# Patient Record
Sex: Female | Born: 1980 | Race: White | Hispanic: No | Marital: Married | Smoking: Never smoker
Health system: Southern US, Community
[De-identification: ages and names within clinical notes are randomized; demographics above are authoritative.]

## PROBLEM LIST (undated history)

## (undated) DIAGNOSIS — E611 Iron deficiency: Secondary | ICD-10-CM

## (undated) DIAGNOSIS — E559 Vitamin D deficiency, unspecified: Secondary | ICD-10-CM

## (undated) DIAGNOSIS — F419 Anxiety disorder, unspecified: Secondary | ICD-10-CM

## (undated) DIAGNOSIS — E7800 Pure hypercholesterolemia, unspecified: Secondary | ICD-10-CM

## (undated) DIAGNOSIS — B029 Zoster without complications: Secondary | ICD-10-CM

## (undated) DIAGNOSIS — F439 Reaction to severe stress, unspecified: Secondary | ICD-10-CM

## (undated) DIAGNOSIS — F32A Depression, unspecified: Secondary | ICD-10-CM

## (undated) DIAGNOSIS — E669 Obesity, unspecified: Secondary | ICD-10-CM

## (undated) DIAGNOSIS — F329 Major depressive disorder, single episode, unspecified: Secondary | ICD-10-CM

## (undated) DIAGNOSIS — K219 Gastro-esophageal reflux disease without esophagitis: Secondary | ICD-10-CM

## (undated) HISTORY — DX: Vitamin D deficiency, unspecified: E55.9

## (undated) HISTORY — DX: Reaction to severe stress, unspecified: F43.9

## (undated) HISTORY — DX: Iron deficiency: E61.1

## (undated) HISTORY — DX: Gastro-esophageal reflux disease without esophagitis: K21.9

## (undated) HISTORY — DX: Depression, unspecified: F32.A

## (undated) HISTORY — DX: Pure hypercholesterolemia, unspecified: E78.00

## (undated) HISTORY — PX: WISDOM TOOTH EXTRACTION: SHX21

## (undated) HISTORY — DX: Obesity, unspecified: E66.9

## (undated) HISTORY — DX: Major depressive disorder, single episode, unspecified: F32.9

## (undated) HISTORY — DX: Zoster without complications: B02.9

## (undated) HISTORY — DX: Anxiety disorder, unspecified: F41.9

---

## 2005-07-11 ENCOUNTER — Ambulatory Visit: Payer: Self-pay | Admitting: Family Medicine

## 2007-01-30 DIAGNOSIS — K219 Gastro-esophageal reflux disease without esophagitis: Secondary | ICD-10-CM | POA: Insufficient documentation

## 2007-01-31 DIAGNOSIS — B360 Pityriasis versicolor: Secondary | ICD-10-CM | POA: Insufficient documentation

## 2009-12-06 ENCOUNTER — Ambulatory Visit: Payer: Self-pay | Admitting: Obstetrics & Gynecology

## 2009-12-06 ENCOUNTER — Encounter: Payer: Self-pay | Admitting: Obstetrics and Gynecology

## 2009-12-06 LAB — CONVERTED CEMR LAB
Eosinophils Absolute: 0.1 10*3/uL (ref 0.0–0.7)
HCT: 39.9 % (ref 36.0–46.0)
Hepatitis B Surface Ag: NEGATIVE
Lymphocytes Relative: 20 % (ref 12–46)
MCHC: 31.1 g/dL (ref 30.0–36.0)
MCV: 98.3 fL (ref 78.0–100.0)
Neutrophils Relative %: 72 % (ref 43–77)
Platelets: 293 10*3/uL (ref 150–400)
Rh Type: POSITIVE

## 2009-12-07 ENCOUNTER — Ambulatory Visit (HOSPITAL_COMMUNITY): Admission: RE | Admit: 2009-12-07 | Discharge: 2009-12-07 | Payer: Self-pay | Admitting: Obstetrics and Gynecology

## 2009-12-14 ENCOUNTER — Ambulatory Visit: Payer: Self-pay | Admitting: Obstetrics and Gynecology

## 2010-01-04 ENCOUNTER — Ambulatory Visit (HOSPITAL_COMMUNITY): Admission: RE | Admit: 2010-01-04 | Discharge: 2010-01-04 | Payer: Self-pay | Admitting: Obstetrics and Gynecology

## 2010-01-11 ENCOUNTER — Ambulatory Visit: Payer: Self-pay | Admitting: Obstetrics & Gynecology

## 2010-02-08 ENCOUNTER — Ambulatory Visit: Payer: Self-pay | Admitting: Obstetrics and Gynecology

## 2010-02-15 ENCOUNTER — Ambulatory Visit (HOSPITAL_COMMUNITY): Admission: RE | Admit: 2010-02-15 | Discharge: 2010-02-15 | Payer: Self-pay | Admitting: Obstetrics and Gynecology

## 2010-03-08 ENCOUNTER — Ambulatory Visit: Payer: Self-pay | Admitting: Obstetrics and Gynecology

## 2010-04-05 ENCOUNTER — Ambulatory Visit: Payer: Self-pay | Admitting: Obstetrics & Gynecology

## 2010-04-19 ENCOUNTER — Ambulatory Visit: Payer: Self-pay | Admitting: Obstetrics & Gynecology

## 2010-04-19 LAB — CONVERTED CEMR LAB
HCT: 31 % — ABNORMAL LOW (ref 36.0–46.0)
MCV: 100.6 fL — ABNORMAL HIGH (ref 78.0–100.0)
Platelets: 255 10*3/uL (ref 150–400)
RBC: 3.08 M/uL — ABNORMAL LOW (ref 3.87–5.11)
RDW: 14.5 % (ref 11.5–15.5)
WBC: 8.9 10*3/uL (ref 4.0–10.5)

## 2010-04-25 ENCOUNTER — Encounter (INDEPENDENT_AMBULATORY_CARE_PROVIDER_SITE_OTHER): Payer: Self-pay | Admitting: *Deleted

## 2010-04-25 ENCOUNTER — Ambulatory Visit: Payer: Self-pay | Admitting: Obstetrics and Gynecology

## 2010-04-25 LAB — CONVERTED CEMR LAB: Glucose, Fasting: 42 mg/dL — CL (ref 70–99)

## 2010-05-03 ENCOUNTER — Ambulatory Visit: Payer: Self-pay | Admitting: Family Medicine

## 2010-05-18 ENCOUNTER — Ambulatory Visit: Payer: Self-pay | Admitting: Family Medicine

## 2010-06-07 ENCOUNTER — Ambulatory Visit: Payer: Self-pay | Admitting: Obstetrics & Gynecology

## 2010-06-23 ENCOUNTER — Ambulatory Visit: Payer: Self-pay | Admitting: Obstetrics & Gynecology

## 2010-06-24 ENCOUNTER — Encounter: Payer: Self-pay | Admitting: Obstetrics & Gynecology

## 2010-06-24 LAB — CONVERTED CEMR LAB: GC Probe Amp, Genital: NEGATIVE

## 2010-06-28 ENCOUNTER — Ambulatory Visit: Payer: Self-pay | Admitting: Obstetrics & Gynecology

## 2010-07-07 ENCOUNTER — Ambulatory Visit: Payer: Self-pay | Admitting: Obstetrics and Gynecology

## 2010-07-13 ENCOUNTER — Ambulatory Visit: Payer: Self-pay | Admitting: Family Medicine

## 2010-07-15 ENCOUNTER — Inpatient Hospital Stay (HOSPITAL_COMMUNITY): Admission: AD | Admit: 2010-07-15 | Discharge: 2010-07-18 | Payer: Self-pay | Admitting: Family Medicine

## 2010-07-15 ENCOUNTER — Ambulatory Visit: Payer: Self-pay | Admitting: Advanced Practice Midwife

## 2010-08-30 ENCOUNTER — Ambulatory Visit: Payer: Self-pay | Admitting: Family Medicine

## 2010-12-27 ENCOUNTER — Ambulatory Visit
Admission: RE | Admit: 2010-12-27 | Discharge: 2010-12-27 | Payer: Self-pay | Source: Home / Self Care | Attending: Family Medicine | Admitting: Family Medicine

## 2011-02-24 LAB — CBC
Hemoglobin: 12.1 g/dL (ref 12.0–15.0)
MCHC: 33.7 g/dL (ref 30.0–36.0)
MCHC: 35 g/dL (ref 30.0–36.0)
MCV: 96.6 fL (ref 78.0–100.0)
RBC: 3.57 MIL/uL — ABNORMAL LOW (ref 3.87–5.11)
RDW: 15.7 % — ABNORMAL HIGH (ref 11.5–15.5)
RDW: 15.9 % — ABNORMAL HIGH (ref 11.5–15.5)
WBC: 13.6 10*3/uL — ABNORMAL HIGH (ref 4.0–10.5)

## 2011-04-25 NOTE — Assessment & Plan Note (Signed)
Stacey Santiago, Stacey Santiago                  ACCOUNT NO.:  [id number]   MEDICAL RECORD NO.:  [id number]          PATIENT TYPE:  POB   LOCATION:  [facility] at [facility]         FACILITY:  [facility]   PHYSICIAN:  Tinnie Gens, MD        DATE OF BIRTH:  June 24, 1981   DATE OF SERVICE:  12/27/2010                                  CLINIC NOTE   CHIEF COMPLAINT:  Yearly exam.   HISTORY OF PRESENT ILLNESS:  The patient is a 30 year old gravida 1,  para 1 who approximately 4 months status post delivery of a female  infant.  She continues to breast feed.  She has had one cycle since she  delivered.  She is back at work full time and doing well.  She really  has no significant complaint today.   PAST MEDICAL HISTORY:  Significant for anemia.   PAST SURGICAL HISTORY:  Negative.   MEDICATIONS:  She is on Ortho Tri-Cyclen and prenatal vitamins daily.   ALLERGIES:  None known.   FAMILY HISTORY:  Significant for diabetes and coronary artery disease  but no breast, ovarian, colon, or prostate cancer.   SOCIAL HISTORY:  She works at [employer].  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   OBSTETRICAL HISTORY:  G1, P1, one vaginal delivery at 8 pounds baby.   GYN HISTORY:  Negative.  Tolerate AC well without difficulty.   REVIEW OF SYSTEMS:  Fourteen point review of system reviewed.  She  denies headache, vision changes, nausea, vomiting, diarrhea,  constipation, shortness of breath, chest pain, fevers, chills, swelling  of feet or ankles, and breast masses.   PHYSICAL EXAMINATION:  VITAL SIGNS:  On exam today, vitals are as noted  in the chart.  GENERAL:  She is a well-developed, well-nourished female in no acute  distress.  HEENT:  Normocephalic and atraumatic.  Sclerae anicteric.  NECK:  Supple.  Normal thyroid.  LUNGS:  Clear bilaterally.  CV:  Regular rate and rhythm.  No rubs, gallops, or murmurs.  ABDOMEN:  Soft, nontender, and nondistended.  EXTREMITIES:  No cyanosis,  clubbing, or edema.  Distal pulses 2+.  SKIN:  She has tinea versicolor noted in her abdomen and chest area.  BREASTS:  Symmetric with everted nipples.  No masses.  No  supraclavicular or axillary adenopathy.  GU:  Normal external female genitalia.  BUS normal.  Vagina is pink and  rugated.  Cervix is parous without lesion.  Uterus is small, anteverted.  No adnexal mass or tenderness noted.   IMPRESSION:  1. Gynecologic exam with Pap.  2. Undesired fertility.   PLAN:  1. Oral contraceptives are given to the patient today and did not do      any blood work as she is not over 30 yet.  Discussed when to start      planning conception of her next baby.  2. She was given a prescription for Ortho Tri-Cyclen.  3. Follow up in the ER or as needed.           ______________________________  Tinnie Gens, MD     TP/MEDQ  D:  12/27/2010  T:  12/28/2010  Job:  [id number]

## 2011-04-25 NOTE — Assessment & Plan Note (Signed)
NAMEANJANNETTE, [name]                  ACCOUNT NO.:  [id number]   MEDICAL RECORD NO.:  [id number]          PATIENT TYPE:  POB   LOCATION:  [facility] at [facility]         FACILITY:  [facility]   PHYSICIAN:  Tinnie Gens, MD        DATE OF BIRTH:  [birth year]   DATE OF SERVICE:  08/30/2010                                  CLINIC NOTE   CHIEF COMPLAINT:  Postpartum check.   HISTORY OF PRESENT ILLNESS:  The patient is a 30 year old, gravida 1 now  para 1 who is status post vaginal delivery female infant who was 8  pounds even.  She was breast-feeding which is going well.  She would  like oral contraceptives and types of acne for birth control.  She is  sleeping somewhat through the night maybe 5-6 hours.  She has no signs  or symptoms of depression.   PHYSICAL EXAMINATION:  VITAL SIGNS:  As noted in the chart.  GENERAL:  She is a well-developed, well-nourished female, in no acute  distress.  ABDOMEN:  Soft, nontender, nondistended.  GU:  Normal external female genitalia.  BUS is normal.  Vagina is pink  and rugated.  Cervix is parous without lesion.  Uterus is small,  anteverted.  No adnexal masses or tenderness.   IMPRESSION:  1. Postpartum check.  No signs of depression.  2. Breast feeding.  3. Desire for contraception.   PLAN:  The patient will return in January for a Pap smear.  She will get  back on Ortho Tri-Cyclen.  She was warned about decreasing __________  and ways to increase this as well as a discussion about IUD versus  Implanon was had with the patient.  She can start her pills today.  She  can follow up as needed.           ______________________________  Tinnie Gens, MD     TP/MEDQ  D:  08/30/2010  T:  08/31/2010  Job:  [id number]

## 2013-06-06 LAB — HM PAP SMEAR: HM Pap smear: NEGATIVE

## 2013-07-08 ENCOUNTER — Ambulatory Visit (INDEPENDENT_AMBULATORY_CARE_PROVIDER_SITE_OTHER): Payer: 59 | Admitting: Obstetrics & Gynecology

## 2013-07-08 ENCOUNTER — Other Ambulatory Visit: Payer: Self-pay | Admitting: Obstetrics & Gynecology

## 2013-07-08 ENCOUNTER — Encounter: Payer: Self-pay | Admitting: Obstetrics & Gynecology

## 2013-07-08 VITALS — BP 100/72 | Ht 65.0 in | Wt 189.0 lb

## 2013-07-08 DIAGNOSIS — Z3682 Encounter for antenatal screening for nuchal translucency: Secondary | ICD-10-CM

## 2013-07-08 DIAGNOSIS — Z3490 Encounter for supervision of normal pregnancy, unspecified, unspecified trimester: Secondary | ICD-10-CM | POA: Insufficient documentation

## 2013-07-08 DIAGNOSIS — Z3491 Encounter for supervision of normal pregnancy, unspecified, first trimester: Secondary | ICD-10-CM

## 2013-07-08 DIAGNOSIS — Z3480 Encounter for supervision of other normal pregnancy, unspecified trimester: Secondary | ICD-10-CM

## 2013-07-08 LAB — OB RESULTS CONSOLE RPR: RPR: NONREACTIVE

## 2013-07-08 LAB — OB RESULTS CONSOLE RUBELLA ANTIBODY, IGM: RUBELLA: IMMUNE

## 2013-07-08 LAB — OB RESULTS CONSOLE ABO/RH: RH Type: POSITIVE

## 2013-07-08 LAB — OB RESULTS CONSOLE HEPATITIS B SURFACE ANTIGEN: HEP B S AG: NEGATIVE

## 2013-07-08 LAB — OB RESULTS CONSOLE ANTIBODY SCREEN: ANTIBODY SCREEN: NEGATIVE

## 2013-07-08 LAB — OB RESULTS CONSOLE HIV ANTIBODY (ROUTINE TESTING): HIV: NONREACTIVE

## 2013-07-08 NOTE — Patient Instructions (Signed)
Pregnancy - First Trimester  During sexual intercourse, millions of sperm go into the vagina. Only 1 sperm will penetrate and fertilize the female egg while it is in the Fallopian tube. One week later, the fertilized egg implants into the wall of the uterus. An embryo begins to develop into a baby. At 6 to 8 weeks, the eyes and face are formed and the heartbeat can be seen on ultrasound. At the end of 12 weeks (first trimester), all the baby's organs are formed. Now that you are pregnant, you will want to do everything you can to have a healthy baby. Two of the most important things are to get good prenatal care and follow your caregiver's instructions. Prenatal care is all the medical care you receive before the baby's birth. It is given to prevent, find, and treat problems during the pregnancy and childbirth.  PRENATAL EXAMS  · During prenatal visits, your weight, blood pressure, and urine are checked. This is done to make sure you are healthy and progressing normally during the pregnancy.  · A pregnant woman should gain 25 to 35 pounds during the pregnancy. However, if you are overweight or underweight, your caregiver will advise you regarding your weight.  · Your caregiver will ask and answer questions for you.  · Blood work, cervical cultures, other necessary tests, and a Pap test are done during your prenatal exams. These tests are done to check on your health and the probable health of your baby. Tests are strongly recommended and done for HIV with your permission. This is the virus that causes AIDS. These tests are done because medicines can be given to help prevent your baby from being born with this infection should you have been infected without knowing it. Blood work is also used to find out your blood type, previous infections, and follow your blood levels (hemoglobin).  · Low hemoglobin (anemia) is common during pregnancy. Iron and vitamins are given to help prevent this. Later in the pregnancy, blood  tests for diabetes will be done along with any other tests if any problems develop.  · You may need other tests to make sure you and the baby are doing well.  CHANGES DURING THE FIRST TRIMESTER   Your body goes through many changes during pregnancy. They vary from person to person. Talk to your caregiver about changes you notice and are concerned about. Changes can include:  · Your menstrual period stops.  · The egg and sperm carry the genes that determine what you look like. Genes from you and your partner are forming a baby. The female genes determine whether the baby is a boy or a girl.  · Your body increases in girth and you may feel bloated.  · Feeling sick to your stomach (nauseous) and throwing up (vomiting). If the vomiting is uncontrollable, call your caregiver.  · Your breasts will begin to enlarge and become tender.  · Your nipples may stick out more and become darker.  · The need to urinate more. Painful urination may mean you have a bladder infection.  · Tiring easily.  · Loss of appetite.  · Cravings for certain kinds of food.  · At first, you may gain or lose a couple of pounds.  · You may have changes in your emotions from day to day (excited to be pregnant or concerned something may go wrong with the pregnancy and baby).  · You may have more vivid and strange dreams.  HOME CARE INSTRUCTIONS   ·   It is very important to avoid all smoking, alcohol and non-prescribed drugs during your pregnancy. These affect the formation and growth of the baby. Avoid chemicals while pregnant to ensure the delivery of a healthy infant.  · Start your prenatal visits by the 12th week of pregnancy. They are usually scheduled monthly at first, then more often in the last 2 months before delivery. Keep your caregiver's appointments. Follow your caregiver's instructions regarding medicine use, blood and lab tests, exercise, and diet.  · During pregnancy, you are providing food for you and your baby. Eat regular, well-balanced  meals. Choose foods such as meat, fish, milk and other low fat dairy products, vegetables, fruits, and whole-grain breads and cereals. Your caregiver will tell you of the ideal weight gain.  · You can help morning sickness by keeping soda crackers at the bedside. Eat a couple before arising in the morning. You may want to use the crackers without salt on them.  · Eating 4 to 5 small meals rather than 3 large meals a day also may help the nausea and vomiting.  · Drinking liquids between meals instead of during meals also seems to help nausea and vomiting.  · A physical sexual relationship may be continued throughout pregnancy if there are no other problems. Problems may be early (premature) leaking of amniotic fluid from the membranes, vaginal bleeding, or belly (abdominal) pain.  · Exercise regularly if there are no restrictions. Check with your caregiver or physical therapist if you are unsure of the safety of some of your exercises. Greater weight gain will occur in the last 2 trimesters of pregnancy. Exercising will help:  · Control your weight.  · Keep you in shape.  · Prepare you for labor and delivery.  · Help you lose your pregnancy weight after you deliver your baby.  · Wear a good support or jogging bra for breast tenderness during pregnancy. This may help if worn during sleep too.  · Ask when prenatal classes are available. Begin classes when they are offered.  · Do not use hot tubs, steam rooms, or saunas.  · Wear your seat belt when driving. This protects you and your baby if you are in an accident.  · Avoid raw meat, uncooked cheese, cat litter boxes, and soil used by cats throughout the pregnancy. These carry germs that can cause birth defects in the baby.  · The first trimester is a good time to visit your dentist for your dental health. Getting your teeth cleaned is okay. Use a softer toothbrush and brush gently during pregnancy.  · Ask for help if you have financial, counseling, or nutritional needs  during pregnancy. Your caregiver will be able to offer counseling for these needs as well as refer you for other special needs.  · Do not take any medicines or herbs unless told by your caregiver.  · Inform your caregiver if there is any mental or physical domestic violence.  · Make a list of emergency phone numbers of family, friends, hospital, and police and fire departments.  · Write down your questions. Take them to your prenatal visit.  · Do not douche.  · Do not cross your legs.  · If you have to stand for long periods of time, rotate you feet or take small steps in a circle.  · You may have more vaginal secretions that may require a sanitary pad. Do not use tampons or scented sanitary pads.  MEDICINES AND DRUG USE IN PREGNANCY  ·   Take prenatal vitamins as directed. The vitamin should contain 1 milligram of folic acid. Keep all vitamins out of reach of children. Only a couple vitamins or tablets containing iron may be fatal to a baby or young child when ingested.  · Avoid use of all medicines, including herbs, over-the-counter medicines, not prescribed or suggested by your caregiver. Only take over-the-counter or prescription medicines for pain, discomfort, or fever as directed by your caregiver. Do not use aspirin, ibuprofen, or naproxen unless directed by your caregiver.  · Let your caregiver also know about herbs you may be using.  · Alcohol is related to a number of birth defects. This includes fetal alcohol syndrome. All alcohol, in any form, should be avoided completely. Smoking will cause low birth rate and premature babies.  · Street or illegal drugs are very harmful to the baby. They are absolutely forbidden. A baby born to an addicted mother will be addicted at birth. The baby will go through the same withdrawal an adult does.  · Let your caregiver know about any medicines that you have to take and for what reason you take them.  SEEK MEDICAL CARE IF:   You have any concerns or worries during your  pregnancy. It is better to call with your questions if you feel they cannot wait, rather than worry about them.  SEEK IMMEDIATE MEDICAL CARE IF:   · An unexplained oral temperature above 102° F (38.9° C) develops, or as your caregiver suggests.  · You have leaking of fluid from the vagina (birth canal). If leaking membranes are suspected, take your temperature and inform your caregiver of this when you call.  · There is vaginal spotting or bleeding. Notify your caregiver of the amount and how many pads are used.  · You develop a bad smelling vaginal discharge with a change in the color.  · You continue to feel sick to your stomach (nauseated) and have no relief from remedies suggested. You vomit blood or coffee ground-like materials.  · You lose more than 2 pounds of weight in 1 week.  · You gain more than 2 pounds of weight in 1 week and you notice swelling of your face, hands, feet, or legs.  · You gain 5 pounds or more in 1 week (even if you do not have swelling of your hands, face, legs, or feet).  · You get exposed to German measles and have never had them.  · You are exposed to fifth disease or chickenpox.  · You develop belly (abdominal) pain. Round ligament discomfort is a common non-cancerous (benign) cause of abdominal pain in pregnancy. Your caregiver still must evaluate this.  · You develop headache, fever, diarrhea, pain with urination, or shortness of breath.  · You fall or are in a car accident or have any kind of trauma.  · There is mental or physical violence in your home.  Document Released: 11/21/2001 Document Revised: 08/21/2012 Document Reviewed: 05/25/2009  [employer]® Patient Information ©2014 [employer].

## 2013-07-08 NOTE — Progress Notes (Signed)
P - [phone] 

## 2013-07-08 NOTE — Progress Notes (Signed)
   Subjective:    Stacey Santiago is a G2P1001 [redacted]w[redacted]d being seen today for her first obstetrical visit.  Her obstetrical history is significant for one term SVD in 2011. Patient does intend to breast feed. Pregnancy history fully reviewed.  Patient reports no complaints.  Filed Vitals:   07/08/13 1045 07/08/13 1046  BP: 100/72   Height:  5\' 5"  (1.651 m)  Weight: 189 lb (85.73 kg)     HISTORY: OB History   Grav Para Term Preterm Abortions TAB SAB Ect Mult Living   2 1 1       1      # Outc Date GA Lbr Len/2nd Wgt Sex Del Anes PTL Lv   1 TRM 8/11   8lb(3.629kg) F SVD EPI No Yes   2 CUR              History reviewed. No pertinent past medical history. History reviewed. No pertinent past surgical history. Family History  Problem Relation Age of Onset  . Diabetes Mother   . Hypertension Mother   . Hyperlipidemia Mother   . Hypertension Father   . Hyperlipidemia Father   . Diabetes Maternal Grandfather      Exam    Uterus:     Pelvic Exam:    Perineum: No Hemorrhoids, Normal Perineum   Vulva: normal   Vagina:  normal mucosa, normal discharge   Cervix: multiparous appearance   Adnexa: normal adnexa and no mass, fullness, tenderness   Bony Pelvis: gynecoid and proven to 8 lbs  System: Breast:  normal appearance, no masses or tenderness   Skin: normal coloration and turgor, no rashes   Neurologic: oriented, normal   Extremities: normal strength, tone, and muscle mass   HEENT PERRLA   Mouth/Teeth mucous membranes moist, pharynx normal without lesions and dental hygiene good   Neck supple and no masses   Cardiovascular: regular rate and rhythm   Respiratory:  appears well, vitals normal, no respiratory distress, acyanotic, normal RR, chest clear, no wheezing, crepitations, rhonchi, normal symmetric air entry   Abdomen: soft, non-tender; bowel sounds normal; no masses,  no organomegaly   Urinary: urethral meatus normal   Bedside Ultrasound shows +cardiac activity    Assessment:    Pregnancy: G2P1001 Patient Active Problem List   Diagnosis Date Noted  . Supervision of normal pregnancy 07/08/2013     Plan:   Initial labs drawn. Continue prenatal vitamins. Problem list reviewed and updated. Genetic Screening discussed First Screen: ordered. Ultrasound discussed; fetal survey: will be ordered later. Follow up in 4 weeks   Little Bashore A, MD 07/08/2013 10:55 AM

## 2013-07-09 LAB — HIV ANTIBODY (ROUTINE TESTING W REFLEX): HIV 1/O/2 Abs-Index Value: <1 (ref <1.00)

## 2013-07-10 LAB — GC/CHLAMYDIA PROBE AMP
Chlamydia trachomatis, NAA: NEGATIVE
Neisseria gonorrhoeae by PCR: NEGATIVE

## 2013-07-14 LAB — PRENATAL PROFILE I(LABCORP)
Antibody Screen: NEGATIVE
Basophils Absolute: 0.1 10*3/uL (ref 0.0–0.2)
HCT: 37.1 % (ref 34.0–46.6)
Hemoglobin: 12.4 g/dL (ref 11.1–15.9)
Immature Granulocytes: 0 % (ref 0–2)
Lymphocytes Absolute: 1.7 10*3/uL (ref 0.7–3.1)
MCH: 30.6 pg (ref 26.6–33.0)
MCHC: 33.4 g/dL (ref 31.5–35.7)
MCV: 92 fL (ref 79–97)
Monocytes Absolute: 0.5 10*3/uL (ref 0.1–0.9)
Neutrophils Absolute: 4.6 10*3/uL (ref 1.4–7.0)
RBC: 4.05 x10E6/uL (ref 3.77–5.28)
RDW: 14.2 % (ref 12.3–15.4)
RPR: NONREACTIVE
WBC: 6.9 10*3/uL (ref 3.4–10.8)

## 2013-07-31 ENCOUNTER — Encounter (HOSPITAL_COMMUNITY): Payer: Self-pay | Admitting: Obstetrics & Gynecology

## 2013-08-05 ENCOUNTER — Ambulatory Visit (INDEPENDENT_AMBULATORY_CARE_PROVIDER_SITE_OTHER): Payer: 59 | Admitting: Family Medicine

## 2013-08-05 VITALS — BP 125/82 | Wt 191.0 lb

## 2013-08-05 DIAGNOSIS — Z3480 Encounter for supervision of other normal pregnancy, unspecified trimester: Secondary | ICD-10-CM

## 2013-08-05 DIAGNOSIS — Z3491 Encounter for supervision of normal pregnancy, unspecified, first trimester: Secondary | ICD-10-CM

## 2013-08-05 NOTE — Progress Notes (Signed)
Some nausea--not a lot of emesis. For first trimester screen on Friday.

## 2013-08-05 NOTE — Patient Instructions (Signed)
Pregnancy - Second Trimester The second trimester of pregnancy (3 to 6 months) is a period of rapid growth for you and your baby. At the end of the sixth month, your baby is about 9 inches long and weighs 1 1/2 pounds. You will begin to feel the baby move between 18 and 20 weeks of the pregnancy. This is called quickening. Weight gain is faster. A clear fluid (colostrum) may leak out of your breasts. You may feel small contractions of the womb (uterus). This is known as false labor or Braxton-Hicks contractions. This is like a practice for labor when the baby is ready to be born. Usually, the problems with morning sickness have usually passed by the end of your first trimester. Some women develop small dark blotches (called cholasma, mask of pregnancy) on their face that usually goes away after the baby is born. Exposure to the sun makes the blotches worse. Acne may also develop in some pregnant women and pregnant women who have acne, may find that it goes away. PRENATAL EXAMS  Blood work may continue to be done during prenatal exams. These tests are done to check on your health and the probable health of your baby. Blood work is used to follow your blood levels (hemoglobin). Anemia (low hemoglobin) is common during pregnancy. Iron and vitamins are given to help prevent this. You will also be checked for diabetes between 24 and 28 weeks of the pregnancy. Some of the previous blood tests may be repeated.  The size of the uterus is measured during each visit. This is to make sure that the baby is continuing to grow properly according to the dates of the pregnancy.  Your blood pressure is checked every prenatal visit. This is to make sure you are not getting toxemia.  Your urine is checked to make sure you do not have an infection, diabetes or protein in the urine.  Your weight is checked often to make sure gains are happening at the suggested rate. This is to ensure that both you and your baby are  growing normally.  Sometimes, an ultrasound is performed to confirm the proper growth and development of the baby. This is a test which bounces harmless sound waves off the baby so your caregiver can more accurately determine due dates. Sometimes, a test is done on the amniotic fluid surrounding the baby. This test is called an amniocentesis. The amniotic fluid is obtained by sticking a needle into the belly (abdomen). This is done to check the chromosomes in instances where there is a concern about possible genetic problems with the baby. It is also sometimes done near the end of pregnancy if an early delivery is required. In this case, it is done to help make sure the baby's lungs are mature enough for the baby to live outside of the womb. CHANGES OCCURING IN THE SECOND TRIMESTER OF PREGNANCY Your body goes through many changes during pregnancy. They vary from person to person. Talk to your caregiver about changes you notice that you are concerned about.  During the second trimester, you will likely have an increase in your appetite. It is normal to have cravings for certain foods. This varies from person to person and pregnancy to pregnancy.  Your lower abdomen will begin to bulge.  You may have to urinate more often because the uterus and baby are pressing on your bladder. It is also common to get more bladder infections during pregnancy. You can help this by drinking lots of fluids   and emptying your bladder before and after intercourse.  You may begin to get stretch marks on your hips, abdomen, and breasts. These are normal changes in the body during pregnancy. There are no exercises or medicines to take that prevent this change.  You may begin to develop swollen and bulging veins (varicose veins) in your legs. Wearing support hose, elevating your feet for 15 minutes, 3 to 4 times a day and limiting salt in your diet helps lessen the problem.  Heartburn may develop as the uterus grows and  pushes up against the stomach. Antacids recommended by your caregiver helps with this problem. Also, eating smaller meals 4 to 5 times a day helps.  Constipation can be treated with a stool softener or adding bulk to your diet. Drinking lots of fluids, and eating vegetables, fruits, and whole grains are helpful.  Exercising is also helpful. If you have been very active up until your pregnancy, most of these activities can be continued during your pregnancy. If you have been less active, it is helpful to start an exercise program such as walking.  Hemorrhoids may develop at the end of the second trimester. Warm sitz baths and hemorrhoid cream recommended by your caregiver helps hemorrhoid problems.  Backaches may develop during this time of your pregnancy. Avoid heavy lifting, wear low heal shoes, and practice good posture to help with backache problems.  Some pregnant women develop tingling and numbness of their hand and fingers because of swelling and tightening of ligaments in the wrist (carpel tunnel syndrome). This goes away after the baby is born.  As your breasts enlarge, you may have to get a bigger bra. Get a comfortable, cotton, support bra. Do not get a nursing bra until the last month of the pregnancy if you will be nursing the baby.  You may get a dark line from your belly button to the pubic area called the linea nigra.  You may develop rosy cheeks because of increase blood flow to the face.  You may develop spider looking lines of the face, neck, arms, and chest. These go away after the baby is born. HOME CARE INSTRUCTIONS   It is extremely important to avoid all smoking, herbs, alcohol, and unprescribed drugs during your pregnancy. These chemicals affect the formation and growth of the baby. Avoid these chemicals throughout the pregnancy to ensure the delivery of a healthy infant.  Most of your home care instructions are the same as suggested for the first trimester of your  pregnancy. Keep your caregiver's appointments. Follow your caregiver's instructions regarding medicine use, exercise, and diet.  During pregnancy, you are providing food for you and your baby. Continue to eat regular, well-balanced meals. Choose foods such as meat, fish, milk and other low fat dairy products, vegetables, fruits, and whole-grain breads and cereals. Your caregiver will tell you of the ideal weight gain.  A physical sexual relationship may be continued up until near the end of pregnancy if there are no other problems. Problems could include early (premature) leaking of amniotic fluid from the membranes, vaginal bleeding, abdominal pain, or other medical or pregnancy problems.  Exercise regularly if there are no restrictions. Check with your caregiver if you are unsure of the safety of some of your exercises. The greatest weight gain will occur in the last 2 trimesters of pregnancy. Exercise will help you:  Control your weight.  Get you in shape for labor and delivery.  Lose weight after you have the baby.  Wear   a good support or jogging bra for breast tenderness during pregnancy. This may help if worn during sleep. Pads or tissues may be used in the bra if you are leaking colostrum.  Do not use hot tubs, steam rooms or saunas throughout the pregnancy.  Wear your seat belt at all times when driving. This protects you and your baby if you are in an accident.  Avoid raw meat, uncooked cheese, cat litter boxes, and soil used by cats. These carry germs that can cause birth defects in the baby.  The second trimester is also a good time to visit your dentist for your dental health if this has not been done yet. Getting your teeth cleaned is okay. Use a soft toothbrush. Brush gently during pregnancy.  It is easier to leak urine during pregnancy. Tightening up and strengthening the pelvic muscles will help with this problem. Practice stopping your urination while you are going to the  bathroom. These are the same muscles you need to strengthen. It is also the muscles you would use as if you were trying to stop from passing gas. You can practice tightening these muscles up 10 times a set and repeating this about 3 times per day. Once you know what muscles to tighten up, do not perform these exercises during urination. It is more likely to contribute to an infection by backing up the urine.  Ask for help if you have financial, counseling, or nutritional needs during pregnancy. Your caregiver will be able to offer counseling for these needs as well as refer you for other special needs.  Your skin may become oily. If so, wash your face with mild soap, use non-greasy moisturizer and oil or cream based makeup. MEDICINES AND DRUG USE IN PREGNANCY  Take prenatal vitamins as directed. The vitamin should contain 1 milligram of folic acid. Keep all vitamins out of reach of children. Only a couple vitamins or tablets containing iron may be fatal to a baby or young child when ingested.  Avoid use of all medicines, including herbs, over-the-counter medicines, not prescribed or suggested by your caregiver. Only take over-the-counter or prescription medicines for pain, discomfort, or fever as directed by your caregiver. Do not use aspirin.  Let your caregiver also know about herbs you may be using.  Alcohol is related to a number of birth defects. This includes fetal alcohol syndrome. All alcohol, in any form, should be avoided completely. Smoking will cause low birth rate and premature babies.  Street or illegal drugs are very harmful to the baby. They are absolutely forbidden. A baby born to an addicted mother will be addicted at birth. The baby will go through the same withdrawal an adult does. SEEK MEDICAL CARE IF:  You have any concerns or worries during your pregnancy. It is better to call with your questions if you feel they cannot wait, rather than worry about them. SEEK IMMEDIATE  MEDICAL CARE IF:   An unexplained oral temperature above 102 F (38.9 C) develops, or as your caregiver suggests.  You have leaking of fluid from the vagina (birth canal). If leaking membranes are suspected, take your temperature and tell your caregiver of this when you call.  There is vaginal spotting, bleeding, or passing clots. Tell your caregiver of the amount and how many pads are used. Light spotting in pregnancy is common, especially following intercourse.  You develop a bad smelling vaginal discharge with a change in the color from clear to white.  You continue to feel   sick to your stomach (nauseated) and have no relief from remedies suggested. You vomit blood or coffee ground-like materials.  You lose more than 2 pounds of weight or gain more than 2 pounds of weight over 1 week, or as suggested by your caregiver.  You notice swelling of your face, hands, feet, or legs.  You get exposed to German measles and have never had them.  You are exposed to fifth disease or chickenpox.  You develop belly (abdominal) pain. Round ligament discomfort is a common non-cancerous (benign) cause of abdominal pain in pregnancy. Your caregiver still must evaluate you.  You develop a bad headache that does not go away.  You develop fever, diarrhea, pain with urination, or shortness of breath.  You develop visual problems, blurry, or double vision.  You fall or are in a car accident or any kind of trauma.  There is mental or physical violence at home. Document Released: 11/21/2001 Document Revised: 08/21/2012 Document Reviewed: 05/26/2009 [employer] Patient Information 2014 [employer].  Breastfeeding A change in hormones during your pregnancy causes growth of your breast tissue and an increase in number and size of milk ducts. The hormone prolactin allows proteins, sugars, and fats from your blood supply to make breast milk in your milk-producing glands. The hormone progesterone prevents  breast milk from being released before the birth of your baby. After the birth of your baby, your progesterone level decreases allowing breast milk to be released. Thoughts of your baby, as well as his or her sucking or crying, can stimulate the release of milk from the milk-producing glands. Deciding to breastfeed (nurse) is one of the best choices you can make for you and your baby. The information that follows gives a brief review of the benefits, as well as other important skills to know about breastfeeding. BENEFITS OF BREASTFEEDING For your baby  The first milk (colostrum) helps your baby's digestive system function better.   There are antibodies in your milk that help your baby fight off infections.   Your baby has a lower incidence of asthma, allergies, and sudden infant death syndrome (SIDS).   The nutrients in breast milk are better for your baby than infant formulas.  Breast milk improves your baby's brain development.   Your baby will have less gas, colic, and constipation.  Your baby is less likely to develop other conditions, such as childhood obesity, asthma, or diabetes mellitus. For you  Breastfeeding helps develop a very special bond between you and your baby.   Breastfeeding is convenient, always available at the correct temperature, and costs nothing.   Breastfeeding helps to burn calories and helps you lose the weight gained during pregnancy.   Breastfeeding makes your uterus contract back down to normal size faster and slows bleeding following delivery.   Breastfeeding mothers have a lower risk of developing osteoporosis or breast or ovarian cancer later in life.  BREASTFEEDING FREQUENCY  A healthy, full-term baby may breastfeed as often as every hour or space his or her feedings to every 3 hours. Breastfeeding frequency will vary from baby to baby.   Newborns should be fed no less than every 2 3 hours during the day and every 4 5 hours during the  night. You should breastfeed a minimum of 8 feedings in a 24 hour period.  Awaken your baby to breastfeed if it has been 3 4 hours since the last feeding.  Breastfeed when you feel the need to reduce the fullness of your breasts or when   your newborn shows signs of hunger. Signs that your baby may be hungry include:  Increased alertness or activity.  Stretching.  Movement of the head from side to side.  Movement of the head and opening of the mouth when the corner of the mouth or cheek is stroked (rooting).  Increased sucking sounds, smacking lips, cooing, sighing, or squeaking.  Hand-to-mouth movements.  Increased sucking of fingers or hands.  Fussing.  Intermittent crying.  Signs of extreme hunger will require calming and consoling before you try to feed your baby. Signs of extreme hunger may include:  Restlessness.  A loud, strong cry.  Screaming.  Frequent feeding will help you make more milk and will help prevent problems, such as sore nipples and engorgement of the breasts.  BREASTFEEDING   Whether lying down or sitting, be sure that the baby's abdomen is facing your abdomen.   Support your breast with 4 fingers under your breast and your thumb above your nipple. Make sure your fingers are well away from your nipple and your baby's mouth.   Stroke your baby's lips gently with your finger or nipple.   When your baby's mouth is open wide enough, place all of your nipple and as much of the colored area around your nipple (areola) as possible into your baby's mouth.  More areola should be visible above his or her upper lip than below his or her lower lip.  Your baby's tongue should be between his or her lower gum and your breast.  Ensure that your baby's mouth is correctly positioned around the nipple (latched). Your baby's lips should create a seal on your breast.  Signs that your baby has effectively latched onto your nipple include:  Tugging or sucking  without pain.  Swallowing heard between sucks.  Absent click or smacking sound.  Muscle movement above and in front of his or her ears with sucking.  Your baby must suck about 2 3 minutes in order to get your milk. Allow your baby to feed on each breast as long as he or she wants. Nurse your baby until he or she unlatches or falls asleep at the first breast, then offer the second breast.  Signs that your baby is full and satisfied include:  A gradual decrease in the number of sucks or complete cessation of sucking.  Falling asleep.  Extension or relaxation of his or her body.  Retention of a small amount of milk in his or her mouth.  Letting go of your breast by himself or herself.  Signs of effective breastfeeding in you include:  Breasts that have increased firmness, weight, and size prior to feeding.  Breasts that are softer after nursing.  Increased milk volume, as well as a change in milk consistency and color by the 5th day of breastfeeding.  Breast fullness relieved by breastfeeding.  Nipples are not sore, cracked, or bleeding.  If needed, break the suction by putting your finger into the corner of your baby's mouth and sliding your finger between his or her gums. Then, remove your breast from his or her mouth.  It is common for babies to spit up a small amount after a feeding.  Babies often swallow air during feeding. This can make babies fussy. Burping your baby between breasts can help with this.  Vitamin D supplements are recommended for babies who get only breast milk.  Avoid using a pacifier during your baby's first 4 6 weeks.  Avoid supplemental feedings of water, formula, or   juice in place of breastfeeding. Breast milk is all the food your baby needs. It is not necessary for your baby to have water or formula. Your breasts will make more milk if supplemental feedings are avoided during the early weeks. HOW TO TELL WHETHER YOUR BABY IS GETTING ENOUGH BREAST  MILK Wondering whether or not your baby is getting enough milk is a common concern among mothers. You can be assured that your baby is getting enough milk if:   Your baby is actively sucking and you hear swallowing.   Your baby seems relaxed and satisfied after a feeding.   Your baby nurses at least 8 12 times in a 24 hour time period.  During the first 3 5 days of age:  Your baby is wetting at least 3 5 diapers in a 24 hour period. The urine should be clear and pale yellow.  Your baby is having at least 3 4 stools in a 24 hour period. The stool should be soft and yellow.  At 5 7 days of age, your baby is having at least 3 6 stools in a 24 hour period. The stool should be seedy and yellow by 5 days of age.  Your baby has a weight loss less than 7 10% during the first 3 days of age.  Your baby does not lose weight after 3 7 days of age.  Your baby gains 4 7 ounces each week after he or she is 4 days of age.  Your baby gains weight by 5 days of age and is back to birth weight within 2 weeks. ENGORGEMENT In the first week after your baby is born, you may experience extremely full breasts (engorgement). When engorged, your breasts may feel heavy, warm, or tender to the touch. Engorgement peaks within 24 48 hours after delivery of your baby.  Engorgement may be reduced by:  Continuing to breastfeed.  Increasing the frequency of breastfeeding.  Taking warm showers or applying warm, moist heat to your breasts just before each feeding. This increases circulation and helps the milk flow.   Gently massaging your breast before and during the feedings. With your fingertips, massage from your chest wall towards your nipple in a circular motion.   Ensuring that your baby empties at least one breast at every feeding. It also helps to start the next feeding on the opposite breast.   Expressing breast milk by hand or by using a breast pump to empty the breasts if your baby is sleepy, or  not nursing well. You may also want to express milk if you are returning to work oryou feel you are getting engorged.  Ensuring your baby is latched on and positioned properly while breastfeeding. If you follow these suggestions, your engorgement should improve in 24 48 hours. If you are still experiencing difficulty, call your lactation consultant or caregiver.  CARING FOR YOURSELF Take care of your breasts.  Bathe or shower daily.   Avoid using soap on your nipples.   Wear a supportive bra. Avoid wearing underwire style bras.  Air dry your nipples for a 3 4minutes after each feeding.   Use only cotton bra pads to absorb breast milk leakage. Leaking of breast milk between feedings is normal.   Use only pure lanolin on your nipples after nursing. You do not need to wash it off before feeding your baby again. Another option is to express a few drops of breast milk and gently massage that milk into your nipples.  Continue   breast self-awareness checks. Take care of yourself.  Eat healthy foods. Alternate 3 meals with 3 snacks.  Avoid foods that you notice affect your baby in a bad way.  Drink milk, fruit juice, and water to satisfy your thirst (about 8 glasses a day).   Rest often, relax, and take your prenatal vitamins to prevent fatigue, stress, and anemia.  Avoid chewing and smoking tobacco.  Avoid alcohol and drug use.  Take over-the-counter and prescribed medicine only as directed by your caregiver or pharmacist. You should always check with your caregiver or pharmacist before taking any new medicine, vitamin, or herbal supplement.  Know that pregnancy is possible while breastfeeding. If desired, talk to your caregiver about family planning and safe birth control methods that may be used while breastfeeding. SEEK MEDICAL CARE IF:   You feel like you want to stop breastfeeding or have become frustrated with breastfeeding.  You have painful breasts or nipples.  Your  nipples are cracked or bleeding.  Your breasts are red, tender, or warm.  You have a swollen area on either breast.  You have a fever or chills.  You have nausea or vomiting.  You have drainage from your nipples.  Your breasts do not become full before feedings by the 5th day after delivery.  You feel sad and depressed.  Your baby is too sleepy to eat well.  Your baby is having trouble sleeping.   Your baby is wetting less than 3 diapers in a 24 hour period.  Your baby has less than 3 stools in a 24 hour period.  Your baby's skin or the white part of his or her eyes becomes more yellow.   Your baby is not gaining weight by 5 days of age. MAKE SURE YOU:   Understand these instructions.  Will watch your condition.  Will get help right away if you are not doing well or get worse. Document Released: 11/27/2005 Document Revised: 08/21/2012 Document Reviewed: 07/03/2012 [employer] Patient Information 2014 [employer].  

## 2013-08-05 NOTE — Progress Notes (Signed)
P-[id number] 

## 2013-08-08 ENCOUNTER — Other Ambulatory Visit: Payer: Self-pay

## 2013-08-08 ENCOUNTER — Ambulatory Visit (HOSPITAL_COMMUNITY)
Admission: RE | Admit: 2013-08-08 | Discharge: 2013-08-08 | Disposition: A | Discharge status: Home / Self Care | Payer: 59 | Source: Ambulatory Visit | Attending: Family Medicine | Admitting: Family Medicine

## 2013-08-08 DIAGNOSIS — O3510X0 Maternal care for (suspected) chromosomal abnormality in fetus, unspecified, not applicable or unspecified: Secondary | ICD-10-CM | POA: Insufficient documentation

## 2013-08-08 DIAGNOSIS — Z3682 Encounter for antenatal screening for nuchal translucency: Secondary | ICD-10-CM

## 2013-08-08 DIAGNOSIS — O351XX0 Maternal care for (suspected) chromosomal abnormality in fetus, not applicable or unspecified: Secondary | ICD-10-CM | POA: Insufficient documentation

## 2013-08-08 DIAGNOSIS — Z3689 Encounter for other specified antenatal screening: Secondary | ICD-10-CM | POA: Insufficient documentation

## 2013-08-08 NOTE — Progress Notes (Signed)
Toniya Spindel  was seen today for an ultrasound appointment.  See full report in [facility].  Impression: Single IUP at 12 3/7 weeks Normal NT (1.3 mm) First trimester aneuploidy screen performed as noted above.    Recommendations: Please do not draw triple/quad screen, though patient should be offered [facility]SAFP for neural tube defect screening.  Recommend ultrasound for anatomy at approximately [redacted] weeks gestation.  Alpha Gula, MD

## 2013-08-12 ENCOUNTER — Encounter: Payer: Self-pay | Admitting: Obstetrics & Gynecology

## 2013-09-02 ENCOUNTER — Encounter: Payer: 59 | Admitting: Family Medicine

## 2013-09-03 ENCOUNTER — Encounter: Payer: Self-pay | Admitting: Obstetrics and Gynecology

## 2013-09-03 ENCOUNTER — Ambulatory Visit (INDEPENDENT_AMBULATORY_CARE_PROVIDER_SITE_OTHER): Payer: 59 | Admitting: Obstetrics and Gynecology

## 2013-09-03 VITALS — BP 92/64 | Wt 190.0 lb

## 2013-09-03 DIAGNOSIS — Z3480 Encounter for supervision of other normal pregnancy, unspecified trimester: Secondary | ICD-10-CM

## 2013-09-03 DIAGNOSIS — Z3492 Encounter for supervision of normal pregnancy, unspecified, second trimester: Secondary | ICD-10-CM

## 2013-09-03 NOTE — Progress Notes (Signed)
Patient doing well without complaints. Anatomy ultrasound ordered for week of 10/13. AFP next visit

## 2013-09-03 NOTE — Progress Notes (Signed)
P-[id number] 

## 2013-09-26 ENCOUNTER — Other Ambulatory Visit: Payer: Self-pay | Admitting: Obstetrics and Gynecology

## 2013-09-26 ENCOUNTER — Ambulatory Visit (HOSPITAL_COMMUNITY): Admission: RE | Admit: 2013-09-26 | Payer: 59 | Source: Ambulatory Visit

## 2013-09-26 ENCOUNTER — Ambulatory Visit (HOSPITAL_COMMUNITY)
Admission: RE | Admit: 2013-09-26 | Discharge: 2013-09-26 | Disposition: A | Discharge status: Home / Self Care | Payer: 59 | Source: Ambulatory Visit | Attending: Obstetrics and Gynecology | Admitting: Obstetrics and Gynecology

## 2013-09-26 DIAGNOSIS — Z3689 Encounter for other specified antenatal screening: Secondary | ICD-10-CM | POA: Insufficient documentation

## 2013-09-26 DIAGNOSIS — Z3492 Encounter for supervision of normal pregnancy, unspecified, second trimester: Secondary | ICD-10-CM

## 2013-09-29 ENCOUNTER — Encounter: Payer: Self-pay | Admitting: Obstetrics and Gynecology

## 2013-10-01 ENCOUNTER — Ambulatory Visit (INDEPENDENT_AMBULATORY_CARE_PROVIDER_SITE_OTHER): Payer: 59 | Admitting: Obstetrics & Gynecology

## 2013-10-01 VITALS — BP 100/69 | Wt 192.0 lb

## 2013-10-01 DIAGNOSIS — Z3492 Encounter for supervision of normal pregnancy, unspecified, second trimester: Secondary | ICD-10-CM

## 2013-10-01 DIAGNOSIS — Z23 Encounter for immunization: Secondary | ICD-10-CM

## 2013-10-01 DIAGNOSIS — Z3480 Encounter for supervision of other normal pregnancy, unspecified trimester: Secondary | ICD-10-CM

## 2013-10-01 NOTE — Progress Notes (Signed)
P - [phone] 

## 2013-10-01 NOTE — Patient Instructions (Addendum)
Return to clinic for any obstetric concerns or go to [facility] for evaluation  Thank you for enrolling in MyChart. Please follow the instructions below to securely access your online medical record. MyChart allows you to send messages to your doctor, view your test results, manage appointments, and more.   How Do I Sign Up? 1. In your Internet browser, go to [employer] and enter [email or web address]. 2. Click on the Sign Up Now link in the Sign In box. You will see the New Member Sign Up page. 3. Enter your MyChart Access Code exactly as it appears below. You will not need to use this code after you've completed the sign-up process. If you do not sign up before the expiration date, you must request a new code.  MyChart Access Code: [email or web address] Expires: 11/30/2013  9:19 AM  4. Enter your Social Security Number ([id number]) and Date of Birth (mm/dd/yyyy) as indicated and click Submit. You will be taken to the next sign-up page. 5. Create a MyChart ID. This will be your MyChart login ID and cannot be changed, so think of one that is secure and easy to remember. 6. Create a MyChart password. You can change your password at any time. 7. Enter your Password Reset Question and Answer. This can be used at a later time if you forget your password.  8. Enter your e-mail address. You will receive e-mail notification when new information is available in MyChart. 9. Click Sign Up. You can now view your medical record.   Additional Information Remember, MyChart is NOT to be used for urgent needs. For medical emergencies, dial 911.

## 2013-10-01 NOTE — Progress Notes (Signed)
Anatomy scan normal. Flu vaccine today. No other complaints or concerns.  Routine obstetric precautions reviewed.

## 2013-10-29 ENCOUNTER — Encounter: Payer: Self-pay | Admitting: Obstetrics & Gynecology

## 2013-10-29 ENCOUNTER — Encounter: Payer: 59 | Admitting: Obstetrics & Gynecology

## 2013-10-29 ENCOUNTER — Ambulatory Visit (INDEPENDENT_AMBULATORY_CARE_PROVIDER_SITE_OTHER): Payer: 59 | Admitting: Obstetrics & Gynecology

## 2013-10-29 VITALS — BP 99/71 | Wt 196.2 lb

## 2013-10-29 DIAGNOSIS — Z3492 Encounter for supervision of normal pregnancy, unspecified, second trimester: Secondary | ICD-10-CM

## 2013-10-29 DIAGNOSIS — Z3480 Encounter for supervision of other normal pregnancy, unspecified trimester: Secondary | ICD-10-CM

## 2013-10-29 NOTE — Patient Instructions (Addendum)
Return to clinic for any obstetric concerns or go to [facility] for evaluation Tetanus, Diphtheria, Pertussis (Tdap) Vaccine What You Need to Know WHY GET VACCINATED? Tetanus, diphtheria and pertussis can be very serious diseases, even for adolescents and adults. Tdap vaccine can protect us from these diseases. TETANUS (Lockjaw) causes painful muscle tightening and stiffness, usually all over the body.  It can lead to tightening of muscles in the head and neck so you can't open your mouth, swallow, or sometimes even breathe. Tetanus kills about 1 out of 5 people who are infected. DIPHTHERIA can cause a thick coating to form in the back of the throat.  It can lead to breathing problems, paralysis, heart failure, and death. PERTUSSIS (Whooping Cough) causes severe coughing spells, which can cause difficulty breathing, vomiting and disturbed sleep.  It can also lead to weight loss, incontinence, and rib fractures. Up to 2 in 100 adolescents and 5 in 100 adults with pertussis are hospitalized or have complications, which could include pneumonia and death. These diseases are caused by bacteria. Diphtheria and pertussis are spread from person to person through coughing or sneezing. Tetanus enters the body through cuts, scratches, or wounds. Before vaccines, the [place] saw as many as 200,000 cases a year of diphtheria and pertussis, and hundreds of cases of tetanus. Since vaccination began, tetanus and diphtheria have dropped by about 99% and pertussis by about 80%. TDAP VACCINE Tdap vaccine can protect adolescents and adults from tetanus, diphtheria, and pertussis. One dose of Tdap is routinely given at age 11 or 12. People who did not get Tdap at that age should get it as soon as possible. Tdap is especially important for health care professionals and anyone having close contact with a baby younger than 12 months. Pregnant women should get a dose of Tdap during every pregnancy, to protect the newborn  from pertussis. Infants are most at risk for severe, life-threatening complications from pertussis. A similar vaccine, called Td, protects from tetanus and diphtheria, but not pertussis. A Td booster should be given every 10 years. Tdap may be given as one of these boosters if you have not already gotten a dose. Tdap may also be given after a severe cut or burn to prevent tetanus infection. Your doctor can give you more information. Tdap may safely be given at the same time as other vaccines. SOME PEOPLE SHOULD NOT GET THIS VACCINE  If you ever had a life-threatening allergic reaction after a dose of any tetanus, diphtheria, or pertussis containing vaccine, OR if you have a severe allergy to any part of this vaccine, you should not get Tdap. Tell your doctor if you have any severe allergies.  If you had a coma, or long or multiple seizures within 7 days after a childhood dose of DTP or DTaP, you should not get Tdap, unless a cause other than the vaccine was found. You can still get Td.  Talk to your doctor if you:  have epilepsy or another nervous system problem,  had severe pain or swelling after any vaccine containing diphtheria, tetanus or pertussis,  ever had Guillain-Barr Syndrome (GBS),  aren't feeling well on the day the shot is scheduled. RISKS OF A VACCINE REACTION With any medicine, including vaccines, there is a chance of side effects. These are usually mild and go away on their own, but serious reactions are also possible. Brief fainting spells can follow a vaccination, leading to injuries from falling. Sitting or lying down for about 15 minutes can   help prevent these. Tell your doctor if you feel dizzy or light-headed, or have vision changes or ringing in the ears. Mild problems following Tdap (Did not interfere with activities)  Pain where the shot was given (about 3 in 4 adolescents or 2 in 3 adults)  Redness or swelling where the shot was given (about 1 person in 5)  Mild  fever of at least 100.4F (up to about 1 in 25 adolescents or 1 in 100 adults)  Headache (about 3 or 4 people in 10)  Tiredness (about 1 person in 3 or 4)  Nausea, vomiting, diarrhea, stomach ache (up to 1 in 4 adolescents or 1 in 10 adults)  Chills, body aches, sore joints, rash, swollen glands (uncommon) Moderate problems following Tdap (Interfered with activities, but did not require medical attention)  Pain where the shot was given (about 1 in 5 adolescents or 1 in 100 adults)  Redness or swelling where the shot was given (up to about 1 in 16 adolescents or 1 in 25 adults)  Fever over 102F (about 1 in 100 adolescents or 1 in 250 adults)  Headache (about 3 in 20 adolescents or 1 in 10 adults)  Nausea, vomiting, diarrhea, stomach ache (up to 1 or 3 people in 100)  Swelling of the entire arm where the shot was given (up to about 3 in 100). Severe problems following Tdap (Unable to perform usual activities, required medical attention)  Swelling, severe pain, bleeding and redness in the arm where the shot was given (rare). A severe allergic reaction could occur after any vaccine (estimated less than 1 in a million doses). WHAT IF THERE IS A SERIOUS REACTION? What should I look for?  Look for anything that concerns you, such as signs of a severe allergic reaction, very high fever, or behavior changes. Signs of a severe allergic reaction can include hives, swelling of the face and throat, difficulty breathing, a fast heartbeat, dizziness, and weakness. These would start a few minutes to a few hours after the vaccination. What should I do?  If you think it is a severe allergic reaction or other emergency that can't wait, call 9-1-1 or get the person to the nearest hospital. Otherwise, call your doctor.  Afterward, the reaction should be reported to the "Vaccine Adverse Event Reporting System" (VAERS). Your doctor might file this report, or you can do it yourself through the VAERS web  site at www.vaers.hhs.gov, or by calling 1-800-822-7967. VAERS is only for reporting reactions. They do not give medical advice.  THE NATIONAL VACCINE INJURY COMPENSATION PROGRAM The National Vaccine Injury Compensation Program (VICP) is a federal program that was created to compensate people who may have been injured by certain vaccines. Persons who believe they may have been injured by a vaccine can learn about the program and about filing a claim by calling 1-800-338-2382 or visiting the VICP website at www.hrsa.gov/vaccinecompensation. HOW CAN I LEARN MORE?  Ask your doctor.  Call your local or state health department.  Contact the Centers for Disease Control and Prevention (CDC):  Call 1-800-232-4636 or visit CDC's website at www.cdc.gov/vaccines. CDC Tdap Vaccine VIS (04/18/12) Document Released: 05/28/2012 Document Revised: 03/24/2013 Document Reviewed: 03/19/2013 [employer] Patient Information 2014 [employer].  

## 2013-10-29 NOTE — Progress Notes (Signed)
Counseled about Tdap, will get at next visit along with third trimester labs.  No other complaints or concerns.  Routine obstetric precautions reviewed.

## 2013-10-29 NOTE — Progress Notes (Signed)
P-75 

## 2013-11-26 ENCOUNTER — Ambulatory Visit (INDEPENDENT_AMBULATORY_CARE_PROVIDER_SITE_OTHER): Payer: 59 | Admitting: Obstetrics and Gynecology

## 2013-11-26 ENCOUNTER — Encounter: Payer: Self-pay | Admitting: Obstetrics and Gynecology

## 2013-11-26 VITALS — BP 100/73 | Wt 198.0 lb

## 2013-11-26 DIAGNOSIS — Z3493 Encounter for supervision of normal pregnancy, unspecified, third trimester: Secondary | ICD-10-CM

## 2013-11-26 DIAGNOSIS — Z3480 Encounter for supervision of other normal pregnancy, unspecified trimester: Secondary | ICD-10-CM

## 2013-11-26 MED ORDER — TETANUS-DIPHTH-ACELL PERTUSSIS 5-2.5-18.5 LF-MCG/0.5 IM SUSP
0.5000 mL | Freq: Once | INTRAMUSCULAR | Status: AC
  Administered 2013-12-25: 0.5 mL via INTRAMUSCULAR

## 2013-11-26 NOTE — Progress Notes (Signed)
Patient doing well without complaints. FM/PTL precautions reviewed. 1hr GCT, labs and Tdap today

## 2013-11-26 NOTE — Progress Notes (Signed)
P= 80 

## 2013-11-27 ENCOUNTER — Encounter: Payer: Self-pay | Admitting: Obstetrics and Gynecology

## 2013-11-27 LAB — CBC
HCT: 31.5 % — ABNORMAL LOW (ref 34.0–46.6)
MCH: 30.8 pg (ref 26.6–33.0)
MCHC: 33.7 g/dL (ref 31.5–35.7)
MCV: 92 fL (ref 79–97)
RDW: 14.1 % (ref 12.3–15.4)

## 2013-11-27 LAB — RPR: RPR: NONREACTIVE

## 2013-12-11 NOTE — L&D Delivery Note (Signed)
Delivery Note At 4:39 AM a viable female was delivered via Vaginal, Spontaneous Delivery (Presentation: ; Occiput Anterior).  APGAR: 8, 9; weight TBD.   Placenta status: Intact, Spontaneous.  Cord: 3 vessels with the following complications: None.    Anesthesia: Epidural  Episiotomy: None Lacerations: 2nd degree Suture Repair: 3.0 vicryl rapide Est. Blood Loss (mL): 400cc  Mom to postpartum.  Baby to Couplet care / Skin to Skin.   Pt pushed with good maternal effort to deliver a liveborn female via NSVD.Marland Kitchen. Cry with stimulation. Cord clamped and cut. Placenta delivered intact with 3V cord with traction and pit. 2nd degree tear repaired in usual fashion. EBL 400.  No complications. Baby to skin to skin and mom to postpartum.   Lucifer Soja L 02/21/2014, 5:11 AM

## 2013-12-25 ENCOUNTER — Encounter: Payer: Self-pay | Admitting: Obstetrics & Gynecology

## 2013-12-25 ENCOUNTER — Ambulatory Visit (INDEPENDENT_AMBULATORY_CARE_PROVIDER_SITE_OTHER): Payer: 59 | Admitting: Obstetrics & Gynecology

## 2013-12-25 VITALS — BP 116/68 | Wt 201.0 lb

## 2013-12-25 DIAGNOSIS — Z3490 Encounter for supervision of normal pregnancy, unspecified, unspecified trimester: Secondary | ICD-10-CM

## 2013-12-25 DIAGNOSIS — Z3480 Encounter for supervision of other normal pregnancy, unspecified trimester: Secondary | ICD-10-CM

## 2013-12-25 DIAGNOSIS — Z23 Encounter for immunization: Secondary | ICD-10-CM

## 2013-12-25 NOTE — Progress Notes (Signed)
Routine visit. Good FM. No problems. TDAP today. Plans to use POP until husband gets vasectomy. "Stacey Santiago"

## 2013-12-25 NOTE — Progress Notes (Signed)
P-71.  Tdap today.

## 2014-01-05 ENCOUNTER — Encounter: Payer: Self-pay | Admitting: Obstetrics & Gynecology

## 2014-01-05 ENCOUNTER — Ambulatory Visit (INDEPENDENT_AMBULATORY_CARE_PROVIDER_SITE_OTHER): Payer: 59 | Admitting: Obstetrics & Gynecology

## 2014-01-05 VITALS — BP 106/68 | Wt 204.0 lb

## 2014-01-05 DIAGNOSIS — Z3480 Encounter for supervision of other normal pregnancy, unspecified trimester: Secondary | ICD-10-CM

## 2014-01-05 DIAGNOSIS — Z3490 Encounter for supervision of normal pregnancy, unspecified, unspecified trimester: Secondary | ICD-10-CM

## 2014-01-05 NOTE — Patient Instructions (Signed)
Return to clinic for any obstetric concerns or go to [facility] for evaluation  

## 2014-01-05 NOTE — Progress Notes (Signed)
P - 81 

## 2014-01-05 NOTE — Progress Notes (Signed)
Pelvic cultures next visit. No other complaints or concerns. Will watch fundal height. Fetal movement and labor precautions reviewed.

## 2014-01-21 ENCOUNTER — Other Ambulatory Visit: Payer: Self-pay | Admitting: Obstetrics and Gynecology

## 2014-01-21 ENCOUNTER — Ambulatory Visit (INDEPENDENT_AMBULATORY_CARE_PROVIDER_SITE_OTHER): Payer: 59 | Admitting: Obstetrics and Gynecology

## 2014-01-21 ENCOUNTER — Encounter: Payer: Self-pay | Admitting: Obstetrics and Gynecology

## 2014-01-21 VITALS — BP 111/78 | Wt 207.0 lb

## 2014-01-21 DIAGNOSIS — Z3480 Encounter for supervision of other normal pregnancy, unspecified trimester: Secondary | ICD-10-CM

## 2014-01-21 DIAGNOSIS — Z3490 Encounter for supervision of normal pregnancy, unspecified, unspecified trimester: Secondary | ICD-10-CM

## 2014-01-21 DIAGNOSIS — Z3493 Encounter for supervision of normal pregnancy, unspecified, third trimester: Secondary | ICD-10-CM

## 2014-01-21 LAB — OB RESULTS CONSOLE GC/CHLAMYDIA
Chlamydia: NEGATIVE
Gonorrhea: NEGATIVE

## 2014-01-21 NOTE — Progress Notes (Signed)
Patient is doing well without complaints. FM/PTL precautions reviewed. Cultures collected today

## 2014-01-21 NOTE — Progress Notes (Signed)
P = 87 

## 2014-01-26 ENCOUNTER — Ambulatory Visit (INDEPENDENT_AMBULATORY_CARE_PROVIDER_SITE_OTHER): Payer: 59 | Admitting: Obstetrics & Gynecology

## 2014-01-26 ENCOUNTER — Encounter: Payer: Self-pay | Admitting: Obstetrics & Gynecology

## 2014-01-26 VITALS — BP 117/84 | Wt 211.0 lb

## 2014-01-26 DIAGNOSIS — Z3480 Encounter for supervision of other normal pregnancy, unspecified trimester: Secondary | ICD-10-CM

## 2014-01-26 DIAGNOSIS — Z3490 Encounter for supervision of normal pregnancy, unspecified, unspecified trimester: Secondary | ICD-10-CM

## 2014-01-26 LAB — OB RESULTS CONSOLE GBS: GBS: POSITIVE

## 2014-01-26 NOTE — Progress Notes (Signed)
Routine visit. Good FM. Labor precautions review. No problems. GC/CT negative but GBS not actually run. I re obtained the GBS culture today.

## 2014-01-26 NOTE — Addendum Note (Signed)
Addended by: Tandy GawHINTON, Zaineb Nowaczyk C on: 01/26/2014 02:51 PM   Modules accepted: Orders

## 2014-01-26 NOTE — Progress Notes (Signed)
P-72

## 2014-01-27 ENCOUNTER — Encounter: Payer: Self-pay | Admitting: Obstetrics & Gynecology

## 2014-01-28 ENCOUNTER — Encounter: Payer: Self-pay | Admitting: Obstetrics & Gynecology

## 2014-01-31 LAB — CULTURE, BETA STREP (GROUP B ONLY)

## 2014-02-04 ENCOUNTER — Encounter: Payer: Self-pay | Admitting: Obstetrics & Gynecology

## 2014-02-04 ENCOUNTER — Encounter: Payer: Self-pay | Admitting: Obstetrics and Gynecology

## 2014-02-04 ENCOUNTER — Ambulatory Visit (INDEPENDENT_AMBULATORY_CARE_PROVIDER_SITE_OTHER): Payer: 59 | Admitting: Obstetrics and Gynecology

## 2014-02-04 VITALS — BP 99/71 | Wt 208.4 lb

## 2014-02-04 DIAGNOSIS — Z3480 Encounter for supervision of other normal pregnancy, unspecified trimester: Secondary | ICD-10-CM

## 2014-02-04 DIAGNOSIS — Z3490 Encounter for supervision of normal pregnancy, unspecified, unspecified trimester: Secondary | ICD-10-CM

## 2014-02-04 NOTE — Progress Notes (Signed)
Patient is doing well without complaints. Cervical exam unchanged. FM/labor precautions reviewed

## 2014-02-04 NOTE — Progress Notes (Signed)
p-85 

## 2014-02-05 LAB — GC/CHLAMYDIA PROBE AMP
CHLAMYDIA, DNA PROBE: NEGATIVE
NEISSERIA GONORRHOEAE BY PCR: NEGATIVE

## 2014-02-05 LAB — SPECIMEN STATUS REPORT

## 2014-02-05 LAB — CULTURE, BETA STREP (GROUP B ONLY)

## 2014-02-11 ENCOUNTER — Ambulatory Visit (INDEPENDENT_AMBULATORY_CARE_PROVIDER_SITE_OTHER): Payer: 59 | Admitting: Obstetrics & Gynecology

## 2014-02-11 VITALS — BP 110/80 | Wt 209.0 lb

## 2014-02-11 DIAGNOSIS — Z3490 Encounter for supervision of normal pregnancy, unspecified, unspecified trimester: Secondary | ICD-10-CM

## 2014-02-11 DIAGNOSIS — Z3480 Encounter for supervision of other normal pregnancy, unspecified trimester: Secondary | ICD-10-CM

## 2014-02-11 NOTE — Patient Instructions (Signed)
Return to clinic for any obstetric concerns or go to [facility] for evaluation  

## 2014-02-11 NOTE — Progress Notes (Signed)
P-78 

## 2014-02-11 NOTE — Progress Notes (Signed)
No complaints or concerns.  Fetal movement and labor precautions reviewed. Start postdates testing next week if still pregnant.

## 2014-02-16 ENCOUNTER — Ambulatory Visit (INDEPENDENT_AMBULATORY_CARE_PROVIDER_SITE_OTHER): Payer: 59 | Admitting: Family Medicine

## 2014-02-16 ENCOUNTER — Encounter: Payer: Self-pay | Admitting: Family Medicine

## 2014-02-16 VITALS — BP 108/79 | Wt 209.0 lb

## 2014-02-16 DIAGNOSIS — O480 Post-term pregnancy: Secondary | ICD-10-CM

## 2014-02-16 DIAGNOSIS — Z3480 Encounter for supervision of other normal pregnancy, unspecified trimester: Secondary | ICD-10-CM

## 2014-02-16 DIAGNOSIS — Z3490 Encounter for supervision of normal pregnancy, unspecified, unspecified trimester: Secondary | ICD-10-CM

## 2014-02-16 NOTE — Progress Notes (Signed)
NST reviewed and reactive. Continue post-dates testing. Membranes stripped Schedule IOL at 41 wks.

## 2014-02-16 NOTE — Progress Notes (Signed)
P-84  

## 2014-02-16 NOTE — Patient Instructions (Signed)
Third Trimester of Pregnancy The third trimester is from week 29 through week 42, months 7 through 9. The third trimester is a time when the fetus is growing rapidly. At the end of the ninth month, the fetus is about 20 inches in length and weighs 6 10 pounds.  BODY CHANGES Your body goes through many changes during pregnancy. The changes vary from woman to woman.   Your weight will continue to increase. You can expect to gain 25 35 pounds (11 16 kg) by the end of the pregnancy.  You may begin to get stretch marks on your hips, abdomen, and breasts.  You may urinate more often because the fetus is moving lower into your pelvis and pressing on your bladder.  You may develop or continue to have heartburn as a result of your pregnancy.  You may develop constipation because certain hormones are causing the muscles that push waste through your intestines to slow down.  You may develop hemorrhoids or swollen, bulging veins (varicose veins).  You may have pelvic pain because of the weight gain and pregnancy hormones relaxing your joints between the bones in your pelvis. Back aches may result from over exertion of the muscles supporting your posture.  Your breasts will continue to grow and be tender. A yellow discharge may leak from your breasts called colostrum.  Your belly button may stick out.  You may feel short of breath because of your expanding uterus.  You may notice the fetus "dropping," or moving lower in your abdomen.  You may have a bloody mucus discharge. This usually occurs a few days to a week before labor begins.  Your cervix becomes thin and soft (effaced) near your due date. WHAT TO EXPECT AT YOUR PRENATAL EXAMS  You will have prenatal exams every 2 weeks until week 36. Then, you will have weekly prenatal exams. During a routine prenatal visit:  You will be weighed to make sure you and the fetus are growing normally.  Your blood pressure is taken.  Your abdomen will  be measured to track your baby's growth.  The fetal heartbeat will be listened to.  Any test results from the previous visit will be discussed.  You may have a cervical check near your due date to see if you have effaced. At around 36 weeks, your caregiver will check your cervix. At the same time, your caregiver will also perform a test on the secretions of the vaginal tissue. This test is to determine if a type of bacteria, Group B streptococcus, is present. Your caregiver will explain this further. Your caregiver may ask you:  What your birth plan is.  How you are feeling.  If you are feeling the baby move.  If you have had any abnormal symptoms, such as leaking fluid, bleeding, severe headaches, or abdominal cramping.  If you have any questions. Other tests or screenings that may be performed during your third trimester include:  Blood tests that check for low iron levels (anemia).  Fetal testing to check the health, activity level, and growth of the fetus. Testing is done if you have certain medical conditions or if there are problems during the pregnancy. FALSE LABOR You may feel small, irregular contractions that eventually go away. These are called Braxton Hicks contractions, or false labor. Contractions may last for hours, days, or even weeks before true labor sets in. If contractions come at regular intervals, intensify, or become painful, it is best to be seen by your caregiver.    SIGNS OF LABOR   Menstrual-like cramps.  Contractions that are 5 minutes apart or less.  Contractions that start on the top of the uterus and spread down to the lower abdomen and back.  A sense of increased pelvic pressure or back pain.  A watery or bloody mucus discharge that comes from the vagina. If you have any of these signs before the 37th week of pregnancy, call your caregiver right away. You need to go to the hospital to get checked immediately. HOME CARE INSTRUCTIONS   Avoid all  smoking, herbs, alcohol, and unprescribed drugs. These chemicals affect the formation and growth of the baby.  Follow your caregiver's instructions regarding medicine use. There are medicines that are either safe or unsafe to take during pregnancy.  Exercise only as directed by your caregiver. Experiencing uterine cramps is a good sign to stop exercising.  Continue to eat regular, healthy meals.  Wear a good support bra for breast tenderness.  Do not use hot tubs, steam rooms, or saunas.  Wear your seat belt at all times when driving.  Avoid raw meat, uncooked cheese, cat litter boxes, and soil used by cats. These carry germs that can cause birth defects in the baby.  Take your prenatal vitamins.  Try taking a stool softener (if your caregiver approves) if you develop constipation. Eat more high-fiber foods, such as fresh vegetables or fruit and whole grains. Drink plenty of fluids to keep your urine clear or pale yellow.  Take warm sitz baths to soothe any pain or discomfort caused by hemorrhoids. Use hemorrhoid cream if your caregiver approves.  If you develop varicose veins, wear support hose. Elevate your feet for 15 minutes, 3 4 times a day. Limit salt in your diet.  Avoid heavy lifting, wear low heal shoes, and practice good posture.  Rest a lot with your legs elevated if you have leg cramps or low back pain.  Visit your dentist if you have not gone during your pregnancy. Use a soft toothbrush to brush your teeth and be gentle when you floss.  A sexual relationship may be continued unless your caregiver directs you otherwise.  Do not travel far distances unless it is absolutely necessary and only with the approval of your caregiver.  Take prenatal classes to understand, practice, and ask questions about the labor and delivery.  Make a trial run to the hospital.  Pack your hospital bag.  Prepare the baby's nursery.  Continue to go to all your prenatal visits as directed  by your caregiver. SEEK MEDICAL CARE IF:  You are unsure if you are in labor or if your water has broken.  You have dizziness.  You have mild pelvic cramps, pelvic pressure, or nagging pain in your abdominal area.  You have persistent nausea, vomiting, or diarrhea.  You have a bad smelling vaginal discharge.  You have pain with urination. SEEK IMMEDIATE MEDICAL CARE IF:   You have a fever.  You are leaking fluid from your vagina.  You have spotting or bleeding from your vagina.  You have severe abdominal cramping or pain.  You have rapid weight loss or gain.  You have shortness of breath with chest pain.  You notice sudden or extreme swelling of your face, hands, ankles, feet, or legs.  You have not felt your baby move in over an hour.  You have severe headaches that do not go away with medicine.  You have vision changes. Document Released: 11/21/2001 Document Revised: 07/30/2013 Document Reviewed:   01/28/2013 [employer] Patient Information 2014 [employer].  Breastfeeding Deciding to breastfeed is one of the best choices you can make for you and your baby. A change in hormones during pregnancy causes your breast tissue to grow and increases the number and size of your milk ducts. These hormones also allow proteins, sugars, and fats from your blood supply to make breast milk in your milk-producing glands. Hormones prevent breast milk from being released before your baby is born as well as prompt milk flow after birth. Once breastfeeding has begun, thoughts of your baby, as well as his or her sucking or crying, can stimulate the release of milk from your milk-producing glands.  BENEFITS OF BREASTFEEDING For Your Baby  Your first milk (colostrum) helps your baby's digestive system function better.   There are antibodies in your milk that help your baby fight off infections.   Your baby has a lower incidence of asthma, allergies, and sudden infant death syndrome.    The nutrients in breast milk are better for your baby than infant formulas and are designed uniquely for your baby's needs.   Breast milk improves your baby's brain development.   Your baby is less likely to develop other conditions, such as childhood obesity, asthma, or type 2 diabetes mellitus.  For You   Breastfeeding helps to create a very special bond between you and your baby.   Breastfeeding is convenient. Breast milk is always available at the correct temperature and costs nothing.   Breastfeeding helps to burn calories and helps you lose the weight gained during pregnancy.   Breastfeeding makes your uterus contract to its prepregnancy size faster and slows bleeding (lochia) after you give birth.   Breastfeeding helps to lower your risk of developing type 2 diabetes mellitus, osteoporosis, and breast or ovarian cancer later in life. SIGNS THAT YOUR BABY IS HUNGRY Early Signs of Hunger  Increased alertness or activity.  Stretching.  Movement of the head from side to side.  Movement of the head and opening of the mouth when the corner of the mouth or cheek is stroked (rooting).  Increased sucking sounds, smacking lips, cooing, sighing, or squeaking.  Hand-to-mouth movements.  Increased sucking of fingers or hands. Late Signs of Hunger  Fussing.  Intermittent crying. Extreme Signs of Hunger Signs of extreme hunger will require calming and consoling before your baby will be able to breastfeed successfully. Do not wait for the following signs of extreme hunger to occur before you initiate breastfeeding:   Restlessness.  A loud, strong cry.   Screaming. BREASTFEEDING BASICS Breastfeeding Initiation  Find a comfortable place to sit or lie down, with your neck and back well supported.  Place a pillow or rolled up blanket under your baby to bring him or her to the level of your breast (if you are seated). Nursing pillows are specially designed to help  support your arms and your baby while you breastfeed.  Make sure that your baby's abdomen is facing your abdomen.   Gently massage your breast. With your fingertips, massage from your chest wall toward your nipple in a circular motion. This encourages milk flow. You may need to continue this action during the feeding if your milk flows slowly.  Support your breast with 4 fingers underneath and your thumb above your nipple. Make sure your fingers are well away from your nipple and your baby's mouth.   Stroke your baby's lips gently with your finger or nipple.   When your baby's mouth is   open wide enough, quickly bring your baby to your breast, placing your entire nipple and as much of the colored area around your nipple (areola) as possible into your baby's mouth.   More areola should be visible above your baby's upper lip than below the lower lip.   Your baby's tongue should be between his or her lower gum and your breast.   Ensure that your baby's mouth is correctly positioned around your nipple (latched). Your baby's lips should create a seal on your breast and be turned out (everted).  It is common for your baby to suck about 2 3 minutes in order to start the flow of breast milk. Latching Teaching your baby how to latch on to your breast properly is very important. An improper latch can cause nipple pain and decreased milk supply for you and poor weight gain in your baby. Also, if your baby is not latched onto your nipple properly, he or she may swallow some air during feeding. This can make your baby fussy. Burping your baby when you switch breasts during the feeding can help to get rid of the air. However, teaching your baby to latch on properly is still the best way to prevent fussiness from swallowing air while breastfeeding. Signs that your baby has successfully latched on to your nipple:    Silent tugging or silent sucking, without causing you pain.   Swallowing heard  between every 3 4 sucks.    Muscle movement above and in front of his or her ears while sucking.  Signs that your baby has not successfully latched on to nipple:   Sucking sounds or smacking sounds from your baby while breastfeeding.  Nipple pain. If you think your baby has not latched on correctly, slip your finger into the corner of your baby's mouth to break the suction and place it between your baby's gums. Attempt breastfeeding initiation again. Signs of Successful Breastfeeding Signs from your baby:   A gradual decrease in the number of sucks or complete cessation of sucking.   Falling asleep.   Relaxation of his or her body.   Retention of a small amount of milk in his or her mouth.   Letting go of your breast by himself or herself. Signs from you:  Breasts that have increased in firmness, weight, and size 1 3 hours after feeding.   Breasts that are softer immediately after breastfeeding.  Increased milk volume, as well as a change in milk consistency and color by the 5th day of breastfeeding.   Nipples that are not sore, cracked, or bleeding. Signs That Your Baby is Getting Enough Milk  Wetting at least 3 diapers in a 24-hour period. The urine should be clear and pale yellow by age 5 days.  At least 3 stools in a 24-hour period by age 5 days. The stool should be soft and yellow.  At least 3 stools in a 24-hour period by age 7 days. The stool should be seedy and yellow.  No loss of weight greater than 10% of birth weight during the first 3 days of age.  Average weight gain of 4 7 ounces (120 210 mL) per week after age 4 days.  Consistent daily weight gain by age 5 days, without weight loss after the age of 2 weeks. After a feeding, your baby may spit up a small amount. This is common. BREASTFEEDING FREQUENCY AND DURATION Frequent feeding will help you make more milk and can prevent sore nipples and breast engorgement.   Breastfeed when you feel the need to  reduce the fullness of your breasts or when your baby shows signs of hunger. This is called "breastfeeding on demand." Avoid introducing a pacifier to your baby while you are working to establish breastfeeding (the first 4 6 weeks after your baby is born). After this time you may choose to use a pacifier. Research has shown that pacifier use during the first year of a baby's life decreases the risk of sudden infant death syndrome (SIDS). Allow your baby to feed on each breast as long as he or she wants. Breastfeed until your baby is finished feeding. When your baby unlatches or falls asleep while feeding from the first breast, offer the second breast. Because newborns are often sleepy in the first few weeks of life, you may need to awaken your baby to get him or her to feed. Breastfeeding times will vary from baby to baby. However, the following rules can serve as a guide to help you ensure that your baby is properly fed:  Newborns (babies 4 weeks of age or younger) may breastfeed every 1 3 hours.  Newborns should not go longer than 3 hours during the day or 5 hours during the night without breastfeeding.  You should breastfeed your baby a minimum of 8 times in a 24-hour period until you begin to introduce solid foods to your baby at around 6 months of age. BREAST MILK PUMPING Pumping and storing breast milk allows you to ensure that your baby is exclusively fed your breast milk, even at times when you are unable to breastfeed. This is especially important if you are going back to work while you are still breastfeeding or when you are not able to be present during feedings. Your lactation consultant can give you guidelines on how long it is safe to store breast milk.  A breast pump is a machine that allows you to pump milk from your breast into a sterile bottle. The pumped breast milk can then be stored in a refrigerator or freezer. Some breast pumps are operated by hand, while others use electricity. Ask  your lactation consultant which type will work best for you. Breast pumps can be purchased, but some hospitals and breastfeeding support groups lease breast pumps on a monthly basis. A lactation consultant can teach you how to hand express breast milk, if you prefer not to use a pump.  CARING FOR YOUR BREASTS WHILE YOU BREASTFEED Nipples can become dry, cracked, and sore while breastfeeding. The following recommendations can help keep your breasts moisturized and healthy:  Avoid using soap on your nipples.   Wear a supportive bra. Although not required, special nursing bras and tank tops are designed to allow access to your breasts for breastfeeding without taking off your entire bra or top. Avoid wearing underwire style bras or extremely tight bras.  Air dry your nipples for 3 4minutes after each feeding.   Use only cotton bra pads to absorb leaked breast milk. Leaking of breast milk between feedings is normal.   Use lanolin on your nipples after breastfeeding. Lanolin helps to maintain your skin's normal moisture barrier. If you use pure lanolin you do not need to wash it off before feeding your baby again. Pure lanolin is not toxic to your baby. You may also hand express a few drops of breast milk and gently massage that milk into your nipples and allow the milk to air dry. In the first few weeks after giving birth, some women   experience extremely full breasts (engorgement). Engorgement can make your breasts feel heavy, warm, and tender to the touch. Engorgement peaks within 3 5 days after you give birth. The following recommendations can help ease engorgement:  Completely empty your breasts while breastfeeding or pumping. You may want to start by applying warm, moist heat (in the shower or with warm water-soaked hand towels) just before feeding or pumping. This increases circulation and helps the milk flow. If your baby does not completely empty your breasts while breastfeeding, pump any extra  milk after he or she is finished.  Wear a snug bra (nursing or regular) or tank top for 1 2 days to signal your body to slightly decrease milk production.  Apply ice packs to your breasts, unless this is too uncomfortable for you.  Make sure that your baby is latched on and positioned properly while breastfeeding. If engorgement persists after 48 hours of following these recommendations, contact your health care provider or a lactation consultant. OVERALL HEALTH CARE RECOMMENDATIONS WHILE BREASTFEEDING  Eat healthy foods. Alternate between meals and snacks, eating 3 of each per day. Because what you eat affects your breast milk, some of the foods may make your baby more irritable than usual. Avoid eating these foods if you are sure that they are negatively affecting your baby.  Drink milk, fruit juice, and water to satisfy your thirst (about 10 glasses a day).   Rest often, relax, and continue to take your prenatal vitamins to prevent fatigue, stress, and anemia.  Continue breast self-awareness checks.  Avoid chewing and smoking tobacco.  Avoid alcohol and drug use. Some medicines that may be harmful to your baby can pass through breast milk. It is important to ask your health care provider before taking any medicine, including all over-the-counter and prescription medicine as well as vitamin and herbal supplements. It is possible to become pregnant while breastfeeding. If birth control is desired, ask your health care provider about options that will be safe for your baby. SEEK MEDICAL CARE IF:   You feel like you want to stop breastfeeding or have become frustrated with breastfeeding.  You have painful breasts or nipples.  Your nipples are cracked or bleeding.  Your breasts are red, tender, or warm.  You have a swollen area on either breast.  You have a fever or chills.  You have nausea or vomiting.  You have drainage other than breast milk from your nipples.  Your breasts  do not become full before feedings by the 5th day after you give birth.  You feel sad and depressed.  Your baby is too sleepy to eat well.  Your baby is having trouble sleeping.   Your baby is wetting less than 3 diapers in a 24-hour period.  Your baby has less than 3 stools in a 24-hour period.  Your baby's skin or the white part of his or her eyes becomes yellow.   Your baby is not gaining weight by 5 days of age. SEEK IMMEDIATE MEDICAL CARE IF:   Your baby is overly tired (lethargic) and does not want to wake up and feed.  Your baby develops an unexplained fever. Document Released: 11/27/2005 Document Revised: 07/30/2013 Document Reviewed: 05/21/2013 [employer] Patient Information 2014 [employer].  

## 2014-02-19 ENCOUNTER — Ambulatory Visit: Payer: 59 | Admitting: Obstetrics and Gynecology

## 2014-02-19 ENCOUNTER — Ambulatory Visit (INDEPENDENT_AMBULATORY_CARE_PROVIDER_SITE_OTHER): Payer: 59 | Admitting: Obstetrics and Gynecology

## 2014-02-19 ENCOUNTER — Encounter: Payer: Self-pay | Admitting: Obstetrics and Gynecology

## 2014-02-19 ENCOUNTER — Other Ambulatory Visit: Payer: 59 | Admitting: Obstetrics and Gynecology

## 2014-02-19 VITALS — BP 105/82 | Wt 209.0 lb

## 2014-02-19 DIAGNOSIS — O480 Post-term pregnancy: Secondary | ICD-10-CM

## 2014-02-19 DIAGNOSIS — Z3490 Encounter for supervision of normal pregnancy, unspecified, unspecified trimester: Secondary | ICD-10-CM

## 2014-02-19 DIAGNOSIS — Z3480 Encounter for supervision of other normal pregnancy, unspecified trimester: Secondary | ICD-10-CM

## 2014-02-19 NOTE — Progress Notes (Signed)
Patient is doing well without complaints. FM/labor precautions reviewed. Membranes stripped. Scheduled for IOL on 3/17. NST reviewed and reactive. AFI WNL

## 2014-02-20 ENCOUNTER — Inpatient Hospital Stay (HOSPITAL_COMMUNITY)
Admission: AD | Admit: 2014-02-20 | Discharge: 2014-02-22 | DRG: 775 | Disposition: A | Discharge status: Home / Self Care | Payer: 59 | Source: Ambulatory Visit | Attending: Family Medicine | Admitting: Family Medicine

## 2014-02-20 ENCOUNTER — Encounter (HOSPITAL_COMMUNITY): Payer: Self-pay | Admitting: *Deleted

## 2014-02-20 ENCOUNTER — Telehealth (HOSPITAL_COMMUNITY): Payer: Self-pay | Admitting: *Deleted

## 2014-02-20 DIAGNOSIS — O411090 Infection of amniotic sac and membranes, unspecified, unspecified trimester, not applicable or unspecified: Secondary | ICD-10-CM | POA: Diagnosis present

## 2014-02-20 DIAGNOSIS — Z22330 Carrier of Group B streptococcus: Secondary | ICD-10-CM

## 2014-02-20 DIAGNOSIS — O9989 Other specified diseases and conditions complicating pregnancy, childbirth and the puerperium: Secondary | ICD-10-CM

## 2014-02-20 DIAGNOSIS — Z3490 Encounter for supervision of normal pregnancy, unspecified, unspecified trimester: Secondary | ICD-10-CM

## 2014-02-20 DIAGNOSIS — O99892 Other specified diseases and conditions complicating childbirth: Secondary | ICD-10-CM | POA: Diagnosis present

## 2014-02-20 LAB — CBC
HEMATOCRIT: 35.2 % — AB (ref 36.0–46.0)
Hemoglobin: 12 g/dL (ref 12.0–15.0)
MCH: 30.9 pg (ref 26.0–34.0)
MCHC: 34.1 g/dL (ref 30.0–36.0)
MCV: 90.7 fL (ref 78.0–100.0)
Platelets: 238 10*3/uL (ref 150–400)
RBC: 3.88 MIL/uL (ref 3.87–5.11)
RDW: 15.1 % (ref 11.5–15.5)
WBC: 15.1 10*3/uL — ABNORMAL HIGH (ref 4.0–10.5)

## 2014-02-20 MED ORDER — ONDANSETRON HCL 4 MG/2ML IJ SOLN: 4.0000 mg | Freq: Four times a day (QID) | INTRAMUSCULAR | Status: DC | PRN

## 2014-02-20 MED ORDER — FENTANYL 2.5 MCG/ML BUPIVACAINE 1/10 % EPIDURAL INFUSION (WH - ANES)
14.0000 mL/h | INTRAMUSCULAR | Status: DC | PRN
  Administered 2014-02-21: 14 mL/h via EPIDURAL
  Filled 2014-02-20: qty 125

## 2014-02-20 MED ORDER — PHENYLEPHRINE 40 MCG/ML (10ML) SYRINGE FOR IV PUSH (FOR BLOOD PRESSURE SUPPORT)
80.0000 ug | PREFILLED_SYRINGE | INTRAVENOUS | Status: DC | PRN
  Filled 2014-02-20: qty 2

## 2014-02-20 MED ORDER — DIPHENHYDRAMINE HCL 50 MG/ML IJ SOLN: 12.5000 mg | INTRAMUSCULAR | Status: DC | PRN

## 2014-02-20 MED ORDER — CITRIC ACID-SODIUM CITRATE 334-500 MG/5ML PO SOLN: 30.0000 mL | ORAL | Status: DC | PRN

## 2014-02-20 MED ORDER — IBUPROFEN 600 MG PO TABS
600.0000 mg | ORAL_TABLET | Freq: Four times a day (QID) | ORAL | Status: DC | PRN
  Administered 2014-02-21 (×2): 600 mg via ORAL
  Filled 2014-02-20: qty 1

## 2014-02-20 MED ORDER — EPHEDRINE 5 MG/ML INJ
10.0000 mg | INTRAVENOUS | Status: DC | PRN
  Filled 2014-02-20: qty 4
  Filled 2014-02-20: qty 2

## 2014-02-20 MED ORDER — FLEET ENEMA 7-19 GM/118ML RE ENEM: 1.0000 | ENEMA | RECTAL | Status: DC | PRN

## 2014-02-20 MED ORDER — PENICILLIN G POTASSIUM 5000000 UNITS IJ SOLR
2.5000 10*6.[IU] | INTRAVENOUS | Status: DC
  Filled 2014-02-20 (×4): qty 2.5

## 2014-02-20 MED ORDER — LIDOCAINE HCL (PF) 1 % IJ SOLN
30.0000 mL | INTRAMUSCULAR | Status: DC | PRN
  Filled 2014-02-20: qty 30

## 2014-02-20 MED ORDER — ACETAMINOPHEN 325 MG PO TABS: 650.0000 mg | ORAL_TABLET | ORAL | Status: DC | PRN

## 2014-02-20 MED ORDER — OXYCODONE-ACETAMINOPHEN 5-325 MG PO TABS: 1.0000 | ORAL_TABLET | ORAL | Status: DC | PRN

## 2014-02-20 MED ORDER — EPHEDRINE 5 MG/ML INJ
10.0000 mg | INTRAVENOUS | Status: DC | PRN
  Filled 2014-02-20: qty 2

## 2014-02-20 MED ORDER — OXYTOCIN BOLUS FROM INFUSION
500.0000 mL | INTRAVENOUS | Status: DC
  Administered 2014-02-21: 500 mL via INTRAVENOUS

## 2014-02-20 MED ORDER — DEXTROSE 5 % IV SOLN
5.0000 10*6.[IU] | Freq: Once | INTRAVENOUS | Status: AC
  Administered 2014-02-20: 5 10*6.[IU] via INTRAVENOUS
  Filled 2014-02-20: qty 5

## 2014-02-20 MED ORDER — LACTATED RINGERS IV SOLN
500.0000 mL | Freq: Once | INTRAVENOUS | Status: AC
  Administered 2014-02-20: 500 mL via INTRAVENOUS

## 2014-02-20 MED ORDER — PHENYLEPHRINE 40 MCG/ML (10ML) SYRINGE FOR IV PUSH (FOR BLOOD PRESSURE SUPPORT)
80.0000 ug | PREFILLED_SYRINGE | INTRAVENOUS | Status: DC | PRN
  Filled 2014-02-20: qty 2
  Filled 2014-02-20: qty 10

## 2014-02-20 MED ORDER — LACTATED RINGERS IV SOLN: 500.0000 mL | INTRAVENOUS | Status: DC | PRN

## 2014-02-20 MED ORDER — LACTATED RINGERS IV SOLN
INTRAVENOUS | Status: DC
  Administered 2014-02-20 – 2014-02-21 (×2): via INTRAVENOUS

## 2014-02-20 MED ORDER — OXYTOCIN 40 UNITS IN LACTATED RINGERS INFUSION - SIMPLE MED
62.5000 mL/h | INTRAVENOUS | Status: DC
  Administered 2014-02-21: 62.5 mL/h via INTRAVENOUS
  Filled 2014-02-20: qty 1000

## 2014-02-20 NOTE — MAU Note (Signed)
Contractions since 1500 that are regular. Was 4cm last check yesterday. Think my water broke at 2015. Leaking a lot of clear fld.

## 2014-02-20 NOTE — Progress Notes (Signed)
[facility] via wc

## 2014-02-20 NOTE — H&P (Signed)
LABOR ADMISSION HISTORY AND PHYSICAL   Stacey Santiago is a 33 y.o. female G2P1001 with IUP at 4777w3d by LMP presenting for ROM and contractions. Pt states that  Her water broke around 8pm tonight. She ahd been contracting regularly since 1500 but they have just recently gotten much more uncomfortable.  +FM. No bleeding.   [facility] Uncomplicated. GBS positive.    Prenatal History/Complications:  Past Medical History: History reviewed. No pertinent past medical history.  Past Surgical History: Past Surgical History  Procedure Laterality Date  . Wisdom tooth extraction  19 yrs. of age    Obstetrical History: OB History   Grav Para Term Preterm Abortions TAB SAB Ect Mult Living   2 1 1       1     G1- NSVD, 8lb  Social History: History   Social History  . Marital Status: Married    Spouse Name: N/A    Number of Children: N/A  . Years of Education: N/A   Social History Main Topics  . Smoking status: Never Smoker   . Smokeless tobacco: Never Used  . Alcohol Use: Yes     Comment: social  . Drug Use: No  . Sexual Activity: Yes    Partners: Male   Other Topics Concern  . None   Social History Narrative  . None    Family History: Family History  Problem Relation Age of Onset  . Diabetes Mother   . Hypertension Mother   . Hyperlipidemia Mother   . Hypertension Father   . Hyperlipidemia Father   . Diabetes Maternal Grandfather     Allergies: No Known Allergies  Prescriptions prior to admission  Medication Sig Dispense Refill  . calcium carbonate (TUMS - DOSED IN MG ELEMENTAL CALCIUM) 500 MG chewable tablet Chew 2 tablets by mouth daily as needed for indigestion or heartburn.      . Prenatal Vit-Fe Fumarate-FA (MULTIVITAMIN-PRENATAL) 27-0.8 MG TABS Take 1 tablet by mouth daily at 12 noon.         Review of Systems  All systems reviewed and negative except as stated in HPI    Blood pressure 130/79, pulse 78, temperature 98.5 F (36.9  C), temperature source Oral, resp. rate 18, height 5\' 5"  (1.651 m), weight 96.525 kg (212 lb 12.8 oz), last menstrual period 05/13/2013. General appearance: alert, cooperative and no distress Lungs: clear to auscultation bilaterally Heart: regular rate and rhythm Abdomen: soft, non-tender; bowel sounds normal Extremities: Homans sign is negative, no sign of DVT  Presentation: cephalic Fetal monitoringBaseline: 145 bpm, Variability: Good {> 6 bpm), Accelerations: Reactive and no decels Uterine activity every 2-4 min      Prenatal labs: ABO, Rh: A, A/Positive/-- (07/29 0000) Antibody: Negative, Negative (07/29 0000) Rubella:   RPR: Nonreactive (07/29 0000)  HBsAg: Negative, Negative (07/29 0000)  HIV: Non-reactive (07/29 0000)  GBS: Positive (02/16 0000)  1 hr Glucola 133 Genetic screening  Normal NT but AFP not done Anatomy US  normal    Assessment: Stacey MiyamotoMegan Parzych is a 33 y.o. G2P1001 with an IUP at 1577w3d presenting for SROM and contractions.   Plan: 1) admit to L&D - routine orders - epidural prn  2) FWB - cat I tracing - GBS pos start PCN - EFW 8.5-9lbs  3) anticipate SVD   Aria Pickrell L, MD 02/20/2014, 10:58 PM

## 2014-02-20 NOTE — Telephone Encounter (Signed)
Preadmission screen  

## 2014-02-21 ENCOUNTER — Encounter (HOSPITAL_COMMUNITY): Payer: Self-pay | Admitting: *Deleted

## 2014-02-21 ENCOUNTER — Encounter (HOSPITAL_COMMUNITY): Payer: 59 | Admitting: Anesthesiology

## 2014-02-21 ENCOUNTER — Inpatient Hospital Stay (HOSPITAL_COMMUNITY): Payer: 59 | Admitting: Anesthesiology

## 2014-02-21 DIAGNOSIS — O99892 Other specified diseases and conditions complicating childbirth: Secondary | ICD-10-CM

## 2014-02-21 DIAGNOSIS — O9989 Other specified diseases and conditions complicating pregnancy, childbirth and the puerperium: Secondary | ICD-10-CM

## 2014-02-21 DIAGNOSIS — O411090 Infection of amniotic sac and membranes, unspecified, unspecified trimester, not applicable or unspecified: Secondary | ICD-10-CM

## 2014-02-21 LAB — TYPE AND SCREEN
ABO/RH(D): A POS
Antibody Screen: NEGATIVE

## 2014-02-21 LAB — RPR: RPR: NONREACTIVE

## 2014-02-21 LAB — ABO/RH: ABO/RH(D): A POS

## 2014-02-21 MED ORDER — OXYCODONE-ACETAMINOPHEN 5-325 MG PO TABS: 1.0000 | ORAL_TABLET | ORAL | Status: DC | PRN

## 2014-02-21 MED ORDER — ONDANSETRON HCL 4 MG/2ML IJ SOLN: 4.0000 mg | INTRAMUSCULAR | Status: DC | PRN

## 2014-02-21 MED ORDER — GENTAMICIN SULFATE 40 MG/ML IJ SOLN
180.0000 mg | Freq: Three times a day (TID) | INTRAVENOUS | Status: DC
  Administered 2014-02-21: 180 mg via INTRAVENOUS
  Filled 2014-02-21 (×2): qty 4.5

## 2014-02-21 MED ORDER — SENNOSIDES-DOCUSATE SODIUM 8.6-50 MG PO TABS
2.0000 | ORAL_TABLET | ORAL | Status: DC
  Administered 2014-02-21: 2 via ORAL
  Filled 2014-02-21: qty 2

## 2014-02-21 MED ORDER — TETANUS-DIPHTH-ACELL PERTUSSIS 5-2.5-18.5 LF-MCG/0.5 IM SUSP: 0.5000 mL | Freq: Once | INTRAMUSCULAR | Status: DC

## 2014-02-21 MED ORDER — DIPHENHYDRAMINE HCL 25 MG PO CAPS: 25.0000 mg | ORAL_CAPSULE | Freq: Four times a day (QID) | ORAL | Status: DC | PRN

## 2014-02-21 MED ORDER — DIBUCAINE 1 % RE OINT
1.0000 "application " | TOPICAL_OINTMENT | RECTAL | Status: DC | PRN
  Administered 2014-02-22: 1 via RECTAL
  Filled 2014-02-21: qty 28

## 2014-02-21 MED ORDER — WITCH HAZEL-GLYCERIN EX PADS
1.0000 "application " | MEDICATED_PAD | CUTANEOUS | Status: DC | PRN
  Administered 2014-02-22: 1 via TOPICAL

## 2014-02-21 MED ORDER — ZOLPIDEM TARTRATE 5 MG PO TABS: 5.0000 mg | ORAL_TABLET | Freq: Every evening | ORAL | Status: DC | PRN

## 2014-02-21 MED ORDER — MEASLES, MUMPS & RUBELLA VAC ~~LOC~~ INJ: 0.5000 mL | INJECTION | Freq: Once | SUBCUTANEOUS | Status: DC

## 2014-02-21 MED ORDER — SIMETHICONE 80 MG PO CHEW: 80.0000 mg | CHEWABLE_TABLET | ORAL | Status: DC | PRN

## 2014-02-21 MED ORDER — ONDANSETRON HCL 4 MG PO TABS: 4.0000 mg | ORAL_TABLET | ORAL | Status: DC | PRN

## 2014-02-21 MED ORDER — BENZOCAINE-MENTHOL 20-0.5 % EX AERO: 1.0000 "application " | INHALATION_SPRAY | CUTANEOUS | Status: DC | PRN

## 2014-02-21 MED ORDER — SODIUM CHLORIDE 0.9 % IV SOLN
2.0000 g | Freq: Four times a day (QID) | INTRAVENOUS | Status: DC
  Administered 2014-02-21: 2 g via INTRAVENOUS
  Filled 2014-02-21 (×2): qty 2000

## 2014-02-21 MED ORDER — ACETAMINOPHEN 500 MG PO TABS
1000.0000 mg | ORAL_TABLET | Freq: Once | ORAL | Status: AC
  Administered 2014-02-21: 1000 mg via ORAL
  Filled 2014-02-21: qty 2

## 2014-02-21 MED ORDER — PRENATAL MULTIVITAMIN CH
1.0000 | ORAL_TABLET | Freq: Every day | ORAL | Status: DC
  Administered 2014-02-21 – 2014-02-22 (×2): 1 via ORAL
  Filled 2014-02-21 (×2): qty 1

## 2014-02-21 MED ORDER — SODIUM BICARBONATE 8.4 % IV SOLN
INTRAVENOUS | Status: DC | PRN
  Administered 2014-02-21: 5 mL via EPIDURAL

## 2014-02-21 MED ORDER — LANOLIN HYDROUS EX OINT: TOPICAL_OINTMENT | CUTANEOUS | Status: DC | PRN

## 2014-02-21 MED ORDER — IBUPROFEN 600 MG PO TABS
600.0000 mg | ORAL_TABLET | Freq: Four times a day (QID) | ORAL | Status: DC
  Administered 2014-02-21 – 2014-02-22 (×4): 600 mg via ORAL
  Filled 2014-02-21 (×5): qty 1

## 2014-02-21 NOTE — H&P (Signed)
Attestation of Attending Supervision of Advanced Practitioner (PA/CNM/NP): Evaluation and management procedures were performed by the Advanced Practitioner under my supervision and collaboration.  I have reviewed the Advanced Practitioner's note and chart, and I agree with the management and plan.  Reva BoresPRATT,Herberth Deharo S, MD [facility] Attending 02/21/2014 2:17 AM

## 2014-02-21 NOTE — Progress Notes (Signed)
Stacey Santiago is a 33 y.o. G2P1001 at 7750w4d admitted for active labor, rupture of membranes  Subjective:  Pt comfortable now s/p epidural. +FM.   Objective: BP 103/65  Pulse 79  Temp(Src) 99.5 F (37.5 C) (Oral)  Resp 18  Ht 5\' 5"  (1.651 m)  Wt 96.525 kg (212 lb 12.8 oz)  BMI 35.41 kg/m2  LMP 05/13/2013      FHT:  FHR: 165 bpm, variability: moderate,  accelerations:  Present,  decelerations:  Absent UC:   Every 2 min SVE:   Dilation: 9 Effacement (%): 90 Station: -1 Exam by:: Dr. Reola CalkinsBeck  Labs: Lab Results  Component Value Date   WBC 15.1* 02/20/2014   HGB 12.0 02/20/2014   HCT 35.2* 02/20/2014   MCV 90.7 02/20/2014   PLT 238 02/20/2014    Assessment / Plan: Spontaneous labor, progressing normally now febrile.   Labor: Progressing normally Fetal Wellbeing:  Category II Pain Control:  Epidural I/D:  GBS + and now with fetal tachycardia and mom warm to touch. will go ahead and start amp/gent for chorio Anticipated MOD:  NSVD  Eisen Robenson L 02/21/2014, 2:42 AM

## 2014-02-21 NOTE — Anesthesia Preprocedure Evaluation (Signed)
Anesthesia Evaluation Anesthesia Physical Anesthesia Plan  ASA: II  Anesthesia Plan: Epidural   Post-op Pain Management:    Induction:   Airway Management Planned:   Additional Equipment:   Intra-op Plan:   Post-operative Plan:   Informed Consent: I have reviewed the patients History and Physical, chart, labs and discussed the procedure including the risks, benefits and alternatives for the proposed anesthesia with the patient or authorized representative who has indicated his/her understanding and acceptance.   Dental Advisory Given  Plan Discussed with:   Anesthesia Plan Comments: (Labs checked- platelets confirmed with RN in room. Fetal heart tracing, per RN, reported to be stable enough for sitting procedure. Discussed epidural, and patient consents to the procedure:  included risk of possible headache,backache, failed block, allergic reaction, and nerve injury. This patient was asked if she had any questions or concerns before the procedure started.)        Anesthesia Quick Evaluation  

## 2014-02-21 NOTE — Anesthesia Postprocedure Evaluation (Signed)
Anesthesia Post Note  Patient: Stacey Santiago  Procedure(s) Performed: * No procedures listed *  Anesthesia type: Epidural  Patient location: Mother/Baby  Post pain: Pain level controlled  Post assessment: Post-op Vital signs reviewed  Last Vitals:  Filed Vitals:   02/21/14 0914  BP: 113/72  Pulse: 83  Temp: 37.1 C  Resp: 20    Post vital signs: Reviewed  Level of consciousness:alert  Complications: No apparent anesthesia complications

## 2014-02-21 NOTE — Anesthesia Procedure Notes (Signed)

## 2014-02-22 MED ORDER — IBUPROFEN 600 MG PO TABS: 600.0000 mg | ORAL_TABLET | Freq: Four times a day (QID) | ORAL | Status: DC

## 2014-02-22 NOTE — Discharge Instructions (Signed)

## 2014-02-22 NOTE — Discharge Summary (Signed)
Obstetric Discharge Summary Reason for Admission: onset of labor Prenatal Procedures: none Intrapartum Procedures: spontaneous vaginal delivery, GBS prophylaxis and Amp/Gent for chorio Postpartum Procedures: none Complications-Operative and Postpartum: 2nd degree perineal laceration Hemoglobin  Date Value Ref Range Status  02/20/2014 12.0  12.0 - 15.0 g/dL Final     HCT  Date Value Ref Range Status  02/20/2014 35.2* 36.0 - 46.0 % Final    Discharge Diagnoses: Term Pregnancy-delivered and Chorioamnionitis   Hospital Course:  Stacey Santiago is a 33 y.o. [id number] who presented with active labor. She developed chorioamnionitis and was treated with amp/gent during delivery.  She had a uncomplicated SVD with a second degree perineal tear that was repaired. She was able to ambulate, tolerate PO and void normally. She was discharged home with instructions for postpartum care.    Delivery Note  At 4:39 AM a viable female was delivered via Vaginal, Spontaneous Delivery (Presentation: ; Occiput Anterior). APGAR: 8, 9; weight TBD.  Placenta status: Intact, Spontaneous. Cord: 3 vessels with the following complications: None.   Anesthesia: Epidural  Episiotomy: None  Lacerations: 2nd degree  Suture Repair: 3.0 vicryl rapide  Est. Blood Loss (mL): 400cc  Mom to postpartum. Baby to Couplet care / Skin to Skin.  Pt pushed with good maternal effort to deliver a liveborn female via NSVD.Stacey Santiago. Cry with stimulation. Cord clamped and cut. Placenta delivered intact with 3V cord with traction and pit. 2nd degree tear repaired in usual fashion. EBL 400. No complications. Baby to skin to skin and mom to postpartum.    Physical Exam:  General: alert, cooperative and no distress Lochia: appropriate Uterine Fundus: firm DVT Evaluation: No evidence of DVT seen on physical exam. Negative Homan's sign. No cords or calf tenderness. No significant calf/ankle edema.  Discharge Information: Date: 02/22/2014 Activity: pelvic  rest Diet: routine Medications: Ibuprofen Baby feeding: plans to breastfeed Contraception: OCPs Condition: stable Instructions: refer to practice specific booklet Discharge to: home   Newborn Data: Live born female  Birth Weight: 9 lb 14.9 oz (4505 g) APGAR: 8, 9  Home with mother.  Stacey LowJames Joyner, MD [facility] PGY-1 02/22/2014, 7:08 AM  I was present for the exam and agree with above. Circ before D/C.   [place] Stacey Santiago, CNM 02/22/2014 9:53 AM

## 2014-02-22 NOTE — Discharge Summary (Signed)
Attestation of Attending Supervision of Advanced Practitioner (CNM/NP): Evaluation and management procedures were performed by the Advanced Practitioner under my supervision and collaboration.  I have reviewed the Advanced Practitioner's note and chart, and I agree with the management and plan.  HARRAWAY-SMITH, Reveca Desmarais 10:57 AM

## 2014-02-24 ENCOUNTER — Inpatient Hospital Stay (HOSPITAL_COMMUNITY): Admission: RE | Admit: 2014-02-24 | Payer: 59 | Source: Ambulatory Visit

## 2014-04-06 ENCOUNTER — Ambulatory Visit (INDEPENDENT_AMBULATORY_CARE_PROVIDER_SITE_OTHER): Payer: 59 | Admitting: Obstetrics & Gynecology

## 2014-04-06 ENCOUNTER — Encounter: Payer: Self-pay | Admitting: Obstetrics & Gynecology

## 2014-04-06 MED ORDER — NORETHINDRONE 0.35 MG PO TABS: 1.0000 | ORAL_TABLET | Freq: Every day | ORAL | Status: DC

## 2014-04-06 NOTE — Addendum Note (Signed)
Addended by: Allie BossierVE, Keyshaun Exley C on: 04/06/2014 03:25 PM   Modules accepted: Orders

## 2014-04-06 NOTE — Progress Notes (Signed)
   Subjective:    Patient ID: Stacey MiyamotoMegan Santiago, female    DOB: 1981/06/08, 33 y.o.   MRN: [id number]  HPI 33 yo MW P2 (33 yo daughter and nb son). Baby growing well with breastfeeding. She wants OCPs. No sex yet. Denies depression symptoms. Some constipation, using stool softeners, some hemorrhoids. I recommend Miralax. Normal bladder function.   Review of Systems All normal paps. [facility] and normal last year.    Objective:   Physical Exam  Healed perineum Hemorrhoids noted      Assessment & Plan:  Preventative care- RTC for annual 6 months Contraception- Mircronor. She will change to combination OCPs when she quits breastfeeding.

## 2014-05-15 ENCOUNTER — Encounter: Payer: Self-pay | Admitting: *Deleted

## 2014-06-17 ENCOUNTER — Telehealth: Payer: Self-pay | Admitting: *Deleted

## 2014-06-17 DIAGNOSIS — N61 Mastitis without abscess: Secondary | ICD-10-CM

## 2014-06-17 MED ORDER — DICLOXACILLIN SODIUM 250 MG PO CAPS
250.0000 mg | ORAL_CAPSULE | Freq: Four times a day (QID) | ORAL | Status: DC
Start: 2014-06-17 — End: 2015-06-29

## 2014-06-17 NOTE — Telephone Encounter (Signed)
Patient is having increased right breast pain, chills, and body aches.  We do not have a physician available this afternoon so I will call in medication for patient and she will follow up with us if her symptoms are not alleviated within a couple of days to be seen.

## 2014-10-12 ENCOUNTER — Encounter: Payer: Self-pay | Admitting: Obstetrics & Gynecology

## 2015-04-26 DIAGNOSIS — Z3009 Encounter for other general counseling and advice on contraception: Secondary | ICD-10-CM | POA: Insufficient documentation

## 2015-04-26 DIAGNOSIS — F4320 Adjustment disorder, unspecified: Secondary | ICD-10-CM | POA: Insufficient documentation

## 2015-05-24 ENCOUNTER — Other Ambulatory Visit: Payer: Self-pay | Admitting: Family Medicine

## 2015-05-24 DIAGNOSIS — F4322 Adjustment disorder with anxiety: Secondary | ICD-10-CM

## 2015-05-24 DIAGNOSIS — F4320 Adjustment disorder, unspecified: Secondary | ICD-10-CM | POA: Insufficient documentation

## 2015-06-22 ENCOUNTER — Ambulatory Visit: Payer: Self-pay | Admitting: Family Medicine

## 2015-06-25 ENCOUNTER — Other Ambulatory Visit: Payer: Self-pay | Admitting: Family Medicine

## 2015-06-25 DIAGNOSIS — F4322 Adjustment disorder with anxiety: Secondary | ICD-10-CM

## 2015-06-25 MED ORDER — ESCITALOPRAM OXALATE 10 MG PO TABS: 10.0000 mg | ORAL_TABLET | Freq: Every day | ORAL | Status: DC

## 2015-06-25 NOTE — Telephone Encounter (Signed)
escitalopram (LEXAPRO) 10 MG tablet Pt called needing refill.  She says she has to get it from [facility] now instead of the local pharmacy.  Please send RX to [facility].  Thanks, TP

## 2015-06-29 ENCOUNTER — Ambulatory Visit (INDEPENDENT_AMBULATORY_CARE_PROVIDER_SITE_OTHER): Payer: 59 | Admitting: Family Medicine

## 2015-06-29 ENCOUNTER — Encounter: Payer: Self-pay | Admitting: Family Medicine

## 2015-06-29 VITALS — BP 102/62 | HR 76 | Temp 98.1°F | Resp 16 | Ht 65.0 in | Wt 179.0 lb

## 2015-06-29 DIAGNOSIS — F4322 Adjustment disorder with anxiety: Secondary | ICD-10-CM

## 2015-06-29 NOTE — Progress Notes (Signed)
Patient ID: Stacey MiyamotoMegan Santiago, female   DOB: Jun 20, 1981, 34 y.o.   MRN: [id number]       Patient: Stacey MiyamotoMegan Santiago Female    DOB: Jun 20, 1981   34 y.o.   MRN: [id number] Visit Date: 06/29/2015  Today's Provider: Lorie PhenixNancy Menna Abeln, MD   Chief Complaint  Patient presents with  . Depression   Subjective:    HPI    Depression, Follow-up  She  was last seen for this 3 months ago. Changes made at last visit include started Escitalopram Oxalate 10mg .   She reports excellent compliance with treatment. She is not having side effects currently. Is taking it in the am. Less short fused.   She reports excellent tolerance of treatment. Current symptoms include: none She feels she is Improved since last visit.    Patient Active Problem List   Diagnosis Date Noted  . Adjustment reaction 05/24/2015  . Adaptation reaction 04/26/2015  . Family planning 04/26/2015  . Pregnancy 02/20/2014  . Supervision of normal pregnancy 07/08/2013  . Chromophytosis 01/31/2007  . Acid reflux 01/30/2007        No Known Allergies Previous Medications   DESOGESTREL-ETHINYL ESTRADIOL (APRI,EMOQUETTE,SOLIA) 0.15-30 MG-MCG TABLET    Take 1 tablet by mouth daily.    ESCITALOPRAM (LEXAPRO) 10 MG TABLET    Take 1 tablet (10 mg total) by mouth daily.    Review of Systems  History  Substance Use Topics  . Smoking status: Never Smoker   . Smokeless tobacco: Never Used  . Alcohol Use: Yes     Comment: social   Objective:   BP 102/62 mmHg  Pulse 76  Temp(Src) 98.1 F (36.7 C) (Oral)  Resp 16  Ht 5\' 5"  (1.651 m)  Wt 179 lb (81.194 kg)  BMI 29.79 kg/m2  LMP 06/23/2015 (Exact Date)  Physical Exam  Constitutional: She is oriented to person, place, and time. She appears well-developed and well-nourished.  Neurological: She is alert and oriented to person, place, and time.  Psychiatric: She has a normal mood and affect. Her behavior is normal. Judgment and thought content normal.        Assessment & Plan:       1. Adjustment disorder with anxious mood  Improved. Continue current medication. Recheck at CPE. Please call back if condition worsens or does not continue to improve.         Lorie PhenixNancy Rishawn Walck, MD  [facility]

## 2015-06-29 NOTE — Progress Notes (Deleted)
Patient ID: Stacey MiyamotoMegan Torry, female   DOB: 1981/11/10, 34 y.o.   MRN: [id number]  Review of Systems

## 2015-07-02 ENCOUNTER — Encounter: Payer: Self-pay | Admitting: Family Medicine

## 2016-03-20 ENCOUNTER — Encounter: Payer: 59 | Admitting: Family Medicine

## 2016-04-23 ENCOUNTER — Other Ambulatory Visit: Payer: Self-pay | Admitting: Family Medicine

## 2016-04-27 ENCOUNTER — Encounter: Payer: Self-pay | Admitting: Family Medicine

## 2016-04-27 ENCOUNTER — Ambulatory Visit (INDEPENDENT_AMBULATORY_CARE_PROVIDER_SITE_OTHER): Payer: 59 | Admitting: Family Medicine

## 2016-04-27 VITALS — BP 96/60 | HR 76 | Temp 98.3°F | Resp 16 | Ht 65.25 in | Wt 193.0 lb

## 2016-04-27 DIAGNOSIS — Z3009 Encounter for other general counseling and advice on contraception: Secondary | ICD-10-CM

## 2016-04-27 DIAGNOSIS — F4322 Adjustment disorder with anxiety: Secondary | ICD-10-CM | POA: Diagnosis not present

## 2016-04-27 DIAGNOSIS — Z0000 Encounter for general adult medical examination without abnormal findings: Secondary | ICD-10-CM | POA: Diagnosis not present

## 2016-04-27 MED ORDER — ESCITALOPRAM OXALATE 10 MG PO TABS: 10.0000 mg | ORAL_TABLET | Freq: Every day | ORAL | Status: DC

## 2016-04-27 MED ORDER — DESOGESTREL-ETHINYL ESTRADIOL 0.15-30 MG-MCG PO TABS: 1.0000 | ORAL_TABLET | Freq: Every day | ORAL | Status: DC

## 2016-04-27 NOTE — Progress Notes (Signed)
Patient ID: Stacey Santiago, female   DOB: [birth year], 35 y.o.   MRN: [id number]       Patient: Stacey Santiago, Female    DOB: 1981-01-14, 35 y.o.   MRN: [id number] Visit Date: 04/27/2016  Today's Provider: Lorie Phenix, MD   Chief Complaint  Patient presents with  . Annual Exam   Subjective:    Annual physical exam [name] is a 35 y.o. female who presents today for health maintenance and complete physical. She feels well. She reports exercising 3 days a week. She reports she is sleeping well.  03/17/15 CPE 06/06/13 Pap-neg; per pt had pap at GYN in 04/2014 03/27/12 HPV-neg  -----------------------------------------------------------------   Review of Systems  Constitutional: Negative.   HENT: Negative.   Eyes: Negative.   Respiratory: Negative.   Cardiovascular: Negative.   Gastrointestinal: Negative.   Endocrine: Negative.   Genitourinary: Negative.   Musculoskeletal: Negative.   Skin: Negative.   Allergic/Immunologic: Negative.   Neurological: Negative.   Hematological: Negative.   Psychiatric/Behavioral: Negative.     Social History      She  reports that she has never smoked. She has never used smokeless tobacco. She reports that she drinks alcohol. She reports that she does not use illicit drugs.       Social History   Social History  . Marital Status: Married    Spouse Name: Ramon Dredge  . Number of Children: 2  . Years of Education: N/A   Social History Main Topics  . Smoking status: Never Smoker   . Smokeless tobacco: Never Used  . Alcohol Use: Yes     Comment: social  . Drug Use: No  . Sexual Activity:    Partners: Male   Other Topics Concern  . None   Social History Narrative    Past Medical History  Diagnosis Date  . Anxiety   . GERD (gastroesophageal reflux disease)   . Depression      Patient Active Problem List   Diagnosis Date Noted  . Adjustment reaction 05/24/2015  . Adaptation reaction 04/26/2015  . Family planning 04/26/2015  .  Pregnancy 02/20/2014  . Supervision of normal pregnancy 07/08/2013  . Chromophytosis 01/31/2007  . Acid reflux 01/30/2007    Past Surgical History  Procedure Laterality Date  . Wisdom tooth extraction  19 yrs. of age    Family History        Family Status  Relation Status Death Age  . Mother Alive   . Father Alive   . Brother Alive         Her family history includes Diabetes in her maternal grandfather and mother; Hyperlipidemia in her father and mother; Hypertension in her father and mother.    No Known Allergies  Previous Medications   APRI 0.15-30 MG-MCG TABLET    TAKE 1 TABLET BY MOUTH DAILY   ESCITALOPRAM (LEXAPRO) 10 MG TABLET    Take 1 tablet (10 mg total) by mouth daily.    Patient Care Team: Lorie Phenix, MD as PCP - General (Family Medicine)     Objective:   Vitals: BP 96/60 mmHg  Pulse 76  Temp(Src) 98.3 F (36.8 C) (Oral)  Resp 16  Ht 5' 5.25" (1.657 m)  Wt 193 lb (87.544 kg)  BMI 31.88 kg/m2  SpO2 100%  LMP 04/27/2016  Breastfeeding? No   Physical Exam  Constitutional: She is oriented to person, place, and time. She appears well-developed and well-nourished.  HENT:  Head: Normocephalic and atraumatic.  Right Ear:  Tympanic membrane, external ear and ear canal normal.  Left Ear: Tympanic membrane, external ear and ear canal normal.  Nose: Nose normal.  Mouth/Throat: Uvula is midline, oropharynx is clear and moist and mucous membranes are normal.  Eyes: Conjunctivae, EOM and lids are normal. Pupils are equal, round, and reactive to light.  Neck: Trachea normal and normal range of motion. Neck supple. Carotid bruit is not present. No thyroid mass and no thyromegaly present.  Cardiovascular: Normal rate, regular rhythm and normal heart sounds.   Pulmonary/Chest: Effort normal and breath sounds normal.  Abdominal: Normal appearance. There is no hepatosplenomegaly. There is no tenderness.  Genitourinary: No breast swelling, tenderness or discharge.    Musculoskeletal: Normal range of motion.  Lymphadenopathy:    She has no cervical adenopathy.    She has no axillary adenopathy.  Neurological: She is alert and oriented to person, place, and time. She has normal strength. No cranial nerve deficit.  Skin: Skin is warm, dry and intact.  Psychiatric: She has a normal mood and affect. Her speech is normal and behavior is normal. Judgment and thought content normal. Cognition and memory are normal.     Depression Screen PHQ 2/9 Scores 04/27/2016  PHQ - 2 Score 0      Assessment & Plan:     Routine Health Maintenance and Physical Exam  Exercise Activities and Dietary recommendations Goals    None      Immunization History  Administered Date(s) Administered  . DTaP 12/11/1998  . Influenza Whole 10/11/2012  . Influenza,inj,Quad PF,36+ Mos 10/01/2013  . Td 03/27/2012  . Tdap 12/25/2013    1. Annual physical exam Eat healthy and exercise.    2. Family planning Continue medication.    - desogestrel-ethinyl estradiol (APRI) 0.15-30 MG-MCG tablet; Take 1 tablet by mouth daily.  Dispense: 28 tablet; Refill: 12  3. Adjustment disorder with anxious mood Condition is stable. Please continue current medication and  plan of care as noted.   - escitalopram (LEXAPRO) 10 MG tablet; Take 1 tablet (10 mg total) by mouth daily.  Dispense: 90 tablet; Refill: 3   Patient was seen and examined by Leo GrosserNancy J. Tarris Delbene, MD, and note scribed by Rondel BatonSulibeya Dimas, CMA.     I have reviewed the document for accuracy and completeness and I agree with above. Leo Grosser- Cortney Beissel J. Aerielle Stoklosa, MD   Lorie PhenixNancy Mariadelaluz [place], MD   --------------------------------------------------------------------

## 2016-06-09 ENCOUNTER — Other Ambulatory Visit: Payer: Self-pay | Admitting: Unknown Physician Specialty

## 2016-06-09 ENCOUNTER — Encounter
Admission: EM | Disposition: A | Discharge status: Home / Self Care | Payer: Self-pay | Source: Home / Self Care | Attending: Emergency Medicine

## 2016-06-09 ENCOUNTER — Observation Stay
Admission: EM | Admit: 2016-06-09 | Discharge: 2016-06-10 | Disposition: A | Discharge status: Home / Self Care | Payer: 59 | Attending: General Surgery | Admitting: General Surgery

## 2016-06-09 ENCOUNTER — Encounter: Payer: Self-pay | Admitting: Emergency Medicine

## 2016-06-09 ENCOUNTER — Emergency Department: Payer: 59 | Admitting: Anesthesiology

## 2016-06-09 ENCOUNTER — Ambulatory Visit
Admission: RE | Admit: 2016-06-09 | Discharge: 2016-06-09 | Disposition: A | Discharge status: Home / Self Care | Payer: 59 | Source: Ambulatory Visit | Attending: Unknown Physician Specialty | Admitting: Unknown Physician Specialty

## 2016-06-09 ENCOUNTER — Ambulatory Visit: Admit: 2016-06-09 | Payer: Self-pay | Admitting: General Surgery

## 2016-06-09 DIAGNOSIS — R109 Unspecified abdominal pain: Secondary | ICD-10-CM

## 2016-06-09 DIAGNOSIS — F4320 Adjustment disorder, unspecified: Secondary | ICD-10-CM | POA: Diagnosis not present

## 2016-06-09 DIAGNOSIS — K3580 Unspecified acute appendicitis: Principal | ICD-10-CM

## 2016-06-09 DIAGNOSIS — Z79899 Other long term (current) drug therapy: Secondary | ICD-10-CM | POA: Insufficient documentation

## 2016-06-09 DIAGNOSIS — K37 Unspecified appendicitis: Secondary | ICD-10-CM | POA: Diagnosis present

## 2016-06-09 DIAGNOSIS — F329 Major depressive disorder, single episode, unspecified: Secondary | ICD-10-CM | POA: Insufficient documentation

## 2016-06-09 DIAGNOSIS — Z833 Family history of diabetes mellitus: Secondary | ICD-10-CM | POA: Insufficient documentation

## 2016-06-09 DIAGNOSIS — Z8249 Family history of ischemic heart disease and other diseases of the circulatory system: Secondary | ICD-10-CM | POA: Diagnosis not present

## 2016-06-09 DIAGNOSIS — K219 Gastro-esophageal reflux disease without esophagitis: Secondary | ICD-10-CM | POA: Insufficient documentation

## 2016-06-09 DIAGNOSIS — K381 Appendicular concretions: Secondary | ICD-10-CM | POA: Insufficient documentation

## 2016-06-09 DIAGNOSIS — F419 Anxiety disorder, unspecified: Secondary | ICD-10-CM | POA: Insufficient documentation

## 2016-06-09 HISTORY — PX: LAPAROSCOPIC APPENDECTOMY: SHX408

## 2016-06-09 LAB — CBC WITH DIFFERENTIAL/PLATELET
BASOS ABS: 0 10*3/uL (ref 0–0.1)
BASOS PCT: 0 %
Eosinophils Absolute: 0.1 10*3/uL (ref 0–0.7)
Eosinophils Relative: 0 %
HCT: 38.2 % (ref 35.0–47.0)
HEMOGLOBIN: 12.7 g/dL (ref 12.0–16.0)
Lymphocytes Relative: 11 %
Lymphs Abs: 1.7 10*3/uL (ref 1.0–3.6)
MCH: 30.4 pg (ref 26.0–34.0)
MCHC: 33.2 g/dL (ref 32.0–36.0)
MCV: 91.5 fL (ref 80.0–100.0)
Monocytes Absolute: 0.8 10*3/uL (ref 0.2–0.9)
Monocytes Relative: 5 %
NEUTROS PCT: 84 %
Neutro Abs: 13.1 10*3/uL — ABNORMAL HIGH (ref 1.4–6.5)
Platelets: 268 10*3/uL (ref 150–440)
RBC: 4.17 MIL/uL (ref 3.80–5.20)
RDW: 13.6 % (ref 11.5–14.5)
WBC: 15.6 10*3/uL — ABNORMAL HIGH (ref 3.6–11.0)

## 2016-06-09 LAB — COMPREHENSIVE METABOLIC PANEL
ALK PHOS: 52 U/L (ref 38–126)
ALT: 18 U/L (ref 14–54)
ANION GAP: 9 (ref 5–15)
AST: 19 U/L (ref 15–41)
Albumin: 4 g/dL (ref 3.5–5.0)
BILIRUBIN TOTAL: 0.5 mg/dL (ref 0.3–1.2)
BUN: 8 mg/dL (ref 6–20)
CALCIUM: 9.3 mg/dL (ref 8.9–10.3)
CO2: 22 mmol/L (ref 22–32)
Chloride: 105 mmol/L (ref 101–111)
Creatinine, Ser: 0.84 mg/dL (ref 0.44–1.00)
Glucose, Bld: 87 mg/dL (ref 65–99)
Potassium: 3.6 mmol/L (ref 3.5–5.1)
Sodium: 136 mmol/L (ref 135–145)
TOTAL PROTEIN: 7.5 g/dL (ref 6.5–8.1)

## 2016-06-09 LAB — POCT PREGNANCY, URINE: Preg Test, Ur: NEGATIVE

## 2016-06-09 SURGERY — APPENDECTOMY, LAPAROSCOPIC
Anesthesia: General | Site: Abdomen | Wound class: Clean Contaminated

## 2016-06-09 MED ORDER — PIPERACILLIN-TAZOBACTAM 3.375 G IVPB 30 MIN
3.3750 g | Freq: Once | INTRAVENOUS | Status: AC
  Administered 2016-06-09: 3.375 g via INTRAVENOUS
  Filled 2016-06-09: qty 50

## 2016-06-09 MED ORDER — KETOROLAC TROMETHAMINE 30 MG/ML IJ SOLN
INTRAMUSCULAR | Status: DC | PRN
  Administered 2016-06-09: 30 mg via INTRAVENOUS

## 2016-06-09 MED ORDER — FENTANYL CITRATE (PF) 100 MCG/2ML IJ SOLN
INTRAMUSCULAR | Status: DC | PRN
  Administered 2016-06-09: 100 ug via INTRAVENOUS

## 2016-06-09 MED ORDER — ONDANSETRON HCL 4 MG/2ML IJ SOLN
INTRAMUSCULAR | Status: DC | PRN
  Administered 2016-06-09: 4 mg via INTRAVENOUS

## 2016-06-09 MED ORDER — MIDAZOLAM HCL 2 MG/2ML IJ SOLN
INTRAMUSCULAR | Status: DC | PRN
  Administered 2016-06-09: 2 mg via INTRAVENOUS

## 2016-06-09 MED ORDER — LACTATED RINGERS IV SOLN
INTRAVENOUS | Status: DC | PRN
  Administered 2016-06-09: 18:00:00 via INTRAVENOUS

## 2016-06-09 MED ORDER — LACTATED RINGERS IV SOLN
INTRAVENOUS | Status: DC
  Administered 2016-06-10: 04:00:00 via INTRAVENOUS

## 2016-06-09 MED ORDER — BUPIVACAINE-EPINEPHRINE (PF) 0.25% -1:200000 IJ SOLN
INTRAMUSCULAR | Status: DC | PRN
  Administered 2016-06-09: 10 mL via PERINEURAL

## 2016-06-09 MED ORDER — ROCURONIUM BROMIDE 100 MG/10ML IV SOLN
INTRAVENOUS | Status: DC | PRN
  Administered 2016-06-09: 40 mg via INTRAVENOUS

## 2016-06-09 MED ORDER — PIPERACILLIN-TAZOBACTAM 3.375 G IVPB
3.3750 g | Freq: Three times a day (TID) | INTRAVENOUS | Status: AC
  Administered 2016-06-09 – 2016-06-10 (×2): 3.375 g via INTRAVENOUS
  Filled 2016-06-09 (×2): qty 50

## 2016-06-09 MED ORDER — ONDANSETRON 8 MG PO TBDP: 4.0000 mg | ORAL_TABLET | Freq: Four times a day (QID) | ORAL | Status: DC | PRN

## 2016-06-09 MED ORDER — IOPAMIDOL (ISOVUE-300) INJECTION 61%
100.0000 mL | Freq: Once | INTRAVENOUS | Status: AC | PRN
  Administered 2016-06-09: 100 mL via INTRAVENOUS

## 2016-06-09 MED ORDER — PROPOFOL 10 MG/ML IV BOLUS
INTRAVENOUS | Status: DC | PRN
  Administered 2016-06-09: 150 mg via INTRAVENOUS

## 2016-06-09 MED ORDER — HYDRALAZINE HCL 20 MG/ML IJ SOLN: 10.0000 mg | INTRAMUSCULAR | Status: DC | PRN

## 2016-06-09 MED ORDER — DEXAMETHASONE SODIUM PHOSPHATE 10 MG/ML IJ SOLN
INTRAMUSCULAR | Status: DC | PRN
  Administered 2016-06-09: 10 mg via INTRAVENOUS

## 2016-06-09 MED ORDER — ESCITALOPRAM OXALATE 10 MG PO TABS
10.0000 mg | ORAL_TABLET | Freq: Every day | ORAL | Status: DC
  Filled 2016-06-09: qty 1

## 2016-06-09 MED ORDER — LIDOCAINE HCL (CARDIAC) 20 MG/ML IV SOLN
INTRAVENOUS | Status: DC | PRN
  Administered 2016-06-09: 100 mg via INTRAVENOUS

## 2016-06-09 MED ORDER — FENTANYL CITRATE (PF) 100 MCG/2ML IJ SOLN: 25.0000 ug | INTRAMUSCULAR | Status: DC | PRN

## 2016-06-09 MED ORDER — OXYCODONE HCL 5 MG/5ML PO SOLN: 5.0000 mg | Freq: Once | ORAL | Status: DC | PRN

## 2016-06-09 MED ORDER — OXYCODONE HCL 5 MG PO TABS: 5.0000 mg | ORAL_TABLET | Freq: Once | ORAL | Status: DC | PRN

## 2016-06-09 MED ORDER — DIPHENHYDRAMINE HCL 50 MG/ML IJ SOLN: 25.0000 mg | Freq: Four times a day (QID) | INTRAMUSCULAR | Status: DC | PRN

## 2016-06-09 MED ORDER — PHENYLEPHRINE HCL 10 MG/ML IJ SOLN
INTRAMUSCULAR | Status: DC | PRN
Start: 2016-06-09 — End: 2016-06-09
  Administered 2016-06-09 (×3): 100 ug via INTRAVENOUS

## 2016-06-09 MED ORDER — DIPHENHYDRAMINE HCL 25 MG PO CAPS: 25.0000 mg | ORAL_CAPSULE | Freq: Four times a day (QID) | ORAL | Status: DC | PRN

## 2016-06-09 MED ORDER — PIPERACILLIN-TAZOBACTAM 3.375 G IVPB 30 MIN: 3.3750 g | Freq: Four times a day (QID) | INTRAVENOUS | Status: DC

## 2016-06-09 MED ORDER — ONDANSETRON HCL 4 MG/2ML IJ SOLN: 4.0000 mg | Freq: Four times a day (QID) | INTRAMUSCULAR | Status: DC | PRN

## 2016-06-09 MED ORDER — HYDROCODONE-ACETAMINOPHEN 5-325 MG PO TABS: 1.0000 | ORAL_TABLET | ORAL | Status: DC | PRN

## 2016-06-09 MED ORDER — SUGAMMADEX SODIUM 200 MG/2ML IV SOLN
INTRAVENOUS | Status: DC | PRN
  Administered 2016-06-09: 200 mg via INTRAVENOUS

## 2016-06-09 MED ORDER — MORPHINE SULFATE (PF) 4 MG/ML IV SOLN: 4.0000 mg | INTRAVENOUS | Status: DC | PRN

## 2016-06-09 MED ORDER — LIDOCAINE HCL 1 % IJ SOLN
INTRAMUSCULAR | Status: DC | PRN
  Administered 2016-06-09: 10 mL

## 2016-06-09 SURGICAL SUPPLY — 41 items
ADHESIVE MASTISOL STRL (MISCELLANEOUS) ×3 IMPLANT
APPLIER CLIP 5 13 M/L LIGAMAX5 (MISCELLANEOUS)
BLADE SURG SZ11 CARB STEEL (BLADE) ×3 IMPLANT
BULB RESERV EVAC DRAIN JP 100C (MISCELLANEOUS) IMPLANT
CANISTER SUCT 1200ML W/VALVE (MISCELLANEOUS) ×3 IMPLANT
CATH TRAY 16F METER LATEX (MISCELLANEOUS) ×3 IMPLANT
CHLORAPREP W/TINT 26ML (MISCELLANEOUS) ×3 IMPLANT
CLIP APPLIE 5 13 M/L LIGAMAX5 (MISCELLANEOUS) IMPLANT
CLOSURE WOUND 1/2 X4 (GAUZE/BANDAGES/DRESSINGS) ×1
CUTTER FLEX LINEAR 45M (STAPLE) ×3 IMPLANT
DRAIN CHANNEL JP 19F (MISCELLANEOUS) IMPLANT
DRESSING TELFA 4X3 1S ST N-ADH (GAUZE/BANDAGES/DRESSINGS) ×3 IMPLANT
DRSG TEGADERM 2-3/8X2-3/4 SM (GAUZE/BANDAGES/DRESSINGS) ×9 IMPLANT
ELECT REM PT RETURN 9FT ADLT (ELECTROSURGICAL) ×3
ELECTRODE REM PT RTRN 9FT ADLT (ELECTROSURGICAL) ×1 IMPLANT
ENDOPOUCH RETRIEVER 10 (MISCELLANEOUS) IMPLANT
GLOVE BIO SURGEON STRL SZ7.5 (GLOVE) ×3 IMPLANT
GLOVE INDICATOR 8.0 STRL GRN (GLOVE) ×3 IMPLANT
GOWN STRL REUS W/ TWL LRG LVL3 (GOWN DISPOSABLE) ×2 IMPLANT
GOWN STRL REUS W/TWL LRG LVL3 (GOWN DISPOSABLE) ×4
IRRIGATION STRYKERFLOW (MISCELLANEOUS) IMPLANT
IRRIGATOR STRYKERFLOW (MISCELLANEOUS)
IV NS 1000ML (IV SOLUTION) ×2
IV NS 1000ML BAXH (IV SOLUTION) ×1 IMPLANT
KIT RM TURNOVER STRD PROC AR (KITS) ×3 IMPLANT
LABEL OR SOLS (LABEL) IMPLANT
LIGASURE BLUNT 5MM 37CM (INSTRUMENTS) ×3 IMPLANT
NEEDLE HYPO 25X1 1.5 SAFETY (NEEDLE) ×3 IMPLANT
NEEDLE VERESS 14GA 120MM (NEEDLE) ×3 IMPLANT
NS IRRIG 500ML POUR BTL (IV SOLUTION) ×3 IMPLANT
PACK LAP CHOLECYSTECTOMY (MISCELLANEOUS) ×3 IMPLANT
RELOAD 45 VASCULAR/THIN (ENDOMECHANICALS) ×3 IMPLANT
RELOAD STAPLE TA45 3.5 REG BLU (ENDOMECHANICALS) ×3 IMPLANT
SLEEVE ENDOPATH XCEL 5M (ENDOMECHANICALS) ×3 IMPLANT
STRIP CLOSURE SKIN 1/2X4 (GAUZE/BANDAGES/DRESSINGS) ×2 IMPLANT
SUT MNCRL 4-0 (SUTURE) ×2
SUT MNCRL 4-0 27XMFL (SUTURE) ×1
SUTURE MNCRL 4-0 27XMF (SUTURE) ×1 IMPLANT
TROCAR XCEL 12X100 BLDLESS (ENDOMECHANICALS) ×3 IMPLANT
TROCAR XCEL NON-BLD 5MMX100MML (ENDOMECHANICALS) ×3 IMPLANT
TUBING INSUFFLATOR HI FLOW (MISCELLANEOUS) ×3 IMPLANT

## 2016-06-09 NOTE — ED Notes (Signed)
Patient being admitted for surgery for appendicitis

## 2016-06-09 NOTE — ED Provider Notes (Signed)
[facility] Emergency Department Provider Note  ____________________________________________   I have reviewed the triage vital signs and the nursing notes.   HISTORY  Chief Complaint Abdominal Pain    HPI Stacey Santiago is a 35 y.o. female presents today with appendicitis. She was seen at an outside clinic CT scan was performed which showed an acute superior of appendicitis with no rupture. See results in computer. She had a CMP which showed results of 17,000 white count no BMP was resulted that I can see. Patient denies productive last by mouth 7:30 this morning. She has had right lower quadrant abdominal pain for the last 2 days with occasional loose stool no fever and no vomiting. Pain is crampy. She does not wish to have anything for pain this time.      Past Medical History  Diagnosis Date  . Anxiety   . GERD (gastroesophageal reflux disease)   . Depression     Patient Active Problem List   Diagnosis Date Noted  . Adjustment reaction 05/24/2015  . Adaptation reaction 04/26/2015  . Family planning 04/26/2015  . Pregnancy 02/20/2014  . Supervision of normal pregnancy 07/08/2013  . Chromophytosis 01/31/2007  . Acid reflux 01/30/2007    Past Surgical History  Procedure Laterality Date  . Wisdom tooth extraction  19 yrs. of age    Current Outpatient Rx  Name  Route  Sig  Dispense  Refill  . desogestrel-ethinyl estradiol (APRI) 0.15-30 MG-MCG tablet   Oral   Take 1 tablet by mouth daily.   28 tablet   12   . escitalopram (LEXAPRO) 10 MG tablet   Oral   Take 1 tablet (10 mg total) by mouth daily.   90 tablet   3     Allergies Review of patient's allergies indicates no known allergies.  Family History  Problem Relation Age of Onset  . Diabetes Mother   . Hypertension Mother   . Hyperlipidemia Mother   . Hypertension Father   . Hyperlipidemia Father   . Diabetes Maternal Grandfather     Social History Social History   Substance Use Topics  . Smoking status: Never Smoker   . Smokeless tobacco: Never Used  . Alcohol Use: Yes     Comment: social    Review of Systems Constitutional: No fever/chills Eyes: No visual changes. ENT: No sore throat. No stiff neck no neck pain Cardiovascular: Denies chest pain. Respiratory: Denies shortness of breath. Gastrointestinal:   no vomiting.  No constipation. Genitourinary: Negative for dysuria. Musculoskeletal: Negative lower extremity swelling Skin: Negative for rash. Neurological: Negative for headaches, focal weakness or numbness. 10-point ROS otherwise negative.  ____________________________________________   PHYSICAL EXAM:  VITAL SIGNS: ED Triage Vitals  Enc Vitals Group     BP 06/09/16 1649 119/73 mmHg     Pulse Rate 06/09/16 1649 73     Resp 06/09/16 1649 18     Temp 06/09/16 1649 98.7 F (37.1 C)     Temp Source 06/09/16 1649 Oral     SpO2 06/09/16 1649 100 %     Weight 06/09/16 1649 193 lb (87.544 kg)     Height 06/09/16 1649 5\' 5"  (1.651 m)     Head Cir --      Peak Flow --      Pain Score 06/09/16 1650 7     Pain Loc --      Pain Edu? --      Excl. in GC? --     Constitutional:  Alert and oriented. Well appearing and in no acute distress. Eyes: Conjunctivae are normal. PERRL. EOMI. Head: Atraumatic. Nose: No congestion/rhinnorhea. Mouth/Throat: Mucous membranes are moist.  Oropharynx non-erythematous. Neck: No stridor.   Nontender with no meningismus Cardiovascular: Normal rate, regular rhythm. Grossly normal heart sounds.  Good peripheral circulation. Respiratory: Normal respiratory effort.  No retractions. Lungs CTAB. Abdominal: Soft and Tender to palpation in the right lower quadrant, no guarding no rebound, no surgical signs. No distention.  Back:  There is no focal tenderness or step off there is no midline tenderness there are no lesions noted. there is no CVA tenderness Musculoskeletal: No lower extremity tenderness. No  joint effusions, no DVT signs strong distal pulses no edema Neurologic:  Normal speech and language. No gross focal neurologic deficits are appreciated.  Skin:  Skin is warm, dry and intact. No rash noted. Psychiatric: Mood and affect are normal. Speech and behavior are normal.  ____________________________________________   LABS (all labs ordered are listed, but only abnormal results are displayed)  Labs Reviewed  URINALYSIS COMPLETEWITH MICROSCOPIC ([facility] ONLY)  PROTIME-INR  COMPREHENSIVE METABOLIC PANEL  CBC WITH DIFFERENTIAL/PLATELET  POC URINE PREG, ED   ____________________________________________  EKG  I personally interpreted any EKGs ordered by me or triage  ____________________________________________  RADIOLOGY  I reviewed any imaging ordered by me or triage that were performed during my shift and, if possible, patient and/or family made aware of any abnormal findings. ____________________________________________   PROCEDURES  Procedure(s) performed: None  Critical Care performed: None  ____________________________________________   INITIAL IMPRESSION / ASSESSMENT AND PLAN / ED COURSE  Pertinent labs & imaging results that were available during my care of the patient were reviewed by me and considered in my medical decision making (see chart for details).  Patient presents with signs of appendicitis with right lower quadrant pain, elevated white count, and a CT verified appendicitis. Abdomen is nonsurgical and there is no evidence of rupture on CT. Discussed with Dr. Tonita CongWoodham of surgery. Given the patient Zosyn at his request,  we will send a CMP to supplement the blood work are given and we will recheck a CBC and reassess. Patient will be admitted to surgery.  ____________________________________________   FINAL CLINICAL IMPRESSION(S) / ED DIAGNOSES  Final diagnoses:  None      This chart was dictated using voice recognition software.  Despite best  efforts to proofread,  errors can occur which can change meaning.     Jeanmarie PlantJames A Marque Rademaker, MD 06/09/16 [phone]

## 2016-06-09 NOTE — Transfer of Care (Signed)
Immediate Anesthesia Transfer of Care Note  Patient: Stacey Santiago  Procedure(s) Performed: Procedure(s): APPENDECTOMY LAPAROSCOPIC (N/A)  Patient Location: PACU  Anesthesia Type:General  Level of Consciousness: sedated  Airway & Oxygen Therapy: Patient Spontanous Breathing and Patient connected to face mask oxygen  Post-op Assessment: Report given to RN and Post -op Vital signs reviewed and stable  Post vital signs: Reviewed and stable  Last Vitals:  Filed Vitals:   06/09/16 1700 06/09/16 1933  BP: 108/74 123/77  Pulse: 79 80  Temp:  37 C  Resp: 17 20    Complications: No apparent anesthesia complications

## 2016-06-09 NOTE — ED Notes (Signed)
Patient [facility] to the ED [facility]  Patient had a CT of her abdomen done there that showed acute appendicitis.  Patient is in no obvious distress at this time.  Patient states pain currently feels like a "dull annoyance".

## 2016-06-09 NOTE — ED Notes (Signed)
Surgeon at bedside.  

## 2016-06-09 NOTE — Op Note (Signed)
laparascopic appendectomy   Ryland Joy Date of operation:  06/09/2016  Indications: The patient presented with a history of  abdominal pain. Workup has revealed findings consistent with acute appendicitis.  Pre-operative Diagnosis: Acute appendicitis without mention of peritonitis  Post-operative Diagnosis: Same  Surgeon: Leonette Mostharles T. Tonita CongWoodham, MD, FACS  Anesthesia: General with endotracheal tube  Procedure Details  The patient was seen again in the preop area. The options of surgery versus observation were reviewed with the patient and/or family. The risks of bleeding, infection, recurrence of symptoms, negative laparoscopy, potential for an open procedure, bowel injury, abscess or infection, were all reviewed as well. The patient was taken to Operating Room, identified as Stacey Santiago and the procedure verified as laparoscopic appendectomy. A Time Out was held and the above information confirmed.  The patient was placed in the supine position and general anesthesia was induced.  Antibiotic prophylaxis was administered and VT E prophylaxis was in place. A Foley catheter was placed by the nursing staff.   The abdomen was prepped and draped in a sterile fashion. An infraumbilical incision was made. A Veress needle was placed and pneumoperitoneum was obtained. A 5 mm trocar port was placed without difficulty and the abdominal cavity was explored.  Under direct vision a 5 mm suprapubic port was placed and a 12 mm left lateral port was placed all under direct vision.  The appendix was identified and found to be acutely inflamed and attached to the lateral sidewall of the ascending colon. The appendix was carefully dissected. The base of the appendix was dissected out and divided with a standard load Endo GIA. The mesoappendix was divided with a Ligasure device.  The appendix was passed out through the left lateral port site with the aid of an Endo Catch bag. The right lower quadrant and pelvis was then  irrigated with copious amounts of normal saline which was aspirated. Inspection  failed to identify any additional bleeding and there were no signs of bowel injury. The omentum was draped over the prior appendix site.  Again the right lower quadrant was inspected there was no sign of bleeding or bowel injury therefore pneumoperitoneum was released, all ports were removed and the skin incisions were approximated with subcuticular 4-0 Monocryl. Steri-Strips and Mastisol and sterile dressings were placed.  The patient tolerated the procedure well, there were no complications. The sponge lap and needle count were correct at the end of the procedure.  The patient was taken to the recovery room in stable condition to be admitted for continued care.  Findings: Acute appendicitis  Estimated Blood Loss: 20 mL                  Specimens: appendix         Complications:  None                  Stacey Frameharles Jaxon Flatt MD, FACS

## 2016-06-09 NOTE — Anesthesia Procedure Notes (Signed)
Procedure Name: Intubation Date/Time: 06/09/2016 6:25 PM Performed by: Stormy FabianURTIS, Kiah Keay Pre-anesthesia Checklist: Patient identified, Patient being monitored, Timeout performed, Emergency Drugs available and Suction available Patient Re-evaluated:Patient Re-evaluated prior to inductionOxygen Delivery Method: Circle system utilized Preoxygenation: Pre-oxygenation with 100% oxygen Intubation Type: IV induction Ventilation: Mask ventilation without difficulty Laryngoscope Size: Mac and 3 Grade View: Grade I Tube type: Oral Tube size: 7.0 mm Number of attempts: 1 Airway Equipment and Method: Stylet Placement Confirmation: ETT inserted through vocal cords under direct vision,  positive ETCO2 and breath sounds checked- equal and bilateral Secured at: 22 cm Tube secured with: Tape Dental Injury: Teeth and Oropharynx as per pre-operative assessment

## 2016-06-09 NOTE — Anesthesia Preprocedure Evaluation (Signed)
Anesthesia Evaluation  Patient identified by MRN, date of birth, ID band Patient awake    Reviewed: Allergy & Precautions, H&P , NPO status , Patient's Chart, lab work & pertinent test results  History of Anesthesia Complications Negative for: history of anesthetic complications  Airway Mallampati: II  TM Distance: >3 FB Neck ROM: full    Dental  (+) Poor Dentition   Pulmonary neg pulmonary ROS, neg shortness of breath,    Pulmonary exam normal breath sounds clear to auscultation       Cardiovascular Exercise Tolerance: Good (-) angina(-) Past MI and (-) DOE negative cardio ROS Normal cardiovascular exam Rhythm:regular Rate:Normal     Neuro/Psych PSYCHIATRIC DISORDERS Anxiety Depression negative neurological ROS     GI/Hepatic Neg liver ROS, GERD  Controlled,  Endo/Other  negative endocrine ROS  Renal/GU negative Renal ROS  negative genitourinary   Musculoskeletal   Abdominal   Peds  Hematology negative hematology ROS (+)   Anesthesia Other Findings Past Medical History:   Anxiety                                                      GERD (gastroesophageal reflux disease)                       Depression                                                  Past Surgical History:   WISDOM TOOTH EXTRACTION                          19 yrs. o*  BMI    Body Mass Index   32.11 kg/m 2      Reproductive/Obstetrics negative OB ROS                             Anesthesia Physical Anesthesia Plan  ASA: III  Anesthesia Plan: General ETT   Post-op Pain Management:    Induction:   Airway Management Planned:   Additional Equipment:   Intra-op Plan:   Post-operative Plan:   Informed Consent: I have reviewed the patients History and Physical, chart, labs and discussed the procedure including the risks, benefits and alternatives for the proposed anesthesia with the patient or authorized  representative who has indicated his/her understanding and acceptance.   Dental Advisory Given  Plan Discussed with: Anesthesiologist, CRNA and Surgeon  Anesthesia Plan Comments:         Anesthesia Quick Evaluation

## 2016-06-09 NOTE — Anesthesia Postprocedure Evaluation (Signed)
Anesthesia Post Note  Patient: Stacey Santiago  Procedure(s) Performed: Procedure(s) (LRB): APPENDECTOMY LAPAROSCOPIC (N/A)  Patient location during evaluation: PACU Anesthesia Type: General Level of consciousness: awake and alert Pain management: pain level controlled Vital Signs Assessment: post-procedure vital signs reviewed and stable Respiratory status: spontaneous breathing, nonlabored ventilation, respiratory function stable and patient connected to nasal cannula oxygen Cardiovascular status: blood pressure returned to baseline and stable Postop Assessment: no signs of nausea or vomiting Anesthetic complications: no    Last Vitals:  Filed Vitals:   06/09/16 2020 06/09/16 2126  BP: 124/74 129/74  Pulse: 66 67  Temp: 36.6 C 36.7 C  Resp: 16 16    Last Pain:  Filed Vitals:   06/09/16 2126  PainSc: 0-No pain                 Cleda MccreedyJoseph K Piscitello

## 2016-06-09 NOTE — ED Notes (Signed)
Patient picked up by OR staff

## 2016-06-09 NOTE — Brief Op Note (Signed)
06/09/2016  7:23 PM  PATIENT:  Stacey MiyamotoMegan Santiago  35 y.o. female  PRE-OPERATIVE DIAGNOSIS:  appendicitis  POST-OPERATIVE DIAGNOSIS:  appendicitis  PROCEDURE:  Procedure(s): APPENDECTOMY LAPAROSCOPIC (N/A)  SURGEON:  Surgeon(s) and Role:    * Ricarda Frameharles Kijana Cromie, MD - Primary  PHYSICIAN ASSISTANT:   ASSISTANTS: none   ANESTHESIA:   general  EBL:     BLOOD ADMINISTERED:none  DRAINS: none   LOCAL MEDICATIONS USED:  MARCAINE   , XYLOCAINE  and Amount: 20 ml  SPECIMEN:  Source of Specimen:  appendix  DISPOSITION OF SPECIMEN:  PATHOLOGY  COUNTS:  YES  TOURNIQUET:  * No tourniquets in log *  DICTATION: .Dragon Dictation  PLAN OF CARE: Admit for overnight observation  PATIENT DISPOSITION:  PACU - hemodynamically stable.   Delay start of Pharmacological VTE agent (>24hrs) due to surgical blood loss or risk of bleeding: no

## 2016-06-09 NOTE — ED Notes (Addendum)
Lab called and said the blue top I drew had insufficient quantity of blood to be processed.  I informed Mindy, RN to redraw if PT/INR was still needed.  Also, patient had not had urine collected for upreg test.

## 2016-06-09 NOTE — H&P (Signed)
Patient ID: Stacey Santiago, female   DOB: 13-Nov-1981, 35 y.o.   MRN: [id number]  CC: ABDOMINAL PAIN  HPI Stacey MiyamotoMegan Santiago is a 35 y.o. female who presents emergency department after being evaluated in urgent care. She states she's had a 2 day history of vague abdominal pain and diarrhea. CT scan of the urgent care revealed evidence of appendicitis. Patient states she's never had pain like this before this more of a dull ache in her right lower quadrant. The pain has not radiated or move since it started. She denies any fevers, chills, nausea, vomiting, chest pain, shortness of breath, constipation. She is otherwise in her usual state of excellent health.  HPI  Past Medical History  Diagnosis Date  . Anxiety   . GERD (gastroesophageal reflux disease)   . Depression     Past Surgical History  Procedure Laterality Date  . Wisdom tooth extraction  19 yrs. of age    Family History  Problem Relation Age of Onset  . Diabetes Mother   . Hypertension Mother   . Hyperlipidemia Mother   . Hypertension Father   . Hyperlipidemia Father   . Diabetes Maternal Grandfather     Social History Social History  Substance Use Topics  . Smoking status: Never Smoker   . Smokeless tobacco: Never Used  . Alcohol Use: Yes     Comment: social    No Known Allergies  Current Facility-Administered Medications  Medication Dose Route Frequency Provider Last Rate Last Dose  . piperacillin-tazobactam (ZOSYN) IVPB 3.375 g  3.375 g Intravenous Once Jeanmarie PlantJames A McShane, MD 100 mL/hr at 06/09/16 1730 3.375 g at 06/09/16 1730   Current Outpatient Prescriptions  Medication Sig Dispense Refill  . desogestrel-ethinyl estradiol (APRI) 0.15-30 MG-MCG tablet Take 1 tablet by mouth daily. 28 tablet 12  . escitalopram (LEXAPRO) 10 MG tablet Take 1 tablet (10 mg total) by mouth daily. 90 tablet 3     Review of Systems A Multi-point review of systems was asked and was negative except for the findings documented in the history  of present illness  Physical Exam Blood pressure 108/74, pulse 79, temperature 98.7 F (37.1 C), temperature source Oral, resp. rate 17, height 5\' 5"  (1.651 m), weight 87.544 kg (193 lb), last menstrual period 05/18/2016, SpO2 100 %. CONSTITUTIONAL: No acute distress. EYES: Pupils are equal, round, and reactive to light, Sclera are non-icteric. EARS, NOSE, MOUTH AND THROAT: The oropharynx is clear. The oral mucosa is pink and moist. Hearing is intact to voice. LYMPH NODES:  Lymph nodes in the neck are normal. RESPIRATORY:  Lungs are clear. There is normal respiratory effort, with equal breath sounds bilaterally, and without pathologic use of accessory muscles. CARDIOVASCULAR: Heart is regular without murmurs, gallops, or rubs. GI: The abdomen is soft, tender to deep palpation in the right lower quadrant with a positive McBurney's but negative Rovsing sign, and nondistended. There are no palpable masses. There is no hepatosplenomegaly. There are normal bowel sounds in all quadrants. GU: Rectal deferred.   MUSCULOSKELETAL: Normal muscle strength and tone. No cyanosis or edema.   SKIN: Turgor is good and there are no pathologic skin lesions or ulcers. NEUROLOGIC: Motor and sensation is grossly normal. Cranial nerves are grossly intact. PSYCH:  Oriented to person, place and time. Affect is normal.  Data Reviewed Images and labs reviewed with labs concerning for a leukocytosis of 15.6. CT scan of the abdomen does show a dilated, thickened, inflamed appendix without evidence of rupture or abscess. have personally  reviewed the patient's imaging, laboratory findings and medical records.    Assessment    Acute appendicitis     Plan     35 year old female with acute appendicitis. Discussed her treatment options in detail to include laparoscopic appendectomy versus treatment with IV antibiotics. After this discussion patient elects to proceed to surgery with a laparoscopic appendectomy. The  procedure was described in detail to include the risks, benefits, alternatives. She voiced understanding and wishes to proceed. Plan for admission for observation postoperatively. All questions answered to patient's satisfaction.     Time spent with the patient was 55 minutes, with more than 50% of the time spent in face-to-face education, counseling and care coordination.     Ricarda Frameharles Sarayah Bacchi, MD FACS General Surgeon 06/09/2016, 5:43 PM

## 2016-06-09 NOTE — ED Notes (Signed)
Blood bank called refusing her sample because a blue sticker was not affixed to the tube, I had secured the whole ribbon to the tube.  She said the OR nurse could redraw and use the sticker off the patients armband to affix to the new sample.  I informed Education officer, museumMindy RN.

## 2016-06-10 LAB — BASIC METABOLIC PANEL
ANION GAP: 6 (ref 5–15)
BUN: 7 mg/dL (ref 6–20)
CHLORIDE: 110 mmol/L (ref 101–111)
CO2: 24 mmol/L (ref 22–32)
Calcium: 8.4 mg/dL — ABNORMAL LOW (ref 8.9–10.3)
Creatinine, Ser: 0.86 mg/dL (ref 0.44–1.00)
GFR calc Af Amer: >60 mL/min (ref >60)
GFR calc non Af Amer: >60 mL/min (ref >60)
GLUCOSE: 133 mg/dL — AB (ref 65–99)
POTASSIUM: 3.8 mmol/L (ref 3.5–5.1)
Sodium: 140 mmol/L (ref 135–145)

## 2016-06-10 LAB — CBC
HEMATOCRIT: 31.7 % — AB (ref 35.0–47.0)
Hemoglobin: 10.9 g/dL — ABNORMAL LOW (ref 12.0–16.0)
MCH: 31.4 pg (ref 26.0–34.0)
MCHC: 34.4 g/dL (ref 32.0–36.0)
MCV: 91.5 fL (ref 80.0–100.0)
Platelets: 227 10*3/uL (ref 150–440)
RBC: 3.47 MIL/uL — AB (ref 3.80–5.20)
RDW: 13.4 % (ref 11.5–14.5)
WBC: 10.7 10*3/uL (ref 3.6–11.0)

## 2016-06-10 MED ORDER — HYDROCODONE-ACETAMINOPHEN 5-325 MG PO TABS: 1.0000 | ORAL_TABLET | ORAL | Status: DC | PRN

## 2016-06-10 NOTE — Progress Notes (Signed)
Pt d/c home; d/c instructions reviewed w/ pt; pt understanding was verbalized; IV removed catheter in tact, gauze dressing applied; incision dressings CDI at time of d/c; all pt questions answered; pt left unit via wheelchair accompanied by staff 

## 2016-06-10 NOTE — Discharge Instructions (Signed)
Follow all MD discharge instructions. Take all medications as prescribed. Keep all follow up appointments. If your symptoms return, call your doctor. If you experience any new symptoms that are of concern to you or that are bothersome to you, call your doctor. For all questions and/or concerns, call your doctor.  If you experience any bleeding or discharge from your incision sites, redness or swelling at your incision sites, fever, pain uncontrolled by medication, increase in pain, or any nausea, call your doctor.    If you have a medical emergency, call 911   Laparoscopic Appendectomy, Adult Appendectomy is surgery to remove the appendix. Laparoscopic surgery uses several small cuts (incisions) instead of one large incision. Laparoscopic surgery offers a shorter recovery time and less discomfort. LET YOUR CAREGIVER KNOW ABOUT:  Allergies to food or medicine.  Medicines taken, including vitamins, dietary supplements, herbs, eyedrops, over-the-counter medicines, and creams.  Use of steroids (by mouth or creams).  Previous problems with anesthetics or numbing medicines.  History of bleeding problems or blood clots.  Previous surgery.  Other health problems, including diabetes, heart problems, lung problems, and kidney problems.  Possibility of pregnancy, if this applies. RISKS AND COMPLICATIONS  Infection. A germ starts growing in the wound. This can usually be treated with antibiotics. In some cases, the wound will need to be opened and cleaned.  Bleeding.  Damage to other organs.  Sores (abscesses).  Chronic pain at the incision sites. This is defined as pain that lasts for more than 3 months.  Blood clots in the legs that may rarely travel to the lungs.  Infection in the lungs (pneumonia). BEFORE THE PROCEDURE Appendectomy is usually performed immediately after an inflamed appendix (appendicitis) is diagnosed. No preparation is necessary ahead of this procedure. PROCEDURE    You will be given medicine that makes you sleep (general anesthetic). After you are asleep, a flexible tube (catheter) may be inserted into your bladder to drain your urine during surgery. The tube is removed before you wake up after surgery. When you are asleep, carbondioxide gas will be used to inflate your abdomen. This will allow your surgeon to see inside your abdomen and perform your surgery. Three small incisions will be made in your abdomen. Your surgeon will insert a thin, lighted tube (laparoscope) through one of the incisions. Your surgeon will look through the laparoscope while performing the surgery. Other tools will be inserted through the other incisions. Laparoscopic procedures may not be appropriate when:  There is major scarring from a previous surgery.  The patient has bleeding disorders.  A pregnancy is near term.  There are other conditions which make the laparoscopic procedure impossible, such as an advanced infection or a ruptured appendix. If your surgeon feels it is not safe to continue with the laparoscopic procedure, he or she will perform an open surgery instead. This gives the surgeon a larger view and more space to work. Open surgery requires a longer recovery time. After your appendix is removed, your incisions will be closed with stitches (sutures) or skin adhesive. AFTER THE PROCEDURE You will be taken to a recovery room. When the anesthesia has worn off, you will be returned to your hospital room. You will be given pain medicines to keep you comfortable. Ask your caregiver how long your hospital stay will be.   This information is not intended to replace advice given to you by your health care provider. Make sure you discuss any questions you have with your health care provider.  Document Released: 07/11/2004 Document Revised: 12/18/2014 Document Reviewed: 05/17/2015 Elsevier Interactive Patient Education 2016 Elsevier Inc.    Laparoscopic Appendectomy,  Adult, Care After Refer to this sheet in the next few weeks. These instructions provide you with information on caring for yourself after your procedure. Your caregiver may also give you more specific instructions. Your treatment has been planned according to current medical practices, but problems sometimes occur. Call your caregiver if you have any problems or questions after your procedure. HOME CARE INSTRUCTIONS  Do not drive while taking narcotic pain medicines.  Use stool softener if you become constipated from your pain medicines.  Change your bandages (dressings) as directed.  Keep your wounds clean and dry. You may wash the wounds gently with soap and water. Gently pat the wounds dry with a clean towel.  Do not take baths, swim, or use hot tubs for 10 days, or as instructed by your caregiver.  Only take over-the-counter or prescription medicines for pain, discomfort, or fever as directed by your caregiver.  You may continue your normal diet as directed.  Do not lift more than 10 pounds (4.5 kg) or play contact sports for 3 weeks, or as directed.  Slowly increase your activity after surgery.  Take deep breaths to avoid getting a lung infection (pneumonia). SEEK MEDICAL CARE IF:  You have redness, swelling, or increasing pain in your wounds.  You have pus coming from your wounds.  You have drainage from a wound that lasts longer than 1 day.  You notice a bad smell coming from the wounds or dressing.  Your wound edges break open after stitches (sutures) have been removed.  You notice increasing pain in the shoulders (shoulder strap areas) or near your shoulder blades.  You develop dizzy episodes or fainting while standing.  You develop shortness of breath.  You develop persistent nausea or vomiting.  You cannot control your bowel functions or lose your appetite.  You develop diarrhea. SEEK IMMEDIATE MEDICAL CARE IF:   You have a fever.  You develop a  rash.  You have difficulty breathing or sharp pains in your chest.  You develop any reaction or side effects to medicines given. MAKE SURE YOU:  Understand these instructions.  Will watch your condition.  Will get help right away if you are not doing well or get worse.   This information is not intended to replace advice given to you by your health care provider. Make sure you discuss any questions you have with your health care provider.   Document Released: 11/27/2005 Document Revised: 04/13/2015 Document Reviewed: 05/17/2015 Elsevier Interactive Patient Education [employer]2016 Elsevier Inc.

## 2016-06-10 NOTE — Progress Notes (Signed)
CC: POD#1 s/p lap appy  Subjective: Doing well, denies F/C, denies N/V.  Afebrile.  Ambulating.  Tol diet.  Objective: Vital signs in last 24 hours: Temp:  [97.9 F (36.6 C)-98.7 F (37.1 C)] 98 F (36.7 C) (07/01 0501) Pulse Rate:  [58-80] 58 (07/01 0501) Resp:  [16-20] 16 (07/01 0501) BP: (101-129)/(60-77) 101/60 mmHg (07/01 0501) SpO2:  [100 %] 100 % (07/01 0501) Weight:  [193 lb (87.544 kg)-196 lb 8 oz (89.132 kg)] 196 lb 8 oz (89.132 kg) (06/30 2020) Last BM Date: 06/09/16  Intake/Output from previous day: 06/30 0701 - 07/01 0700 In: 1480 [P.O.:480; I.V.:1000] Out: 300 [Urine:300] Intake/Output this shift: Total I/O In: 1480 [P.O.:480; I.V.:1000] Out: 300 [Urine:300]  Physical exam:  A+Ox3 NAD Unlabored RRR Soft, ND, TTP app to incision  Lab Results: CBC   Recent Labs  06/09/16 1715  WBC 15.6*  HGB 12.7  HCT 38.2  PLT 268   BMET  Recent Labs  06/09/16 1715  NA 136  K 3.6  CL 105  CO2 22  GLUCOSE 87  BUN 8  CREATININE 0.84  CALCIUM 9.3   PT/INR No results for input(s): LABPROT, INR in the last 72 hours. ABG No results for input(s): PHART, HCO3 in the last 72 hours.  Invalid input(s): PCO2, PO2  Studies/Results: Ct Abdomen Pelvis W Contrast  06/09/2016  CLINICAL DATA:  Right lower quadrant pain for 2 days. These results will be called to the ordering clinician or representative by the Radiology Department at the imaging location.Patient has returned to clinic for results. EXAM: CT ABDOMEN AND PELVIS WITH CONTRAST TECHNIQUE: Multidetector CT imaging of the abdomen and pelvis was performed using the standard protocol following bolus administration of intravenous contrast. CONTRAST:  100mL ISOVUE-300 IOPAMIDOL (ISOVUE-300) INJECTION 61% COMPARISON:  None. FINDINGS: Lower chest and abdominal wall:  No contributory findings. Hepatobiliary: No focal liver abnormality.No evidence of biliary obstruction or stone. Pancreas: Unremarkable. Spleen:  Unremarkable. Adrenals/Urinary Tract: Negative adrenals. No hydronephrosis or stone. Unremarkable bladder. Stomach/Bowel: Dilated fluid-filled appendix with mesoappendiceal inflammation. Outer wall diameter is 11 mm. Appendicolith present. Associated submucosal swelling at the base of the cecum. No perforation or abscess. The appendix extends posteriorly and superiorly behind the colon. Reproductive:No pathologic findings. Vascular/Lymphatic: No acute vascular abnormality. No mass or adenopathy. Other: No ascites or pneumoperitoneum. Musculoskeletal: Dysmorphic L5 left articular process. IMPRESSION: Acute suppurative appendicitis, non perforated. Appendix is retrocecal. Electronically Signed   By: Marnee SpringJonathon  Watts M.D.   On: 06/09/2016 16:28    Anti-infectives: Anti-infectives    Start     Dose/Rate Route Frequency Ordered Stop   06/09/16 2100  piperacillin-tazobactam (ZOSYN) IVPB 3.375 g     3.375 g 12.5 mL/hr over 240 Minutes Intravenous Every 8 hours 06/09/16 2025 06/10/16 1259   06/09/16 2030  piperacillin-tazobactam (ZOSYN) IVPB 3.375 g  Status:  Discontinued     3.375 g 100 mL/hr over 30 Minutes Intravenous Every 6 hours 06/09/16 2022 06/09/16 2024   06/09/16 1700  piperacillin-tazobactam (ZOSYN) IVPB 3.375 g     3.375 g 100 mL/hr over 30 Minutes Intravenous  Once 06/09/16 1655 06/09/16 1800      Assessment/Plan:  POD#1 s/p lap appy  -home today -d/c abx -f/u in 2 weeks with Dr. Woodham  Italyhad Maysel Mccolm MD 06/10/2016

## 2016-06-12 ENCOUNTER — Encounter: Payer: Self-pay | Admitting: General Surgery

## 2016-06-14 LAB — SURGICAL PATHOLOGY

## 2016-06-16 LAB — POCT PREGNANCY, URINE: PREG TEST UR: NEGATIVE

## 2016-06-26 ENCOUNTER — Ambulatory Visit (INDEPENDENT_AMBULATORY_CARE_PROVIDER_SITE_OTHER): Payer: 59 | Admitting: General Surgery

## 2016-06-26 ENCOUNTER — Encounter: Payer: Self-pay | Admitting: General Surgery

## 2016-06-26 VITALS — BP 126/71 | HR 78 | Temp 99.5°F | Ht 65.0 in | Wt 194.0 lb

## 2016-06-26 DIAGNOSIS — Z4889 Encounter for other specified surgical aftercare: Secondary | ICD-10-CM

## 2016-06-26 NOTE — Progress Notes (Signed)
Outpatient Surgical Follow Up  06/26/2016  Stacey Santiago is an 35 y.o. female.   Chief Complaint  Patient presents with  . Routine Post Op    Laparoscopy Appendectomy-Dr.Shawntez Dickison-06/09/16    HPI: 35 year old female returns to clinic for follow-up 3 weeks status post laparoscopic. Patient denies any pain. She has been eating well and having normal bowel function. She denies any fevers, chills, nausea, vomiting, chest pain, shortness breath, diarrhea, constipation. She's been very happy with her surgical experience.  Past Medical History  Diagnosis Date  . Anxiety   . GERD (gastroesophageal reflux disease)   . Depression     Past Surgical History  Procedure Laterality Date  . Wisdom tooth extraction  19 yrs. of age  . Laparoscopic appendectomy N/A 06/09/2016    Procedure: APPENDECTOMY LAPAROSCOPIC;  Surgeon: Ricarda Frameharles Dreshawn Hendershott, MD;  Location: [facility] ORS;  Service: General;  Laterality: N/A;    Family History  Problem Relation Age of Onset  . Diabetes Mother   . Hypertension Mother   . Hyperlipidemia Mother   . Hypertension Father   . Hyperlipidemia Father   . Diabetes Maternal Grandfather     Social History:  reports that she has never smoked. She has never used smokeless tobacco. She reports that she drinks alcohol. She reports that she does not use illicit drugs.  Allergies: No Known Allergies  Medications reviewed.    ROS  A multipoint review of systems was completed, all pertinent positives and negatives are documented within the history of present illness and the remainder are negative.  BP 126/71 mmHg  Pulse 78  Temp(Src) 99.5 F (37.5 C) (Oral)  Ht 5\' 5"  (1.651 m)  Wt 87.998 kg (194 lb)  BMI 32.28 kg/m2  LMP 06/22/2016  Physical Exam Gen.: No acute distress Chest: Clear to auscultation Heart: Regular rate and rhythm Abdomen: Soft, nontender, nondistended. Well approximated laparoscopic incision sites without evidence of erythema or drainage.    No results  found for this or any previous visit (from the past 48 hour(s)). No results found.  Assessment/Plan:  1. Aftercare following surgery 35 year old female status post lap scopic appendectomy. Pathology reviewed with the patient. Discussed signs and symptoms of infection and return to clinic medially should they occur. She voiced understanding all questions answered to her satisfaction and she'll follow up on an as-needed basis.     Ricarda Frameharles Primitivo Merkey, MD FACS General Surgeon  06/26/2016,10:43 AM

## 2016-06-26 NOTE — Patient Instructions (Signed)
Please call our office if you have any questions or concerns.  

## 2016-08-04 IMAGING — CT CT ABD-PELV W/ CM
2 of 4 series · 16 of 46 positions shown, 18 images · IV contrast (iopamidol)
Comparison: None.

CLINICAL DATA: Right lower quadrant pain for 2 days.

These results will be called to the ordering clinician or
representative by the [HOSPITAL] at the imaging
location.Patient has returned to clinic for results.
EXAM:
CT ABDOMEN AND PELVIS WITH CONTRAST
TECHNIQUE: Multidetector CT imaging of the abdomen and pelvis was performed
using the standard protocol following bolus administration of
intravenous contrast.
CONTRAST:  100mL [id number] IOPAMIDOL ([id number]) INJECTION 61%

[Series 2: routine abd pel with · axial · 0.69mm/px · z∈[-474,-44]mm · 13 of 94 slices shown, 15 images]
[im 4/94  soft-tissue]
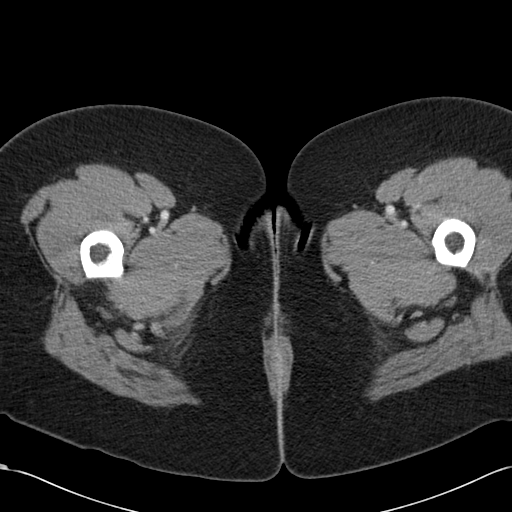
[im 4/94  bone]
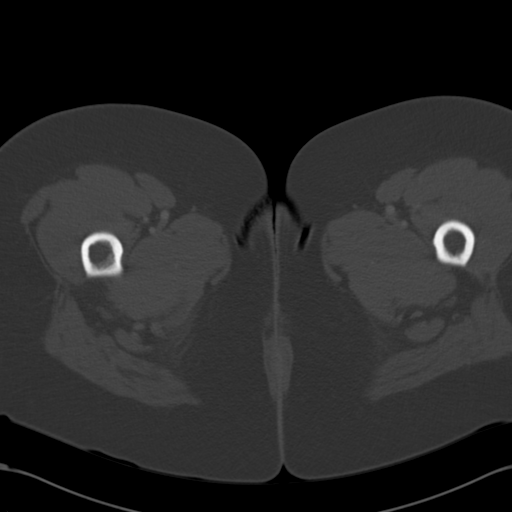
[im 12/94  soft-tissue]
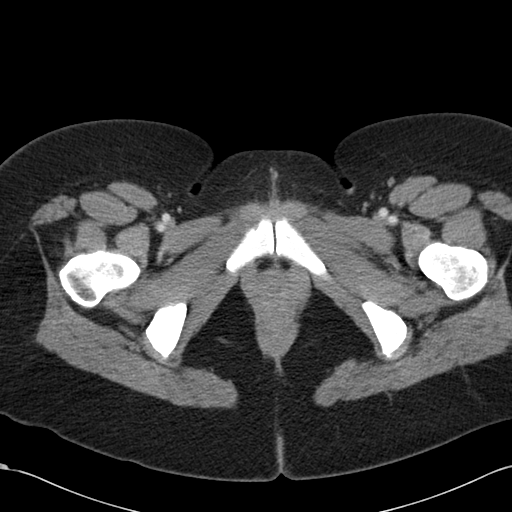
[im 20/94  soft-tissue]
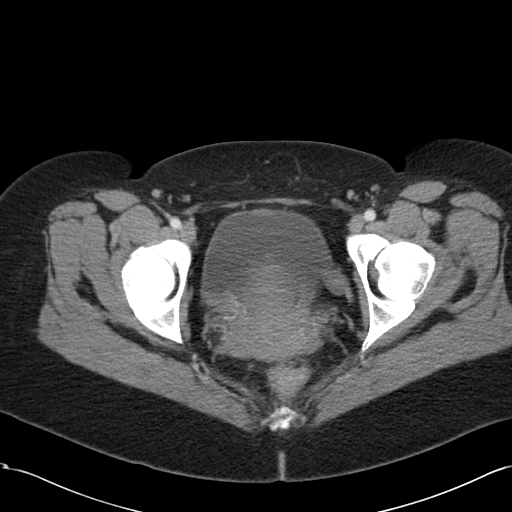
[im 28/94  soft-tissue]
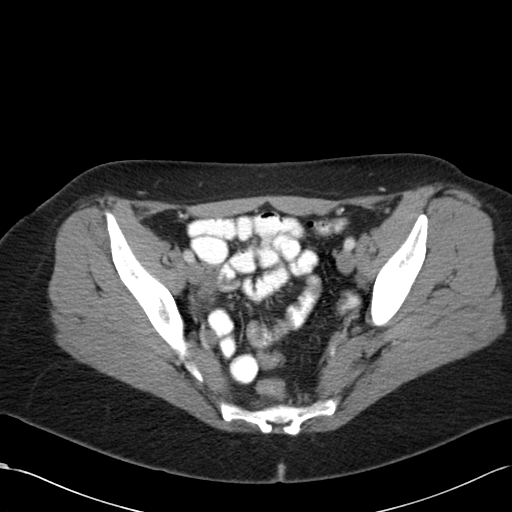
[im 32/94  soft-tissue]
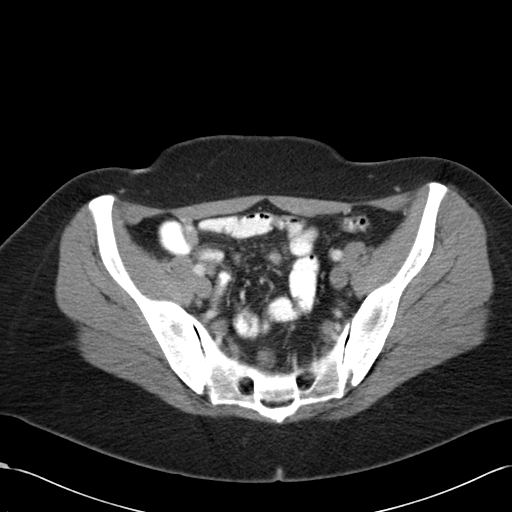
[im 39/94  soft-tissue]
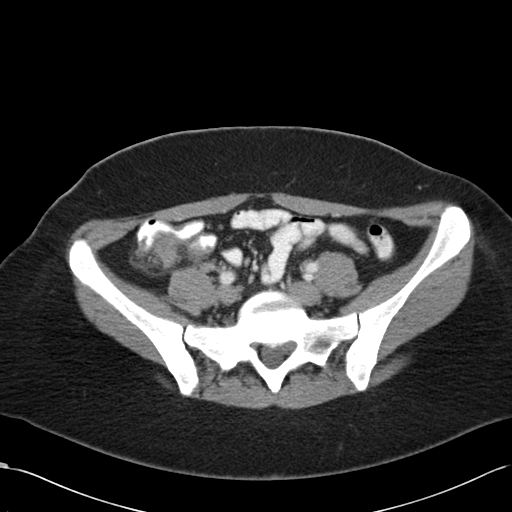
[im 47/94  soft-tissue]
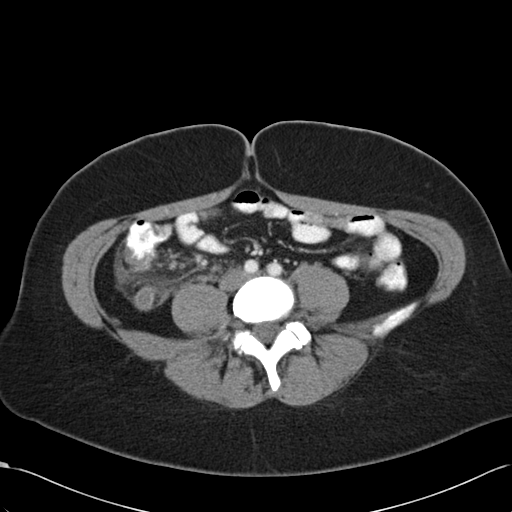
[im 55/94  soft-tissue]
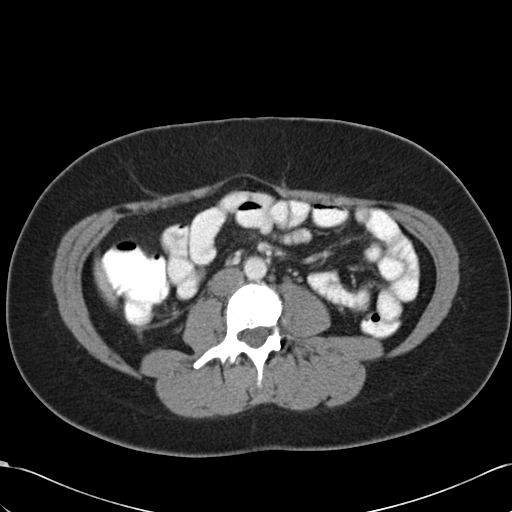
[im 63/94  soft-tissue]
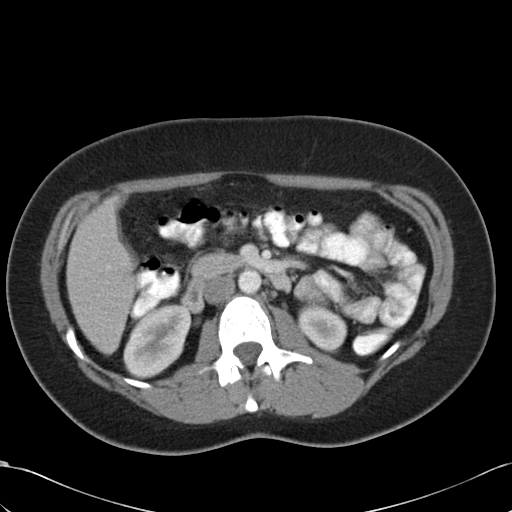
[im 63/94  bone]
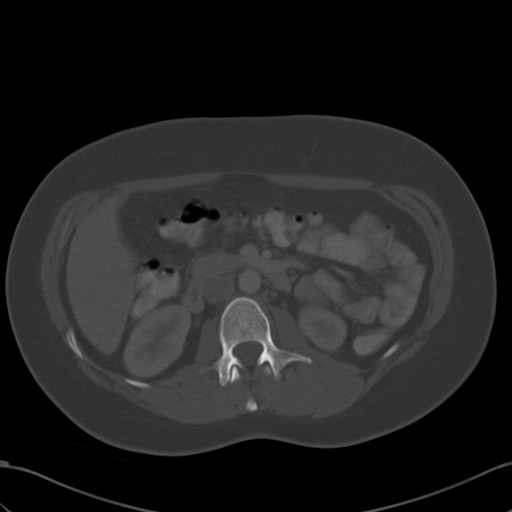
[im 66/94  soft-tissue]
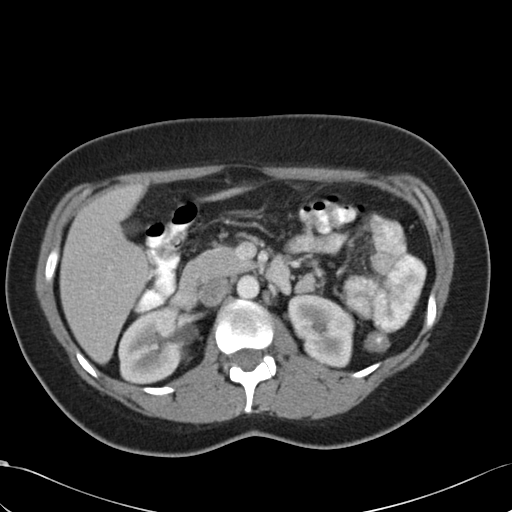
[im 74/94  soft-tissue]
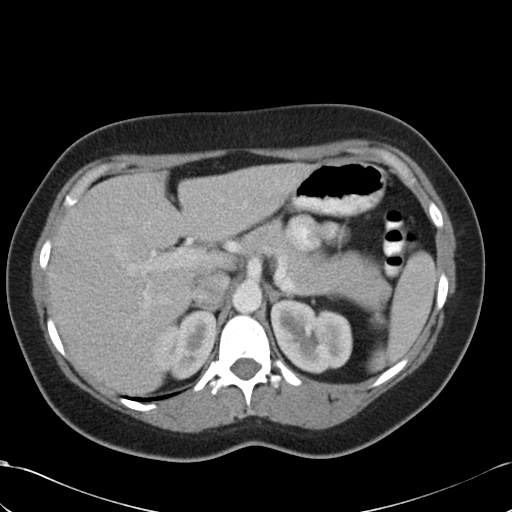
[im 82/94  soft-tissue]
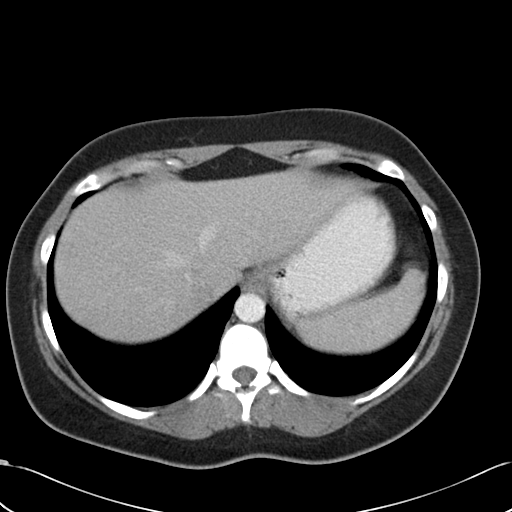
[im 90/94  soft-tissue]
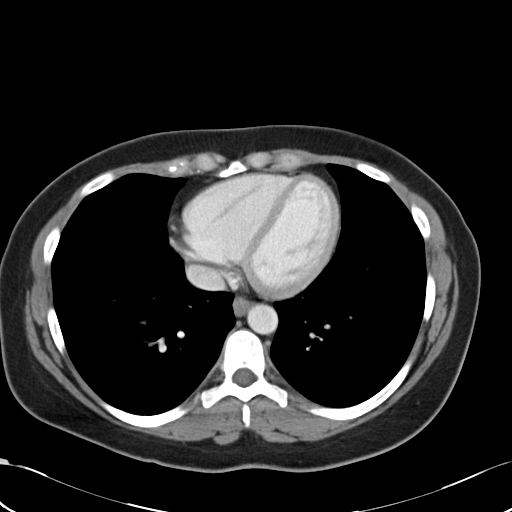

[Series 5: cor routine abd pel with · coronal · 0.72mm/px · 3 of 129 slices shown]
[im 43/129  soft-tissue]
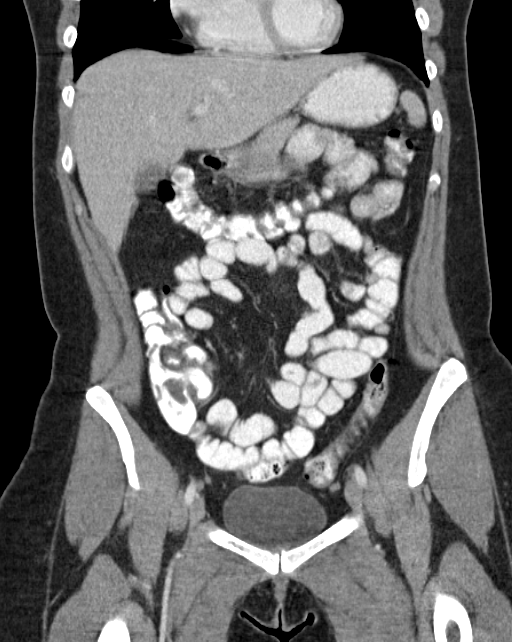
[im 57/129  soft-tissue]
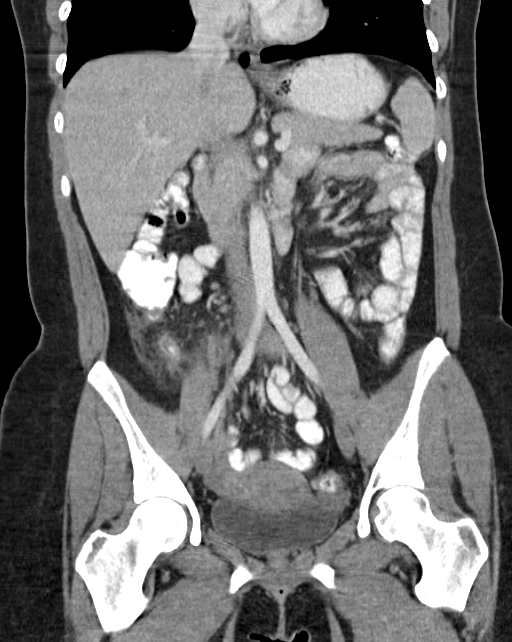
[im 72/129  soft-tissue]
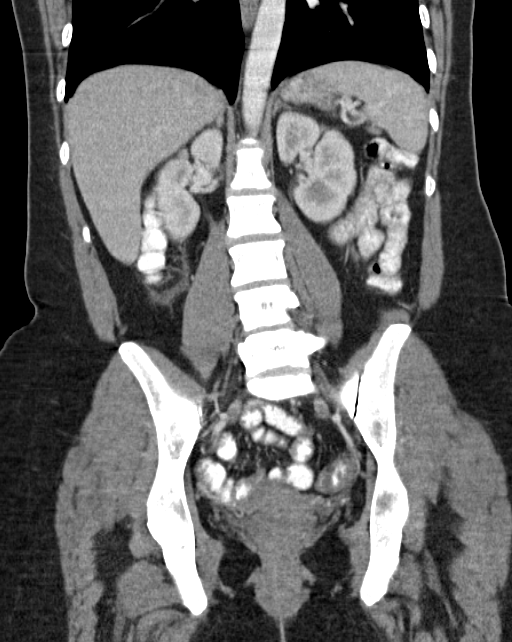

[16 of 46 positions shown; findings below may reference images not displayed]

FINDINGS: Lower chest and abdominal wall:  No contributory findings.

Hepatobiliary: No focal liver abnormality.No evidence of biliary
obstruction or stone.

Pancreas: Unremarkable.

Spleen: Unremarkable.

Adrenals/Urinary Tract: Negative adrenals. No hydronephrosis or
stone. Unremarkable bladder.

Stomach/Bowel: Dilated fluid-filled appendix with mesoappendiceal
inflammation. Outer wall diameter is 11 mm. Appendicolith present.
Associated submucosal swelling at the base of the cecum. No
perforation or abscess. The appendix extends posteriorly and
superiorly behind the colon.

Reproductive:No pathologic findings.

Vascular/Lymphatic: No acute vascular abnormality. No mass or
adenopathy.

Other: No ascites or pneumoperitoneum.

Musculoskeletal: Dysmorphic L5 left articular process.
IMPRESSION: Acute suppurative appendicitis, non perforated. Appendix is
retrocecal.

## 2016-10-04 ENCOUNTER — Encounter: Payer: Self-pay | Admitting: Physician Assistant

## 2016-10-04 ENCOUNTER — Ambulatory Visit (INDEPENDENT_AMBULATORY_CARE_PROVIDER_SITE_OTHER): Payer: 59 | Admitting: Physician Assistant

## 2016-10-04 VITALS — BP 120/72 | HR 79 | Temp 98.2°F | Resp 16 | Wt 203.8 lb

## 2016-10-04 DIAGNOSIS — Z23 Encounter for immunization: Secondary | ICD-10-CM

## 2016-10-04 DIAGNOSIS — S83412A Sprain of medial collateral ligament of left knee, initial encounter: Secondary | ICD-10-CM | POA: Diagnosis not present

## 2016-10-04 DIAGNOSIS — Z0189 Encounter for other specified special examinations: Secondary | ICD-10-CM

## 2016-10-04 DIAGNOSIS — E6609 Other obesity due to excess calories: Secondary | ICD-10-CM | POA: Diagnosis not present

## 2016-10-04 DIAGNOSIS — Z6833 Body mass index (BMI) 33.0-33.9, adult: Secondary | ICD-10-CM | POA: Diagnosis not present

## 2016-10-04 DIAGNOSIS — Z008 Encounter for other general examination: Secondary | ICD-10-CM

## 2016-10-04 NOTE — Patient Instructions (Signed)

## 2016-10-04 NOTE — Progress Notes (Signed)
       Patient: Stacey MiyamotoMegan Creek Female    DOB: [birth year]   35 y.o.   MRN: [id number] Visit Date: 10/04/2016  Today's Provider: Margaretann LovelessJennifer M Burnette, PA-C   Chief Complaint  Patient presents with  . Health Screening Form   Subjective:    HPI  Patient is here for her health Screening form to be fill out and for Influenza vaccine.  Patient is also with c/o of pain underneath her left knee when sitting for a long time/Indian style. She reports that she fell 2 weeks ago.  Adjustment Disorder with anxious mood: Patient reports she is doing well. No symptoms. She is on Lexapro.    No Known Allergies   Current Outpatient Prescriptions:  .  desogestrel-ethinyl estradiol (APRI) 0.15-30 MG-MCG tablet, Take 1 tablet by mouth daily., Disp: 28 tablet, Rfl: 12 .  escitalopram (LEXAPRO) 10 MG tablet, Take 1 tablet (10 mg total) by mouth daily., Disp: 90 tablet, Rfl: 3  Review of Systems  Constitutional: Negative.   Respiratory: Negative.   Cardiovascular: Negative.   Gastrointestinal: Negative.   Musculoskeletal: Positive for arthralgias.  Neurological: Negative.   Psychiatric/Behavioral: Negative.     Social History  Substance Use Topics  . Smoking status: Never Smoker  . Smokeless tobacco: Never Used  . Alcohol use Yes     Comment: social   Objective:   BP 120/72 (BP Location: Right Arm, Patient Position: Sitting, Cuff Size: Normal)   Pulse 79   Temp 98.2 F (36.8 C) (Oral)   Resp 16   Wt 203 lb 12.8 oz (92.4 kg)   BMI 33.91 kg/m   Physical Exam  Constitutional: She appears well-developed and well-nourished. No distress.  Neck: Normal range of motion. Neck supple.  Cardiovascular: Normal rate, regular rhythm and normal heart sounds.  Exam reveals no gallop and no friction rub.   No murmur heard. Pulmonary/Chest: Effort normal and breath sounds normal. No respiratory distress. She has no wheezes. She has no rales.  Musculoskeletal:       Right knee: Normal.       Left  knee: She exhibits normal range of motion, no swelling, no LCL laxity, normal patellar mobility, no bony tenderness, normal meniscus and no MCL laxity. Tenderness found. Medial joint line and MCL tenderness noted.  Skin: She is not diaphoretic.  Psychiatric: She has a normal mood and affect. Her behavior is normal. Judgment and thought content normal.  Vitals reviewed.     Assessment & Plan:     1. Encounter for biometric screening Form completed. Patient is exercising (walking), has a Control and instrumentation engineerhealth coach, and trying to work on portion sizes.   2. Class 1 obesity due to excess calories without serious comorbidity with body mass index (BMI) of 33.0 to 33.9 in adult See above medical treatment plan.  3. Need for influenza vaccination Flu vaccine given today without complication. Patient sat upright for 15 minutes to check for adverse reaction before being released. - Flu Vaccine QUAD 36+ mos IM  4. Sprain of medial collateral ligament of left knee, initial encounter Start aleve BID x 2 weeks, brace, heat, and stretches. She is to call if no improvement or symptoms worsen.       Margaretann LovelessJennifer M Burnette, PA-C  [facility]

## 2016-11-21 ENCOUNTER — Ambulatory Visit
Admission: RE | Admit: 2016-11-21 | Discharge: 2016-11-21 | Disposition: A | Discharge status: Home / Self Care | Payer: 59 | Source: Ambulatory Visit | Attending: Physician Assistant | Admitting: Physician Assistant

## 2016-11-21 ENCOUNTER — Ambulatory Visit (INDEPENDENT_AMBULATORY_CARE_PROVIDER_SITE_OTHER): Payer: 59 | Admitting: Physician Assistant

## 2016-11-21 ENCOUNTER — Encounter: Payer: Self-pay | Admitting: Physician Assistant

## 2016-11-21 ENCOUNTER — Telehealth: Payer: Self-pay

## 2016-11-21 VITALS — BP 104/72 | HR 76 | Temp 98.3°F | Resp 16 | Wt 206.0 lb

## 2016-11-21 DIAGNOSIS — M25562 Pain in left knee: Secondary | ICD-10-CM | POA: Insufficient documentation

## 2016-11-21 MED ORDER — METHYLPREDNISOLONE 4 MG PO TBPK: ORAL_TABLET | ORAL | 0 refills | Status: DC

## 2016-11-21 NOTE — Telephone Encounter (Signed)
Pt advised. Pt verbally acknowledges understanding and agrees with the treatment plan. Allene DillonEmily Drozdowski, CMA

## 2016-11-21 NOTE — Patient Instructions (Signed)
Knee Sprain, Adult A knee sprain is a stretch or tear in a knee ligament. Knee ligaments are bands of tissue that connect bones in the knee to each other. What are the causes? This condition often results from:  A fall.  An injury to the knee.  What are the signs or symptoms? Symptoms of this condition include:  Trouble bending the leg.  Swelling in the knee.  Bruising around the knee.  Tenderness or pain in the knee.  Muscle spasms around the knee.  How is this diagnosed? This condition may be diagnosed based on:  A physical exam.  What happened just before you started to have symptoms.  Tests, including: ? An X-ray. This may be done to make sure no bones are broken. ? An MRI. This may be done to check if the ligament is torn. ? Stress testing of the knee. This may be done to check ligament damage.  How is this treated? Treatment for this condition may involve:  Keeping the knee still (immobilized) with a cast, brace, or splint.  Applying ice to the knee. This helps with pain and swelling.  Keeping the knee raised (elevated) above the level of your heart when you are resting. This helps with pain and swelling.  Taking medicine for pain.  Exercises to prevent or limit permanent weakness or stiffness in your knee.  Surgery to reconnect the ligament to the bone or to reconstruct it. This may be needed if the ligament tore all the way.  Follow these instructions at home: If you have a splint or brace:  Wear the splint or brace as told by your health care provider. Remove it only as told by your health care provider.  Loosen the splint or brace if your toes tingle, become numb, or turn cold and blue.  Keep the splint or brace clean.  If the splint or brace is not waterproof: ? Do not let it get wet. ? Cover it with a watertight covering when you take a bath or a shower. If you have a cast:  Do not stick anything inside the cast to scratch your skin. Doing  that increases your risk of infection.  Check the skin around the cast every day. Tell your health care provider about any concerns.  You may put lotion on dry skin around the edges of the cast. Do not put lotion on the skin underneath the cast.  Keep the cast clean.  If the cast is not waterproof: ? Do not let it get wet. ? Cover it with a watertight covering when you take a bath or a shower. Managing pain, stiffness, and swelling   If directed, put ice on the injured area. ? If you have a removable splint or brace, remove it as told by your health care provider. ? Put ice in a plastic bag. ? Place a towel between your skin and the bag or between your cast and the bag. ? Leave the ice on for 20 minutes, 2-3 times a day.  Gently move your toes often to avoid stiffness and to lessen swelling.  Elevate the injured area above the level of your heart while you are sitting or lying down.  Take over-the-counter and prescription medicines only as told by your health care provider. General instructions  Do exercises as told by your health care provider.  Keep all follow-up visits as told by your health care provider. This is important. Contact a health care provider if:  You have pain   that gets worse.  The cast, brace, or splint does not fit right.  The cast, brace, or splint gets damaged. Get help right away if:  You cannot use your injured joint to support any of your body weight (cannot bear weight).  You cannot move the injured joint.  You cannot walk more than a few steps without pain or without your knee buckling.  You have significant pain, swelling, or numbness below the cast, brace, or splint. This information is not intended to replace advice given to you by your health care provider. Make sure you discuss any questions you have with your health care provider. Document Released: 11/27/2005 Document Revised: 08/16/2016 Document Reviewed: 06/16/2016 Elsevier  Interactive Patient Education  2017 [employer]  

## 2016-11-21 NOTE — Progress Notes (Signed)
       Patient: Stacey MiyamotoMegan Santiago Female    DOB: 29-Apr-1981   35 y.o.   MRN: [id number] Visit Date: 11/21/2016  Today's Provider: Margaretann LovelessJennifer M Burnette, PA-C   Chief Complaint  Patient presents with  . Knee Pain   Subjective:    HPI   Knee Pain  Pt was seen on 10/04/2016 for sprain of medial collateral ligament of left knee. Pt was advised to take aleve BID x 2 weeks, brace, heat and stretches. Pt has tried the treatment plan, without relief. Pt reports the pain is worsening, and is located in the left popliteal region. Initial injury was a fall going down the stairs as pt was carrying items. Pain is exacerbated by sitting Bangladeshindian style, as well as going from sitting position to standing position after sitting for a while. Feels stiff per pt.     No Known Allergies   Current Outpatient Prescriptions:  .  desogestrel-ethinyl estradiol (APRI) 0.15-30 MG-MCG tablet, Take 1 tablet by mouth daily., Disp: 28 tablet, Rfl: 12 .  escitalopram (LEXAPRO) 10 MG tablet, Take 1 tablet (10 mg total) by mouth daily., Disp: 90 tablet, Rfl: 3  Review of Systems  Constitutional: Negative for activity change, appetite change, chills, diaphoresis, fatigue, fever and unexpected weight change.  Respiratory: Negative.   Cardiovascular: Negative.   Musculoskeletal: Positive for arthralgias. Negative for gait problem and joint swelling.  Neurological: Negative.     Social History  Substance Use Topics  . Smoking status: Never Smoker  . Smokeless tobacco: Never Used  . Alcohol use Yes     Comment: social   Objective:   BP 104/72 (BP Location: Left Arm, Patient Position: Sitting, Cuff Size: Large)   Pulse 76   Temp 98.3 F (36.8 C) (Oral)   Resp 16   Wt 206 lb (93.4 kg)   LMP 11/09/2016   BMI 34.28 kg/m   Physical Exam  Constitutional: She appears well-developed and well-nourished. No distress.  Neck: Normal range of motion. Neck supple.  Cardiovascular: Normal rate, regular rhythm and normal  heart sounds.  Exam reveals no gallop and no friction rub.   No murmur heard. Pulmonary/Chest: Effort normal and breath sounds normal. No respiratory distress. She has no wheezes. She has no rales.  Musculoskeletal:       Left knee: She exhibits normal range of motion, no swelling, no effusion, no deformity, no erythema, no LCL laxity, normal patellar mobility, no bony tenderness, normal meniscus and no MCL laxity. Tenderness (posterior knee joint) found.  Skin: She is not diaphoretic.  Vitals reviewed.     Assessment & Plan:     1. Acute pain of left knee Patient has failed conservative therapy with nonsteroidal anti-inflammatories and bracing. I will get imaging as below to make sure there is no bony abnormality. Will also give Medrol Dosepak as below to see if she has any improvement with steroid anti-inflammatory. If she fails this treatment as well she is to call the office and I will refer her to orthopedics for further evaluation. - DG Knee Complete 4 Views Left; Future - methylPREDNISolone (MEDROL DOSEPAK) 4 MG TBPK tablet; 6 day taper; take as directed on package instructions  Dispense: 21 tablet; Refill: 0     Patient seen and examined by Margaretann LovelessJennifer M Burnette, PA-C, and note scribed by Allene DillonEmily Drozdowski, CMA.  Margaretann LovelessJennifer M Burnette, PA-C  [facility]

## 2016-11-21 NOTE — Telephone Encounter (Signed)
-----   Message from Margaretann LovelessJennifer M Burnette, [place]PA-C sent at 11/21/2016 11:28 AM EST ----- Knee xray is unremarkable. Will send in steroid taper as we discussed. Call if no improvement in symptoms and will refer to orthopedics.

## 2016-11-21 NOTE — Telephone Encounter (Signed)
lmtcb Chaela Branscum Drozdowski, CMA  

## 2017-04-02 ENCOUNTER — Other Ambulatory Visit: Payer: Self-pay | Admitting: Physician Assistant

## 2017-04-02 DIAGNOSIS — F4322 Adjustment disorder with anxiety: Secondary | ICD-10-CM

## 2017-04-02 MED ORDER — ESCITALOPRAM OXALATE 10 MG PO TABS: 10.0000 mg | ORAL_TABLET | Freq: Every day | ORAL | 3 refills | Status: DC

## 2017-04-02 NOTE — Telephone Encounter (Signed)
[facility] faxed a request on the following medication. Thanks CC  escitalopram (LEXAPRO) 10 MG tablet  Take 1 table by mouth daily.

## 2017-04-30 ENCOUNTER — Encounter: Payer: 59 | Admitting: Physician Assistant

## 2017-05-09 ENCOUNTER — Ambulatory Visit (INDEPENDENT_AMBULATORY_CARE_PROVIDER_SITE_OTHER): Payer: 59 | Admitting: Physician Assistant

## 2017-05-09 ENCOUNTER — Encounter: Payer: Self-pay | Admitting: Physician Assistant

## 2017-05-09 VITALS — BP 120/60 | HR 76 | Temp 98.4°F | Resp 16 | Ht 65.0 in | Wt 210.8 lb

## 2017-05-09 DIAGNOSIS — Z1231 Encounter for screening mammogram for malignant neoplasm of breast: Secondary | ICD-10-CM | POA: Diagnosis not present

## 2017-05-09 DIAGNOSIS — Z136 Encounter for screening for cardiovascular disorders: Secondary | ICD-10-CM

## 2017-05-09 DIAGNOSIS — Z0000 Encounter for general adult medical examination without abnormal findings: Secondary | ICD-10-CM

## 2017-05-09 DIAGNOSIS — Z13220 Encounter for screening for lipoid disorders: Secondary | ICD-10-CM

## 2017-05-09 DIAGNOSIS — Z6835 Body mass index (BMI) 35.0-35.9, adult: Secondary | ICD-10-CM

## 2017-05-09 DIAGNOSIS — Z1239 Encounter for other screening for malignant neoplasm of breast: Secondary | ICD-10-CM

## 2017-05-09 DIAGNOSIS — Z124 Encounter for screening for malignant neoplasm of cervix: Secondary | ICD-10-CM

## 2017-05-09 DIAGNOSIS — Z833 Family history of diabetes mellitus: Secondary | ICD-10-CM | POA: Diagnosis not present

## 2017-05-09 DIAGNOSIS — Z3009 Encounter for other general counseling and advice on contraception: Secondary | ICD-10-CM

## 2017-05-09 DIAGNOSIS — E6609 Other obesity due to excess calories: Secondary | ICD-10-CM | POA: Diagnosis not present

## 2017-05-09 DIAGNOSIS — E669 Obesity, unspecified: Secondary | ICD-10-CM | POA: Insufficient documentation

## 2017-05-09 DIAGNOSIS — Z9049 Acquired absence of other specified parts of digestive tract: Secondary | ICD-10-CM | POA: Insufficient documentation

## 2017-05-09 MED ORDER — DESOGESTREL-ETHINYL ESTRADIOL 0.15-30 MG-MCG PO TABS: 1.0000 | ORAL_TABLET | Freq: Every day | ORAL | 12 refills | Status: DC

## 2017-05-09 NOTE — Patient Instructions (Signed)
Titrating off lexparo: Take 1/2 tab daily x 1 week, then 1/2 tab every other day x 1 week.  Health Maintenance, Female Adopting a healthy lifestyle and getting preventive care can go a long way to promote health and wellness. Talk with your health care provider about what schedule of regular examinations is right for you. This is a good chance for you to check in with your provider about disease prevention and staying healthy. In between checkups, there are plenty of things you can do on your own. Experts have done a lot of research about which lifestyle changes and preventive measures are most likely to keep you healthy. Ask your health care provider for more information. Weight and diet Eat a healthy diet  Be sure to include plenty of vegetables, fruits, low-fat dairy products, and lean protein.  Do not eat a lot of foods high in solid fats, added sugars, or salt.  Get regular exercise. This is one of the most important things you can do for your health.  Most adults should exercise for at least 150 minutes each week. The exercise should increase your heart rate and make you sweat (moderate-intensity exercise).  Most adults should also do strengthening exercises at least twice a week. This is in addition to the moderate-intensity exercise. Maintain a healthy weight  Body mass index (BMI) is a measurement that can be used to identify possible weight problems. It estimates body fat based on height and weight. Your health care provider can help determine your BMI and help you achieve or maintain a healthy weight.  For females 37 years of age and older:  A BMI below 18.5 is considered underweight.  A BMI of 18.5 to 24.9 is normal.  A BMI of 25 to 29.9 is considered overweight.  A BMI of 30 and above is considered obese. Watch levels of cholesterol and blood lipids  You should start having your blood tested for lipids and cholesterol at 36 years of age, then have this test every 5  years.  You may need to have your cholesterol levels checked more often if:  Your lipid or cholesterol levels are high.  You are older than 36 years of age.  You are at high risk for heart disease. Cancer screening Lung Cancer  Lung cancer screening is recommended for adults 42-36 years old who are at high risk for lung cancer because of a history of smoking.  A yearly low-dose CT scan of the lungs is recommended for people who:  Currently smoke.  Have quit within the past 15 years.  Have at least a 30-pack-year history of smoking. A pack year is smoking an average of one pack of cigarettes a day for 1 year.  Yearly screening should continue until it has been 15 years since you quit.  Yearly screening should stop if you develop a health problem that would prevent you from having lung cancer treatment. Breast Cancer  Practice breast self-awareness. This means understanding how your breasts normally appear and feel.  It also means doing regular breast self-exams. Let your health care provider know about any changes, no matter how small.  If you are in your 20s or 30s, you should have a clinical breast exam (CBE) by a health care provider every 1-3 years as part of a regular health exam.  If you are 54 or older, have a CBE every year. Also consider having a breast X-ray (mammogram) every year.  If you have a family history of breast cancer,  talk to your health care provider about genetic screening.  If you are at high risk for breast cancer, talk to your health care provider about having an MRI and a mammogram every year.  Breast cancer gene (BRCA) assessment is recommended for women who have family members with BRCA-related cancers. BRCA-related cancers include:  Breast.  Ovarian.  Tubal.  Peritoneal cancers.  Results of the assessment will determine the need for genetic counseling and BRCA1 and BRCA2 testing. Cervical Cancer  Your health care provider may recommend  that you be screened regularly for cancer of the pelvic organs (ovaries, uterus, and vagina). This screening involves a pelvic examination, including checking for microscopic changes to the surface of your cervix (Pap test). You may be encouraged to have this screening done every 3 years, beginning at age 50.  For women ages 78-65, health care providers may recommend pelvic exams and Pap testing every 3 years, or they may recommend the Pap and pelvic exam, combined with testing for human papilloma virus (HPV), every 5 years. Some types of HPV increase your risk of cervical cancer. Testing for HPV may also be done on women of any age with unclear Pap test results.  Other health care providers may not recommend any screening for nonpregnant women who are considered low risk for pelvic cancer and who do not have symptoms. Ask your health care provider if a screening pelvic exam is right for you.  If you have had past treatment for cervical cancer or a condition that could lead to cancer, you need Pap tests and screening for cancer for at least 20 years after your treatment. If Pap tests have been discontinued, your risk factors (such as having a new sexual partner) need to be reassessed to determine if screening should resume. Some women have medical problems that increase the chance of getting cervical cancer. In these cases, your health care provider may recommend more frequent screening and Pap tests. Colorectal Cancer  This type of cancer can be detected and often prevented.  Routine colorectal cancer screening usually begins at 36 years of age and continues through 36 years of age.  Your health care provider may recommend screening at an earlier age if you have risk factors for colon cancer.  Your health care provider may also recommend using home test kits to check for hidden blood in the stool.  A small camera at the end of a tube can be used to examine your colon directly (sigmoidoscopy or  colonoscopy). This is done to check for the earliest forms of colorectal cancer.  Routine screening usually begins at age 62.  Direct examination of the colon should be repeated every 5-10 years through 36 years of age. However, you may need to be screened more often if early forms of precancerous polyps or small growths are found. Skin Cancer  Check your skin from head to toe regularly.  Tell your health care provider about any new moles or changes in moles, especially if there is a change in a mole's shape or color.  Also tell your health care provider if you have a mole that is larger than the size of a pencil eraser.  Always use sunscreen. Apply sunscreen liberally and repeatedly throughout the day.  Protect yourself by wearing long sleeves, pants, a wide-brimmed hat, and sunglasses whenever you are outside. Heart disease, diabetes, and high blood pressure  High blood pressure causes heart disease and increases the risk of stroke. High blood pressure is more likely to  develop in:  People who have blood pressure in the high end of the normal range (130-139/85-89 mm Hg).  People who are overweight or obese.  People who are African American.  If you are 44-55 years of age, have your blood pressure checked every 3-5 years. If you are 85 years of age or older, have your blood pressure checked every year. You should have your blood pressure measured twice-once when you are at a hospital or clinic, and once when you are not at a hospital or clinic. Record the average of the two measurements. To check your blood pressure when you are not at a hospital or clinic, you can use:  An automated blood pressure machine at a pharmacy.  A home blood pressure monitor.  If you are between 29 years and 29 years old, ask your health care provider if you should take aspirin to prevent strokes.  Have regular diabetes screenings. This involves taking a blood sample to check your fasting blood sugar  level.  If you are at a normal weight and have a low risk for diabetes, have this test once every three years after 36 years of age.  If you are overweight and have a high risk for diabetes, consider being tested at a younger age or more often. Preventing infection Hepatitis B  If you have a higher risk for hepatitis B, you should be screened for this virus. You are considered at high risk for hepatitis B if:  You were born in a country where hepatitis B is common. Ask your health care provider which countries are considered high risk.  Your parents were born in a high-risk country, and you have not been immunized against hepatitis B (hepatitis B vaccine).  You have HIV or AIDS.  You use needles to inject street drugs.  You live with someone who has hepatitis B.  You have had sex with someone who has hepatitis B.  You get hemodialysis treatment.  You take certain medicines for conditions, including cancer, organ transplantation, and autoimmune conditions. Hepatitis C  Blood testing is recommended for:  Everyone born from [birth year] through 1965.  Anyone with known risk factors for hepatitis C. Sexually transmitted infections (STIs)  You should be screened for sexually transmitted infections (STIs) including gonorrhea and chlamydia if:  You are sexually active and are younger than 36 years of age.  You are older than 36 years of age and your health care provider tells you that you are at risk for this type of infection.  Your sexual activity has changed since you were last screened and you are at an increased risk for chlamydia or gonorrhea. Ask your health care provider if you are at risk.  If you do not have HIV, but are at risk, it may be recommended that you take a prescription medicine daily to prevent HIV infection. This is called pre-exposure prophylaxis (PrEP). You are considered at risk if:  You are sexually active and do not regularly use condoms or know the HIV status  of your partner(s).  You take drugs by injection.  You are sexually active with a partner who has HIV. Talk with your health care provider about whether you are at high risk of being infected with HIV. If you choose to begin PrEP, you should first be tested for HIV. You should then be tested every 3 months for as long as you are taking PrEP. Pregnancy  If you are premenopausal and you may become pregnant, ask your health  care provider about preconception counseling.  If you may become pregnant, take 400 to 800 micrograms (mcg) of folic acid every day.  If you want to prevent pregnancy, talk to your health care provider about birth control (contraception). Osteoporosis and menopause  Osteoporosis is a disease in which the bones lose minerals and strength with aging. This can result in serious bone fractures. Your risk for osteoporosis can be identified using a bone density scan.  If you are 81 years of age or older, or if you are at risk for osteoporosis and fractures, ask your health care provider if you should be screened.  Ask your health care provider whether you should take a calcium or vitamin D supplement to lower your risk for osteoporosis.  Menopause may have certain physical symptoms and risks.  Hormone replacement therapy may reduce some of these symptoms and risks. Talk to your health care provider about whether hormone replacement therapy is right for you. Follow these instructions at home:  Schedule regular health, dental, and eye exams.  Stay current with your immunizations.  Do not use any tobacco products including cigarettes, chewing tobacco, or electronic cigarettes.  If you are pregnant, do not drink alcohol.  If you are breastfeeding, limit how much and how often you drink alcohol.  Limit alcohol intake to no more than 1 drink per day for nonpregnant women. One drink equals 12 ounces of beer, 5 ounces of wine, or 1 ounces of hard liquor.  Do not use street  drugs.  Do not share needles.  Ask your health care provider for help if you need support or information about quitting drugs.  Tell your health care provider if you often feel depressed.  Tell your health care provider if you have ever been abused or do not feel safe at home. This information is not intended to replace advice given to you by your health care provider. Make sure you discuss any questions you have with your health care provider. Document Released: 06/12/2011 Document Revised: 05/04/2016 Document Reviewed: 08/31/2015 Elsevier Interactive Patient Education  2017 [employer].

## 2017-05-09 NOTE — Progress Notes (Signed)
Patient: Stacey Santiago, Female    DOB: 19-Jul-1981, 36 y.o.   MRN: [id number] Visit Date: 05/09/2017  Today's Provider: Margaretann Loveless, PA-C   Chief Complaint  Patient presents with  . Annual Exam   Subjective:    Annual physical exam Stacey Santiago is a 36 y.o. female who presents today for health maintenance and complete physical. She feels well. She reports exercising. She reports she is sleeping well.No family hx of breast cancer. She reports that her eye's are red with discharge in the mornings. Reports that her daughter has been sick and has eye redness.  Last CPE:04/27/16 Pap:06/06/13-Negative ----------------------------------------------------------------- Rash: pt went to [facility] like a month ago. She reports rash looks better since but still there and wants provider to take a look.  Review of Systems  Constitutional: Negative.   HENT: Negative.   Eyes: Positive for discharge and redness.  Respiratory: Negative.   Cardiovascular: Negative.   Gastrointestinal: Negative.   Endocrine: Negative.   Genitourinary: Negative.   Musculoskeletal: Negative.   Skin: Positive for rash.  Allergic/Immunologic: Negative.   Neurological: Negative.   Hematological: Negative.   Psychiatric/Behavioral: Negative.     Social History      She  reports that she has never smoked. She has never used smokeless tobacco. She reports that she drinks alcohol. She reports that she does not use drugs.       Social History   Social History  . Marital status: Married    Spouse name: Ramon Dredge  . Number of children: 2  . Years of education: N/A   Social History Main Topics  . Smoking status: Never Smoker  . Smokeless tobacco: Never Used  . Alcohol use Yes     Comment: social  . Drug use: No  . Sexual activity: Yes    Partners: Male   Other Topics Concern  . None   Social History Narrative  . None    Past Medical History:  Diagnosis Date  . Anxiety   .  Depression   . GERD (gastroesophageal reflux disease)      Patient Active Problem List   Diagnosis Date Noted  . Appendicitis 06/09/2016  . Appendicitis, acute   . Adjustment reaction 05/24/2015  . Adaptation reaction 04/26/2015  . Family planning 04/26/2015  . Pregnancy 02/20/2014  . Supervision of normal pregnancy 07/08/2013  . Chromophytosis 01/31/2007  . Acid reflux 01/30/2007    Past Surgical History:  Procedure Laterality Date  . LAPAROSCOPIC APPENDECTOMY N/A 06/09/2016   Procedure: APPENDECTOMY LAPAROSCOPIC;  Surgeon: Ricarda Frame, MD;  Location: [facility] ORS;  Service: General;  Laterality: N/A;  . WISDOM TOOTH EXTRACTION  19 yrs. of age    Family History        Family Status  Relation Status  . Mother Alive  . Father Alive  . Brother Alive  . MGF (Not Specified)        Her family history includes Diabetes in her maternal grandfather and mother; Hyperlipidemia in her father and mother; Hypertension in her father and mother.     No Known Allergies   Current Outpatient Prescriptions:  .  desogestrel-ethinyl estradiol (APRI) 0.15-30 MG-MCG tablet, Take 1 tablet by mouth daily., Disp: 28 tablet, Rfl: 12 .  escitalopram (LEXAPRO) 10 MG tablet, Take 1 tablet (10 mg total) by mouth daily., Disp: 90 tablet, Rfl: 3 .  ketoconazole (NIZORAL) 2 % shampoo, , Disp: , Rfl:  .  triamcinolone cream (KENALOG)  0.1 %, APPLY TOPICALLY 2 TIMES A DAY FOR 14 DAYS. DO NOT USE ON FACE., Disp: , Rfl: 0 .  methylPREDNISolone (MEDROL DOSEPAK) 4 MG TBPK tablet, 6 day taper; take as directed on package instructions (Patient not taking: Reported on 05/09/2017), Disp: 21 tablet, Rfl: 0   Patient Care Team: Margaretann LovelessBurnette, Razan Siler M, PA-C as PCP - General (Family Medicine)      Objective:   Vitals: BP 120/60 (BP Location: Right Arm, Patient Position: Sitting, Cuff Size: Normal)   Pulse 76   Temp 98.4 F (36.9 C) (Oral)   Resp 16   Ht 5\' 5"  (1.651 m)   Wt 210 lb 12.8 oz (95.6 kg)   LMP  04/25/2017   BMI 35.08 kg/m     Physical Exam  Constitutional: She is oriented to person, place, and time. She appears well-developed and well-nourished. No distress.  HENT:  Head: Normocephalic and atraumatic.  Right Ear: Hearing, tympanic membrane, external ear and ear canal normal.  Left Ear: Hearing, tympanic membrane, external ear and ear canal normal.  Nose: Nose normal.  Mouth/Throat: Uvula is midline, oropharynx is clear and moist and mucous membranes are normal. No oropharyngeal exudate.  Eyes: EOM are normal. Pupils are equal, round, and reactive to light. Right eye exhibits no discharge. Left eye exhibits no discharge. Right conjunctiva is not injected. Left conjunctiva is injected. No scleral icterus.  Neck: Normal range of motion. Neck supple. No JVD present. Carotid bruit is not present. No tracheal deviation present. No thyromegaly present.  Cardiovascular: Normal rate, regular rhythm, normal heart sounds and intact distal pulses.  Exam reveals no gallop and no friction rub.   No murmur heard. Pulmonary/Chest: Effort normal and breath sounds normal. No respiratory distress. She has no wheezes. She has no rales. She exhibits no tenderness. Right breast exhibits no inverted nipple, no mass, no nipple discharge, no skin change and no tenderness. Left breast exhibits no inverted nipple, no mass, no nipple discharge, no skin change and no tenderness. Breasts are symmetrical.  Abdominal: Soft. Bowel sounds are normal. She exhibits no distension and no mass. There is no tenderness. There is no rebound and no guarding. Hernia confirmed negative in the right inguinal area and confirmed negative in the left inguinal area.  Genitourinary: Rectum normal, vagina normal and uterus normal. No breast swelling, tenderness, discharge or bleeding. Pelvic exam was performed with patient supine. There is no rash, tenderness, lesion or injury on the right labia. There is no rash, tenderness, lesion or  injury on the left labia. Cervix exhibits no motion tenderness, no discharge and no friability. Right adnexum displays no mass, no tenderness and no fullness. Left adnexum displays no mass, no tenderness and no fullness. No erythema, tenderness or bleeding in the vagina. No signs of injury around the vagina. No vaginal discharge found.  Musculoskeletal: Normal range of motion. She exhibits no edema or tenderness.  Lymphadenopathy:    She has no cervical adenopathy.       Right: No inguinal adenopathy present.       Left: No inguinal adenopathy present.  Neurological: She is alert and oriented to person, place, and time. She has normal reflexes. No cranial nerve deficit. Coordination normal.  Skin: Skin is warm and dry. Rash (dried and healing) noted. She is not diaphoretic.  Psychiatric: She has a normal mood and affect. Her behavior is normal. Judgment and thought content normal.  Vitals reviewed.    Depression Screen PHQ 2/9 Scores 05/09/2017 04/27/2016  PHQ -  2 Score 0 0  PHQ- 9 Score 0 -      Assessment & Plan:     Routine Health Maintenance and Physical Exam  Exercise Activities and Dietary recommendations Goals    None      Immunization History  Administered Date(s) Administered  . DTaP 12/11/1998  . Influenza Whole 10/11/2012  . Influenza,inj,Quad PF,36+ Mos 10/01/2013, 10/04/2016  . Td 03/27/2012  . Tdap 12/25/2013    Health Maintenance  Topic Date Due  . PAP SMEAR  06/06/2016  . INFLUENZA VACCINE  07/11/2017  . TETANUS/TDAP  12/26/2023  . HIV Screening  Completed     Discussed health benefits of physical activity, and encouraged her to engage in regular exercise appropriate for her age and condition.    1. Annual physical exam Normal physical exam today. Will check labs as below and f/u pending lab results. If labs are stable and WNL she will not need to have these rechecked for one year at her next annual physical exam. She is to call the office in the  meantime if she has any acute issue, questions or concerns. - CBC with Differential/Platelet - Comprehensive metabolic panel - TSH  2. Breast cancer screening Breast exam normal today. No breast cancer history in family. She does perform self breast exams.   3. Cervical cancer screening Pap collected today. Will send as below and f/u pending results. - Pap IG, CT/NG NAA, and HPV (high risk)  4. Class 2 obesity due to excess calories without serious comorbidity with body mass index (BMI) of 35.0 to 35.9 in adult Counseled patient on healthy lifestyle modifications including dieting and exercise.   5. Encounter for lipid screening for cardiovascular disease Will check labs as below and f/u pending results. - Lipid panel  6. Family history of diabetes mellitus (DM) Will check labs as below and f/u pending results. - Hemoglobin A1c  7. Family planning Stable. Diagnosis pulled for medication refill. Continue current medical treatment plan. - desogestrel-ethinyl estradiol (APRI) 0.15-30 MG-MCG tablet; Take 1 tablet by mouth daily.  Dispense: 28 tablet; Refill: 12  --------------------------------------------------------------------    Margaretann Loveless, PA-C  [facility]

## 2017-05-15 LAB — PAP IG, CT-NG NAA, HPV HIGH-RISK
CHLAMYDIA, NUC. ACID AMP: NEGATIVE
Gonococcus by Nucleic Acid Amp: NEGATIVE
HPV, high-risk: NEGATIVE
PAP Smear Comment: 0

## 2017-05-17 ENCOUNTER — Telehealth: Payer: Self-pay

## 2017-05-17 LAB — CBC WITH DIFFERENTIAL/PLATELET
BASOS ABS: 0 10*3/uL (ref 0.0–0.2)
Basos: 0 %
EOS (ABSOLUTE): 0.1 10*3/uL (ref 0.0–0.4)
Eos: 2 %
HEMOGLOBIN: 12.5 g/dL (ref 11.1–15.9)
Hematocrit: 37.6 % (ref 34.0–46.6)
IMMATURE GRANS (ABS): 0 10*3/uL (ref 0.0–0.1)
Immature Granulocytes: 0 %
LYMPHS: 24 %
Lymphocytes Absolute: 1.1 10*3/uL (ref 0.7–3.1)
MCH: 30 pg (ref 26.6–33.0)
MCHC: 33.2 g/dL (ref 31.5–35.7)
MCV: 90 fL (ref 79–97)
MONOCYTES: 8 %
Monocytes Absolute: 0.4 10*3/uL (ref 0.1–0.9)
NEUTROS ABS: 3 10*3/uL (ref 1.4–7.0)
NEUTROS PCT: 66 %
PLATELETS: 284 10*3/uL (ref 150–379)
RBC: 4.16 x10E6/uL (ref 3.77–5.28)
RDW: 14 % (ref 12.3–15.4)
WBC: 4.7 10*3/uL (ref 3.4–10.8)

## 2017-05-17 LAB — COMPREHENSIVE METABOLIC PANEL
ALBUMIN: 4.1 g/dL (ref 3.5–5.5)
ALT: 17 IU/L (ref 0–32)
AST: 19 IU/L (ref 0–40)
Albumin/Globulin Ratio: 1.9 (ref 1.2–2.2)
Alkaline Phosphatase: 84 IU/L (ref 39–117)
BILIRUBIN TOTAL: 0.2 mg/dL (ref 0.0–1.2)
BUN / CREAT RATIO: 9 (ref 9–23)
BUN: 10 mg/dL (ref 6–20)
CHLORIDE: 105 mmol/L (ref 96–106)
CO2: 22 mmol/L (ref 18–29)
CREATININE: 1.06 mg/dL — AB (ref 0.57–1.00)
Calcium: 9.2 mg/dL (ref 8.7–10.2)
GFR calc non Af Amer: 68 mL/min/{1.73_m2} (ref >59)
GFR, EST AFRICAN AMERICAN: 78 mL/min/{1.73_m2} (ref >59)
GLUCOSE: 89 mg/dL (ref 65–99)
Globulin, Total: 2.2 g/dL (ref 1.5–4.5)
Potassium: 4.4 mmol/L (ref 3.5–5.2)
Sodium: 140 mmol/L (ref 134–144)
TOTAL PROTEIN: 6.3 g/dL (ref 6.0–8.5)

## 2017-05-17 LAB — LIPID PANEL
CHOL/HDL RATIO: 2.7 ratio (ref 0.0–4.4)
CHOLESTEROL TOTAL: 166 mg/dL (ref 100–199)
HDL: 61 mg/dL (ref >39)
LDL CALC: 91 mg/dL (ref 0–99)
TRIGLYCERIDES: 71 mg/dL (ref 0–149)
VLDL Cholesterol Cal: 14 mg/dL (ref 5–40)

## 2017-05-17 LAB — TSH: TSH: 2.09 u[IU]/mL (ref 0.450–4.500)

## 2017-05-17 LAB — HEMOGLOBIN A1C
Est. average glucose Bld gHb Est-mCnc: 100 mg/dL
Hgb A1c MFr Bld: 5.1 % (ref 4.8–5.6)

## 2017-05-17 NOTE — Telephone Encounter (Signed)
lmtcb Emily Drozdowski, CMA  

## 2017-05-17 NOTE — Telephone Encounter (Signed)
Advised pt of lab results. Pt verbally acknowledges understanding. Zohar Laing Drozdowski, CMA   

## 2017-05-17 NOTE — Telephone Encounter (Signed)
-----   Message from Margaretann LovelessJennifer M Burnette, PA-C sent at 05/17/2017  8:31 AM EDT ----- Labs are stable. Creatinine is slightly elevated. May be dehydration from fasting for labs. Make sure to push fluids well. If taking any supplements check for creatinine as this can also falsely elevate this lab, as well as high intensity work outs or heavy lifting (any increase in muscle mass). Would recommend to check in 4 weeks to make sure returning to baseline after pushing fluids and hydrating or stopping any supplement that may have creatinine.

## 2017-09-05 ENCOUNTER — Telehealth: Payer: Self-pay | Admitting: Physician Assistant

## 2017-09-10 ENCOUNTER — Encounter: Payer: Self-pay | Admitting: Physician Assistant

## 2017-09-10 ENCOUNTER — Ambulatory Visit (INDEPENDENT_AMBULATORY_CARE_PROVIDER_SITE_OTHER): Payer: 59 | Admitting: Physician Assistant

## 2017-09-10 VITALS — BP 108/60 | HR 87 | Temp 98.7°F | Resp 16 | Wt 211.6 lb

## 2017-09-10 DIAGNOSIS — Z6835 Body mass index (BMI) 35.0-35.9, adult: Secondary | ICD-10-CM | POA: Diagnosis not present

## 2017-09-10 DIAGNOSIS — Z23 Encounter for immunization: Secondary | ICD-10-CM

## 2017-09-10 DIAGNOSIS — J069 Acute upper respiratory infection, unspecified: Secondary | ICD-10-CM

## 2017-09-10 DIAGNOSIS — B9789 Other viral agents as the cause of diseases classified elsewhere: Secondary | ICD-10-CM | POA: Diagnosis not present

## 2017-09-10 NOTE — Progress Notes (Signed)
Patient: Stacey Santiago Female    DOB: [birth year]   36 y.o.   MRN: [id number] Visit Date: 09/10/2017  Today's Provider: Margaretann Loveless, PA-C   Chief Complaint  Patient presents with  . Appeal Form for work  . Cough   Subjective:    HPI Patient comes in today needs appeal form to be completed.  Cough: Patient complains of nonproductive cough, wheezing and chest congestion.  Symptoms began this weekend.  The cough is non-productive, without wheezing, dyspnea or hemoptysis and is aggravated by nothing Associated symptoms include:shortness of breath and wheezing.       No Known Allergies   Current Outpatient Prescriptions:  .  desogestrel-ethinyl estradiol (APRI) 0.15-30 MG-MCG tablet, Take 1 tablet by mouth daily., Disp: 28 tablet, Rfl: 12 .  escitalopram (LEXAPRO) 10 MG tablet, , Disp: , Rfl:  .  ketoconazole (NIZORAL) 2 % shampoo, , Disp: , Rfl:  .  triamcinolone cream (KENALOG) 0.1 %, APPLY TOPICALLY 2 TIMES A DAY FOR 14 DAYS. DO NOT USE ON FACE., Disp: , Rfl: 0  Review of Systems  Constitutional: Negative for fever.  HENT: Positive for congestion (in her chest). Negative for postnasal drip, rhinorrhea, sinus pain, sinus pressure, sneezing and sore throat.   Respiratory: Positive for cough, shortness of breath (sometimes with cough) and wheezing (a little). Negative for chest tightness.   Cardiovascular: Negative for chest pain, palpitations and leg swelling.    Social History  Substance Use Topics  . Smoking status: Never Smoker  . Smokeless tobacco: Never Used  . Alcohol use Yes     Comment: social   Objective:   BP 108/60 (BP Location: Right Arm, Patient Position: Sitting, Cuff Size: Large)   Pulse 87   Temp 98.7 F (37.1 C) (Oral)   Resp 16   Wt 211 lb 9.6 oz (96 kg)   SpO2 98%   BMI 35.21 kg/m    Physical Exam  Constitutional: She appears well-developed and well-nourished. No distress.  HENT:  Head: Normocephalic and atraumatic.  Right Ear:  Hearing, tympanic membrane, external ear and ear canal normal.  Left Ear: Hearing, tympanic membrane, external ear and ear canal normal.  Nose: Nose normal.  Mouth/Throat: Uvula is midline, oropharynx is clear and moist and mucous membranes are normal. No oropharyngeal exudate.  Eyes: Pupils are equal, round, and reactive to light. Conjunctivae are normal. Right eye exhibits no discharge. Left eye exhibits no discharge. No scleral icterus.  Neck: Normal range of motion. Neck supple. No tracheal deviation present. No thyromegaly present.  Cardiovascular: Normal rate, regular rhythm and normal heart sounds.  Exam reveals no gallop and no friction rub.   No murmur heard. Pulmonary/Chest: Effort normal and breath sounds normal. No stridor. No respiratory distress. She has no wheezes. She has no rales.  Lymphadenopathy:    She has no cervical adenopathy.  Skin: Skin is warm and dry. She is not diaphoretic.  Vitals reviewed.       Assessment & Plan:     1. Viral URI with cough Discussed OTC symptom treatment. May use OTC medication of choice. Discussed signs of second sickening and return precautions.   2. BMI 35.0-35.9,adult Discussed weight loss and exercise. She has cut out sodas, drinking more water. Riding bike with her family and exercising during lunch. BMI appeal form completed and given to patient.   3. Need for influenza vaccination Flu vaccine given today without complication. Patient sat upright for 15 minutes to check  for adverse reaction before being released. - Flu Vaccine QUAD 36+ mos IM      Margaretann Loveless, PA-C  [facility]

## 2017-09-10 NOTE — Patient Instructions (Signed)
Exercising to Lose Weight Exercising can help you to lose weight. In order to lose weight through exercise, you need to do vigorous-intensity exercise. You can tell that you are exercising with vigorous intensity if you are breathing very hard and fast and cannot hold a conversation while exercising. Moderate-intensity exercise helps to maintain your current weight. You can tell that you are exercising at a moderate level if you have a higher heart rate and faster breathing, but you are still able to hold a conversation. How often should I exercise? Choose an activity that you enjoy and set realistic goals. Your health care provider can help you to make an activity plan that works for you. Exercise regularly as directed by your health care provider. This may include:  Doing resistance training twice each week, such as: ? Push-ups. ? Sit-ups. ? Lifting weights. ? Using resistance bands.  Doing a given intensity of exercise for a given amount of time. Choose from these options: ? 150 minutes of moderate-intensity exercise every week. ? 75 minutes of vigorous-intensity exercise every week. ? A mix of moderate-intensity and vigorous-intensity exercise every week.  Children, pregnant women, people who are out of shape, people who are overweight, and older adults may need to consult a health care provider for individual recommendations. If you have any sort of medical condition, be sure to consult your health care provider before starting a new exercise program. What are some activities that can help me to lose weight?  Walking at a rate of at least 4.5 miles an hour.  Jogging or running at a rate of 5 miles per hour.  Biking at a rate of at least 10 miles per hour.  Lap swimming.  Roller-skating or in-line skating.  Cross-country skiing.  Vigorous competitive sports, such as football, basketball, and soccer.  Jumping rope.  Aerobic dancing. How can I be more active in my day-to-day  activities?  Use the stairs instead of the elevator.  Take a walk during your lunch break.  If you drive, park your car farther away from work or school.  If you take public transportation, get off one stop early and walk the rest of the way.  Make all of your phone calls while standing up and walking around.  Get up, stretch, and walk around every 30 minutes throughout the day. What guidelines should I follow while exercising?  Do not exercise so much that you hurt yourself, feel dizzy, or get very short of breath.  Consult your health care provider prior to starting a new exercise program.  Wear comfortable clothes and shoes with good support.  Drink plenty of water while you exercise to prevent dehydration or heat stroke. Body water is lost during exercise and must be replaced.  Work out until you breathe faster and your heart beats faster. This information is not intended to replace advice given to you by your health care provider. Make sure you discuss any questions you have with your health care provider. Document Released: 12/30/2010 Document Revised: 05/04/2016 Document Reviewed: 04/30/2014 Elsevier Interactive Patient Education  2018 [employer] Inc[employer]  

## 2017-11-29 NOTE — Telephone Encounter (Signed)
error/MW °

## 2018-01-28 ENCOUNTER — Other Ambulatory Visit: Payer: Self-pay | Admitting: Physician Assistant

## 2018-01-28 DIAGNOSIS — Z3009 Encounter for other general counseling and advice on contraception: Secondary | ICD-10-CM

## 2018-01-28 MED ORDER — DESOGESTREL-ETHINYL ESTRADIOL 0.15-30 MG-MCG PO TABS: 1.0000 | ORAL_TABLET | Freq: Every day | ORAL | 4 refills | Status: DC

## 2018-01-28 NOTE — Telephone Encounter (Signed)
[facility] faxed a refill request for the following medication. Thanks CC ° °desogestrel-ethinyl estradiol (APRI) 0.15-30 MG-MCG tablet  ° °

## 2018-01-30 ENCOUNTER — Telehealth: Payer: Self-pay | Admitting: Physician Assistant

## 2018-01-30 DIAGNOSIS — Z3009 Encounter for other general counseling and advice on contraception: Secondary | ICD-10-CM

## 2018-01-30 MED ORDER — DESOGESTREL-ETHINYL ESTRADIOL 0.15-30 MG-MCG PO TABS: 1.0000 | ORAL_TABLET | Freq: Every day | ORAL | 4 refills | Status: DC

## 2018-01-30 NOTE — Telephone Encounter (Signed)
[facility]

## 2018-01-30 NOTE — Telephone Encounter (Signed)
[facility] faxed a refill request for the following medication. Thanks CC  desogestrel-ethinyl estradiol (APRI) 0.15-30 MG-MCG tablet

## 2018-01-30 NOTE — Telephone Encounter (Signed)
We sent a refill to [facility] instead of [facility]. Sorry.

## 2018-02-24 ENCOUNTER — Other Ambulatory Visit: Payer: Self-pay | Admitting: Physician Assistant

## 2018-02-24 DIAGNOSIS — F4322 Adjustment disorder with anxiety: Secondary | ICD-10-CM

## 2018-09-12 ENCOUNTER — Ambulatory Visit (INDEPENDENT_AMBULATORY_CARE_PROVIDER_SITE_OTHER): Payer: Managed Care, Other (non HMO) | Admitting: Physician Assistant

## 2018-09-12 ENCOUNTER — Encounter: Payer: Self-pay | Admitting: Physician Assistant

## 2018-09-12 VITALS — BP 104/80 | HR 78 | Temp 98.2°F | Resp 16 | Ht 65.0 in | Wt 220.8 lb

## 2018-09-12 DIAGNOSIS — E6609 Other obesity due to excess calories: Secondary | ICD-10-CM

## 2018-09-12 DIAGNOSIS — Z0000 Encounter for general adult medical examination without abnormal findings: Secondary | ICD-10-CM

## 2018-09-12 DIAGNOSIS — Z6836 Body mass index (BMI) 36.0-36.9, adult: Secondary | ICD-10-CM | POA: Diagnosis not present

## 2018-09-12 DIAGNOSIS — Z23 Encounter for immunization: Secondary | ICD-10-CM | POA: Diagnosis not present

## 2018-09-12 NOTE — Progress Notes (Signed)
Patient: Stacey Santiago, Female    DOB: Mar 14, 1981, 37 y.o.   MRN: [id number] Visit Date: 09/12/2018  Today's Provider: Margaretann Loveless, PA-C   Chief Complaint  Patient presents with  . Annual Exam   Subjective:    Annual physical exam Stacey Santiago is a 37 y.o. female who presents today for health maintenance and complete physical. She feels well. She reports exercising, some walking. She reports she is sleeping well.  Last CPE:05/09/17 Pap:05/09/17 Normal,HPV-negative -----------------------------------------------------------------   Review of Systems  Constitutional: Negative.   HENT: Negative.   Eyes: Negative.   Respiratory: Negative.   Cardiovascular: Negative.   Gastrointestinal: Negative.   Endocrine: Negative.   Genitourinary: Negative.   Musculoskeletal: Negative.   Skin: Negative.   Allergic/Immunologic: Negative.   Neurological: Negative.   Hematological: Negative.   Psychiatric/Behavioral: Negative.     Social History      She  reports that she has never smoked. She has never used smokeless tobacco. She reports that she drinks alcohol. She reports that she does not use drugs.       Social History   Socioeconomic History  . Marital status: Married    Spouse name: Ramon Dredge  . Number of children: 2  . Years of education: Not on file  . Highest education level: Not on file  Occupational History  . Not on file  Social Needs  . Financial resource strain: Not on file  . Food insecurity:    Worry: Not on file    Inability: Not on file  . Transportation needs:    Medical: Not on file    Non-medical: Not on file  Tobacco Use  . Smoking status: Never Smoker  . Smokeless tobacco: Never Used  Substance and Sexual Activity  . Alcohol use: Yes    Comment: social  . Drug use: No  . Sexual activity: Yes    Partners: Male  Lifestyle  . Physical activity:    Days per week: Not on file    Minutes per session: Not on file  . Stress: Not on file    Relationships  . Social connections:    Talks on phone: Not on file    Gets together: Not on file    Attends religious service: Not on file    Active member of club or organization: Not on file    Attends meetings of clubs or organizations: Not on file    Relationship status: Not on file  Other Topics Concern  . Not on file  Social History Narrative  . Not on file    Past Medical History:  Diagnosis Date  . Anxiety   . Depression   . GERD (gastroesophageal reflux disease)      Patient Active Problem List   Diagnosis Date Noted  . S/P appendectomy 05/09/2017  . Class 2 obesity due to excess calories without serious comorbidity with body mass index (BMI) of 35.0 to 35.9 in adult 05/09/2017  . Adjustment reaction 05/24/2015  . Chromophytosis 01/31/2007  . Acid reflux 01/30/2007    Past Surgical History:  Procedure Laterality Date  . LAPAROSCOPIC APPENDECTOMY N/A 06/09/2016   Procedure: APPENDECTOMY LAPAROSCOPIC;  Surgeon: Ricarda Frame, MD;  Location: [facility] ORS;  Service: General;  Laterality: N/A;  . WISDOM TOOTH EXTRACTION  19 yrs. of age    Family History        Family Status  Relation Name Status  . Mother  Alive  . Father  Alive  .  Brother  Alive  . MGF  (Not Specified)        Her family history includes Diabetes in her maternal grandfather and mother; Hyperlipidemia in her father and mother; Hypertension in her father and mother.      No Known Allergies   Current Outpatient Medications:  .  desogestrel-ethinyl estradiol (APRI) 0.15-30 MG-MCG tablet, Take 1 tablet by mouth daily., Disp: 84 tablet, Rfl: 4 .  escitalopram (LEXAPRO) 10 MG tablet, TAKE 1 TABLET BY MOUTH  DAILY, Disp: 90 tablet, Rfl: 3 .  ketoconazole (NIZORAL) 2 % shampoo, , Disp: , Rfl:  .  triamcinolone cream (KENALOG) 0.1 %, APPLY TOPICALLY 2 TIMES A DAY FOR 14 DAYS. DO NOT USE ON FACE., Disp: , Rfl: 0   Patient Care Team: Margaretann Loveless, PA-C as PCP - General (Family Medicine)       Objective:   Vitals: BP 104/80 (BP Location: Left Arm, Patient Position: Sitting, Cuff Size: Large)   Pulse 78   Temp 98.2 F (36.8 C) (Oral)   Resp 16   Ht 5\' 5"  (1.651 m)   Wt 220 lb 12.8 oz (100.2 kg)   LMP 09/12/2018   BMI 36.74 kg/m    Vitals:   09/12/18 1015  BP: 104/80  Pulse: 78  Resp: 16  Temp: 98.2 F (36.8 C)  TempSrc: Oral  Weight: 220 lb 12.8 oz (100.2 kg)  Height: 5\' 5"  (1.651 m)     Physical Exam  Constitutional: She is oriented to person, place, and time. She appears well-developed and well-nourished.  HENT:  Head: Normocephalic.  Right Ear: External ear normal.  Left Ear: External ear normal.  Nose: Nose normal.  Mouth/Throat: Oropharynx is clear and moist.  Eyes: Pupils are equal, round, and reactive to light. Conjunctivae and EOM are normal.  Neck: Normal range of motion. Neck supple.  Cardiovascular: Normal rate, regular rhythm, normal heart sounds and intact distal pulses.  Pulmonary/Chest: Effort normal and breath sounds normal.  Abdominal: Soft. Bowel sounds are normal.  Musculoskeletal: Normal range of motion.  Neurological: She is alert and oriented to person, place, and time.  Skin: Skin is warm.  Psychiatric: She has a normal mood and affect. Her behavior is normal. Judgment and thought content normal.     Depression Screen PHQ 2/9 Scores 05/09/2017 04/27/2016  PHQ - 2 Score 0 0  PHQ- 9 Score 0 -      Assessment & Plan:     Routine Health Maintenance and Physical Exam  Exercise Activities and Dietary recommendations Goals   None     Immunization History  Administered Date(s) Administered  . DTaP 12/11/1998  . Influenza Whole 10/11/2012  . Influenza,inj,Quad PF,6+ Mos 10/01/2013, 10/04/2016, 09/10/2017  . Td 03/27/2012  . Tdap 12/25/2013    Health Maintenance  Topic Date Due  . INFLUENZA VACCINE  07/11/2018  . PAP SMEAR  05/09/2020  . TETANUS/TDAP  12/26/2023  . HIV Screening  Completed     Discussed health  benefits of physical activity, and encouraged her to engage in regular exercise appropriate for her age and condition.    1. Annual physical exam Normal physical exam today. Will check labs as below and f/u pending lab results. If labs are stable and WNL she will not need to have these rechecked for one year at her next annual physical exam. She is to call the office in the meantime if she has any acute issue, questions or concerns. - CBC with Differential/Platelet - Comprehensive metabolic  panel - TSH  2. Class 2 obesity due to excess calories without serious comorbidity with body mass index (BMI) of 36.0 to 36.9 in adult Counseled patient on healthy lifestyle modifications including dieting and exercise.  - CBC with Differential/Platelet - Comprehensive metabolic panel - TSH  3. BMI 36.0-36.9,adult See above medical treatment plan.  4. Need for influenza vaccination Flu vaccine given today without complication. Patient sat upright for 15 minutes to check for adverse reaction before being released. - Flu Vaccine QUAD 36+ mos IM  --------------------------------------------------------------------    Margaretann Loveless, PA-C  [facility]

## 2018-09-12 NOTE — Patient Instructions (Signed)

## 2018-09-13 ENCOUNTER — Telehealth: Payer: Self-pay

## 2018-09-13 LAB — CBC WITH DIFFERENTIAL/PLATELET
BASOS ABS: 0 10*3/uL (ref 0.0–0.2)
BASOS: 1 %
EOS (ABSOLUTE): 0.2 10*3/uL (ref 0.0–0.4)
EOS: 2 %
HEMATOCRIT: 37.5 % (ref 34.0–46.6)
HEMOGLOBIN: 12.3 g/dL (ref 11.1–15.9)
Immature Grans (Abs): 0 10*3/uL (ref 0.0–0.1)
Immature Granulocytes: 0 %
Lymphocytes Absolute: 2.1 10*3/uL (ref 0.7–3.1)
Lymphs: 30 %
MCH: 29.1 pg (ref 26.6–33.0)
MCHC: 32.8 g/dL (ref 31.5–35.7)
MCV: 89 fL (ref 79–97)
MONOCYTES: 5 %
Monocytes Absolute: 0.4 10*3/uL (ref 0.1–0.9)
NEUTROS ABS: 4.3 10*3/uL (ref 1.4–7.0)
Neutrophils: 62 %
Platelets: 357 10*3/uL (ref 150–450)
RBC: 4.23 x10E6/uL (ref 3.77–5.28)
RDW: 14.1 % (ref 12.3–15.4)
WBC: 6.9 10*3/uL (ref 3.4–10.8)

## 2018-09-13 LAB — COMPREHENSIVE METABOLIC PANEL
A/G RATIO: 1.8 (ref 1.2–2.2)
ALBUMIN: 4.4 g/dL (ref 3.5–5.5)
ALK PHOS: 90 IU/L (ref 39–117)
ALT: 15 IU/L (ref 0–32)
AST: 15 IU/L (ref 0–40)
BUN / CREAT RATIO: 9 (ref 9–23)
BUN: 8 mg/dL (ref 6–20)
CHLORIDE: 102 mmol/L (ref 96–106)
CO2: 20 mmol/L (ref 20–29)
Calcium: 9.7 mg/dL (ref 8.7–10.2)
Creatinine, Ser: 0.87 mg/dL (ref 0.57–1.00)
GFR calc non Af Amer: 85 mL/min/{1.73_m2} (ref >59)
GFR, EST AFRICAN AMERICAN: 98 mL/min/{1.73_m2} (ref >59)
GLOBULIN, TOTAL: 2.4 g/dL (ref 1.5–4.5)
Glucose: 76 mg/dL (ref 65–99)
Potassium: 4.5 mmol/L (ref 3.5–5.2)
SODIUM: 139 mmol/L (ref 134–144)
TOTAL PROTEIN: 6.8 g/dL (ref 6.0–8.5)

## 2018-09-13 LAB — TSH: TSH: 1.72 u[IU]/mL (ref 0.450–4.500)

## 2018-09-13 NOTE — Telephone Encounter (Signed)
Pt returned call ° °teri °

## 2018-09-13 NOTE — Telephone Encounter (Signed)
-----   Message from Margaretann Loveless, [place] sent at 09/13/2018 12:26 PM EDT ----- All labs are within normal limits and stable.  Thanks! -JB

## 2018-09-13 NOTE — Telephone Encounter (Signed)
Patient advised.

## 2018-09-13 NOTE — Telephone Encounter (Signed)
LMTCB

## 2018-12-26 ENCOUNTER — Other Ambulatory Visit: Payer: Self-pay | Admitting: Physician Assistant

## 2018-12-26 DIAGNOSIS — F4322 Adjustment disorder with anxiety: Secondary | ICD-10-CM

## 2019-01-17 ENCOUNTER — Other Ambulatory Visit: Payer: Self-pay | Admitting: Physician Assistant

## 2019-01-17 DIAGNOSIS — Z3009 Encounter for other general counseling and advice on contraception: Secondary | ICD-10-CM

## 2019-09-12 NOTE — Progress Notes (Deleted)
Patient: Stacey Santiago, Female    DOB: Nov 05, 1981, 38 y.o.   MRN: [id number] Visit Date: 09/12/2019  Today's Provider: Margaretann Loveless, PA-C   No chief complaint on file.  Subjective:     Annual physical exam Stacey Santiago is a 38 y.o. female who presents today for health maintenance and complete physical. She feels {DESC; WELL/FAIRLY WELL/POORLY:18703}. She reports exercising ***. She reports she is sleeping {DESC; WELL/FAIRLY WELL/POORLY:18703}.  ----------------------------------------------------------------- 05/15/2017 -Pap is normal, HPV negative. Will repeat in 3 -5 years.  Review of Systems  Social History      She  reports that she has never smoked. She has never used smokeless tobacco. She reports current alcohol use. She reports that she does not use drugs.       Social History   Socioeconomic History  . Marital status: Married    Spouse name: Ramon Dredge  . Number of children: 2  . Years of education: Not on file  . Highest education level: Not on file  Occupational History  . Not on file  Social Needs  . Financial resource strain: Not on file  . Food insecurity    Worry: Not on file    Inability: Not on file  . Transportation needs    Medical: Not on file    Non-medical: Not on file  Tobacco Use  . Smoking status: Never Smoker  . Smokeless tobacco: Never Used  Substance and Sexual Activity  . Alcohol use: Yes    Comment: social  . Drug use: No  . Sexual activity: Yes    Partners: Male  Lifestyle  . Physical activity    Days per week: Not on file    Minutes per session: Not on file  . Stress: Not on file  Relationships  . Social Musician on phone: Not on file    Gets together: Not on file    Attends religious service: Not on file    Active member of club or organization: Not on file    Attends meetings of clubs or organizations: Not on file    Relationship status: Not on file  Other Topics Concern  . Not on file  Social History  Narrative  . Not on file    Past Medical History:  Diagnosis Date  . Anxiety   . Depression   . GERD (gastroesophageal reflux disease)      Patient Active Problem List   Diagnosis Date Noted  . S/P appendectomy 05/09/2017  . Class 2 obesity due to excess calories without serious comorbidity with body mass index (BMI) of 35.0 to 35.9 in adult 05/09/2017  . Adjustment reaction 05/24/2015  . Chromophytosis 01/31/2007  . Acid reflux 01/30/2007    Past Surgical History:  Procedure Laterality Date  . LAPAROSCOPIC APPENDECTOMY N/A 06/09/2016   Procedure: APPENDECTOMY LAPAROSCOPIC;  Surgeon: Ricarda Frame, MD;  Location: [facility] ORS;  Service: General;  Laterality: N/A;  . WISDOM TOOTH EXTRACTION  19 yrs. of age    Family History        Family Status  Relation Name Status  . Mother  Alive  . Father  Alive  . Brother  Alive  . MGF  (Not Specified)        Her family history includes Diabetes in her maternal grandfather and mother; Hyperlipidemia in her father and mother; Hypertension in her father and mother.      No Known Allergies   Current Outpatient Medications:  .  APRI 0.15-30 MG-MCG tablet, TAKE 1 TABLET BY MOUTH  DAILY, Disp: 84 tablet, Rfl: 4 .  escitalopram (LEXAPRO) 10 MG tablet, TAKE 1 TABLET BY MOUTH  DAILY, Disp: 90 tablet, Rfl: 3 .  ketoconazole (NIZORAL) 2 % shampoo, , Disp: , Rfl:  .  triamcinolone cream (KENALOG) 0.1 %, APPLY TOPICALLY 2 TIMES A DAY FOR 14 DAYS. DO NOT USE ON FACE., Disp: , Rfl: 0   Patient Care Team: Mar Daring, PA-C as PCP - General (Family Medicine)    Objective:    Vitals: There were no vitals taken for this visit.  There were no vitals filed for this visit.   Physical Exam   Depression Screen PHQ 2/9 Scores 05/09/2017 04/27/2016  PHQ - 2 Score 0 0  PHQ- 9 Score 0 -       Assessment & Plan:     Routine Health Maintenance and Physical Exam  Exercise Activities and Dietary recommendations Goals   None      Immunization History  Administered Date(s) Administered  . DTaP 12/11/1998  . Influenza Whole 10/11/2012  . Influenza,inj,Quad PF,6+ Mos 10/01/2013, 10/04/2016, 09/10/2017, 09/12/2018  . Td 03/27/2012  . Tdap 12/25/2013    Health Maintenance  Topic Date Due  . INFLUENZA VACCINE  07/12/2019  . PAP SMEAR-Modifier  05/09/2020  . TETANUS/TDAP  12/26/2023  . HIV Screening  Completed     Discussed health benefits of physical activity, and encouraged her to engage in regular exercise appropriate for her age and condition.    --------------------------------------------------------------------    Mar Daring, PA-C  [place]

## 2019-09-15 ENCOUNTER — Encounter: Payer: Self-pay | Admitting: Physician Assistant

## 2019-09-23 ENCOUNTER — Other Ambulatory Visit: Payer: Self-pay

## 2019-09-23 ENCOUNTER — Ambulatory Visit (INDEPENDENT_AMBULATORY_CARE_PROVIDER_SITE_OTHER): Payer: Managed Care, Other (non HMO)

## 2019-09-23 DIAGNOSIS — Z23 Encounter for immunization: Secondary | ICD-10-CM

## 2019-10-20 ENCOUNTER — Encounter: Payer: Self-pay | Admitting: Physician Assistant

## 2019-10-20 ENCOUNTER — Other Ambulatory Visit: Payer: Self-pay

## 2019-10-20 ENCOUNTER — Ambulatory Visit (INDEPENDENT_AMBULATORY_CARE_PROVIDER_SITE_OTHER): Payer: Managed Care, Other (non HMO) | Admitting: Physician Assistant

## 2019-10-20 VITALS — BP 134/85 | HR 89 | Temp 97.3°F | Resp 16 | Ht 65.0 in | Wt 229.2 lb

## 2019-10-20 DIAGNOSIS — Z0000 Encounter for general adult medical examination without abnormal findings: Secondary | ICD-10-CM

## 2019-10-20 DIAGNOSIS — Z6838 Body mass index (BMI) 38.0-38.9, adult: Secondary | ICD-10-CM | POA: Diagnosis not present

## 2019-10-20 NOTE — Progress Notes (Signed)
Patient: Stacey Santiago, Female    DOB: 05/06/1981, 38 y.o.   MRN: [id number] Visit Date: 10/20/2019  Today's Provider: Margaretann Loveless, PA-C   Chief Complaint  Patient presents with  . Annual Exam   Subjective:     Annual physical exam Stacey Santiago is a 38 y.o. female who presents today for health maintenance and complete physical. She feels well. She reports exercising noe. She reports she is sleeping well. ----------------------------------------------------------------- 05/15/2017 -Pap is normal, HPV negative. Will repeat in 5 years.  Review of Systems  Constitutional: Negative.   HENT: Negative.   Eyes: Negative.   Respiratory: Negative.   Cardiovascular: Negative.   Gastrointestinal: Negative.   Endocrine: Negative.   Genitourinary: Negative.   Musculoskeletal: Negative.   Skin: Negative.   Allergic/Immunologic: Negative.   Neurological: Negative.   Hematological: Negative.   Psychiatric/Behavioral: Negative.     Social History      She  reports that she has never smoked. She has never used smokeless tobacco. She reports current alcohol use. She reports that she does not use drugs.       Social History   Socioeconomic History  . Marital status: Married    Spouse name: Ramon Dredge  . Number of children: 2  . Years of education: Not on file  . Highest education level: Not on file  Occupational History  . Not on file  Social Needs  . Financial resource strain: Not on file  . Food insecurity    Worry: Not on file    Inability: Not on file  . Transportation needs    Medical: Not on file    Non-medical: Not on file  Tobacco Use  . Smoking status: Never Smoker  . Smokeless tobacco: Never Used  Substance and Sexual Activity  . Alcohol use: Yes    Comment: social  . Drug use: No  . Sexual activity: Yes    Partners: Male  Lifestyle  . Physical activity    Days per week: Not on file    Minutes per session: Not on file  . Stress: Not on file   Relationships  . Social Musician on phone: Not on file    Gets together: Not on file    Attends religious service: Not on file    Active member of club or organization: Not on file    Attends meetings of clubs or organizations: Not on file    Relationship status: Not on file  Other Topics Concern  . Not on file  Social History Narrative  . Not on file    Past Medical History:  Diagnosis Date  . Anxiety   . Depression   . GERD (gastroesophageal reflux disease)      Patient Active Problem List   Diagnosis Date Noted  . S/P appendectomy 05/09/2017  . Class 2 obesity due to excess calories without serious comorbidity with body mass index (BMI) of 35.0 to 35.9 in adult 05/09/2017  . Adjustment reaction 05/24/2015  . Chromophytosis 01/31/2007  . Acid reflux 01/30/2007    Past Surgical History:  Procedure Laterality Date  . LAPAROSCOPIC APPENDECTOMY N/A 06/09/2016   Procedure: APPENDECTOMY LAPAROSCOPIC;  Surgeon: Ricarda Frame, MD;  Location: [facility] ORS;  Service: General;  Laterality: N/A;  . WISDOM TOOTH EXTRACTION  19 yrs. of age    Family History        Family Status  Relation Name Status  . Mother  Alive  .  Father  Alive  . Brother  Alive  . MGF  (Not Specified)        Her family history includes Diabetes in her maternal grandfather and mother; Hyperlipidemia in her father and mother; Hypertension in her father and mother.      No Known Allergies   Current Outpatient Medications:  .  APRI 0.15-30 MG-MCG tablet, TAKE 1 TABLET BY MOUTH  DAILY, Disp: 84 tablet, Rfl: 4 .  escitalopram (LEXAPRO) 10 MG tablet, TAKE 1 TABLET BY MOUTH  DAILY, Disp: 90 tablet, Rfl: 3   Patient Care Team: Reine JustBurnette, Jennifer M, PA-C as PCP - General (Family Medicine)    Objective:    Vitals: BP 134/85 (BP Location: Left Arm, Patient Position: Sitting, Cuff Size: Large)   Pulse 89   Temp (!) 97.3 F (36.3 C) (Temporal)   Resp 16   Ht 5\' 5"  (1.651 m)   Wt 229 lb 3.2  oz (104 kg)   BMI 38.14 kg/m    Vitals:   10/20/19 1519  BP: 134/85  Pulse: 89  Resp: 16  Temp: (!) 97.3 F (36.3 C)  TempSrc: Temporal  Weight: 229 lb 3.2 oz (104 kg)  Height: 5\' 5"  (1.651 m)     Physical Exam Vitals signs reviewed.  Constitutional:      General: She is not in acute distress.    Appearance: Normal appearance. She is well-developed. She is obese. She is not diaphoretic.  HENT:     Head: Normocephalic and atraumatic.     Right Ear: Tympanic membrane, ear canal and external ear normal.     Left Ear: Tympanic membrane, ear canal and external ear normal.     Nose: Nose normal.     Mouth/Throat:     Mouth: Mucous membranes are moist.     Pharynx: Oropharynx is clear. No oropharyngeal exudate.  Eyes:     General: No scleral icterus.       Right eye: No discharge.        Left eye: No discharge.     Extraocular Movements: Extraocular movements intact.     Conjunctiva/sclera: Conjunctivae normal.     Pupils: Pupils are equal, round, and reactive to light.  Neck:     Musculoskeletal: Normal range of motion and neck supple.     Thyroid: No thyromegaly.     Vascular: No carotid bruit or JVD.     Trachea: No tracheal deviation.  Cardiovascular:     Rate and Rhythm: Normal rate and regular rhythm.     Pulses: Normal pulses.     Heart sounds: Normal heart sounds. No murmur. No friction rub. No gallop.   Pulmonary:     Effort: Pulmonary effort is normal. No respiratory distress.     Breath sounds: Normal breath sounds. No wheezing or rales.  Chest:     Chest wall: No tenderness.     Breasts:        Right: Tenderness present. No mass, nipple discharge or skin change.        Left: Normal. No mass, nipple discharge or skin change.    Abdominal:     General: Bowel sounds are normal. There is no distension.     Palpations: Abdomen is soft. There is no mass.     Tenderness: There is no abdominal tenderness. There is no guarding or rebound.  Musculoskeletal:  Normal range of motion.        General: No tenderness.     Right lower leg:  No edema.     Left lower leg: No edema.  Lymphadenopathy:     Cervical: No cervical adenopathy.     Upper Body:     Right upper body: No supraclavicular, axillary or pectoral adenopathy.     Left upper body: No supraclavicular, axillary or pectoral adenopathy.  Skin:    General: Skin is warm and dry.     Capillary Refill: Capillary refill takes less than 2 seconds.     Findings: No rash.  Neurological:     General: No focal deficit present.     Mental Status: She is alert and oriented to person, place, and time. Mental status is at baseline.  Psychiatric:        Mood and Affect: Mood normal.        Behavior: Behavior normal.        Thought Content: Thought content normal.        Judgment: Judgment normal.      Depression Screen PHQ 2/9 Scores 05/09/2017 04/27/2016  PHQ - 2 Score 0 0  PHQ- 9 Score 0 -       Assessment & Plan:     Routine Health Maintenance and Physical Exam  Exercise Activities and Dietary recommendations Goals   None     Immunization History  Administered Date(s) Administered  . DTaP 12/11/1998  . Influenza Whole 10/11/2012  . Influenza,inj,Quad PF,6+ Mos 10/01/2013, 10/04/2016, 09/10/2017, 09/12/2018, 09/23/2019  . Td 03/27/2012  . Tdap 12/25/2013    Health Maintenance  Topic Date Due  . PAP SMEAR-Modifier  05/09/2020  . TETANUS/TDAP  12/26/2023  . INFLUENZA VACCINE  Completed  . HIV Screening  Completed     Discussed health benefits of physical activity, and encouraged her to engage in regular exercise appropriate for her age and condition.    1. Annual physical exam Normal physical exam today. Will check labs as below and f/u pending lab results. If labs are stable and WNL she will not need to have these rechecked for one year at her next annual physical exam. She is to call the office in the meantime if she has any acute issue, questions or concerns. - CBC  w/Diff - Comprehensive Metabolic Panel (CMET) - Lipid Profile - TSH - HgB A1c  2. Class 2 severe obesity due to excess calories with serious comorbidity and body mass index (BMI) of 38.0 to 38.9 in adult [facility]) Counseled patient on healthy lifestyle modifications including dieting and exercise.  - CBC w/Diff - Comprehensive Metabolic Panel (CMET) - Lipid Profile - TSH - HgB A1c  --------------------------------------------------------------------    Mar Daring, PA-C  [facility]

## 2019-10-20 NOTE — Patient Instructions (Signed)
Health Maintenance, Female Adopting a healthy lifestyle and getting preventive care are important in promoting health and wellness. Ask your health care provider about:  The right schedule for you to have regular tests and exams.  Things you can do on your own to prevent diseases and keep yourself healthy. What should I know about diet, weight, and exercise? Eat a healthy diet   Eat a diet that includes plenty of vegetables, fruits, low-fat dairy products, and lean protein.  Do not eat a lot of foods that are high in solid fats, added sugars, or sodium. Maintain a healthy weight Body mass index (BMI) is used to identify weight problems. It estimates body fat based on height and weight. Your health care provider can help determine your BMI and help you achieve or maintain a healthy weight. Get regular exercise Get regular exercise. This is one of the most important things you can do for your health. Most adults should:  Exercise for at least 150 minutes each week. The exercise should increase your heart rate and make you sweat (moderate-intensity exercise).  Do strengthening exercises at least twice a week. This is in addition to the moderate-intensity exercise.  Spend less time sitting. Even light physical activity can be beneficial. Watch cholesterol and blood lipids Have your blood tested for lipids and cholesterol at 38 years of age, then have this test every 5 years. Have your cholesterol levels checked more often if:  Your lipid or cholesterol levels are high.  You are older than 38 years of age.  You are at high risk for heart disease. What should I know about cancer screening? Depending on your health history and family history, you may need to have cancer screening at various ages. This may include screening for:  Breast cancer.  Cervical cancer.  Colorectal cancer.  Skin cancer.  Lung cancer. What should I know about heart disease, diabetes, and high blood  pressure? Blood pressure and heart disease  High blood pressure causes heart disease and increases the risk of stroke. This is more likely to develop in people who have high blood pressure readings, are of African descent, or are overweight.  Have your blood pressure checked: ? Every 3-5 years if you are 18-39 years of age. ? Every year if you are 40 years old or older. Diabetes Have regular diabetes screenings. This checks your fasting blood sugar level. Have the screening done:  Once every three years after age 40 if you are at a normal weight and have a low risk for diabetes.  More often and at a younger age if you are overweight or have a high risk for diabetes. What should I know about preventing infection? Hepatitis B If you have a higher risk for hepatitis B, you should be screened for this virus. Talk with your health care provider to find out if you are at risk for hepatitis B infection. Hepatitis C Testing is recommended for:  Everyone born from 1945 through 1965.  Anyone with known risk factors for hepatitis C. Sexually transmitted infections (STIs)  Get screened for STIs, including gonorrhea and chlamydia, if: ? You are sexually active and are younger than 38 years of age. ? You are older than 38 years of age and your health care provider tells you that you are at risk for this type of infection. ? Your sexual activity has changed since you were last screened, and you are at increased risk for chlamydia or gonorrhea. Ask your health care provider if   you are at risk.  Ask your health care provider about whether you are at high risk for HIV. Your health care provider may recommend a prescription medicine to help prevent HIV infection. If you choose to take medicine to prevent HIV, you should first get tested for HIV. You should then be tested every 3 months for as long as you are taking the medicine. Pregnancy  If you are about to stop having your period (premenopausal) and  you may become pregnant, seek counseling before you get pregnant.  Take 400 to 800 micrograms (mcg) of folic acid every day if you become pregnant.  Ask for birth control (contraception) if you want to prevent pregnancy. Osteoporosis and menopause Osteoporosis is a disease in which the bones lose minerals and strength with aging. This can result in bone fractures. If you are 65 years old or older, or if you are at risk for osteoporosis and fractures, ask your health care provider if you should:  Be screened for bone loss.  Take a calcium or vitamin D supplement to lower your risk of fractures.  Be given hormone replacement therapy (HRT) to treat symptoms of menopause. Follow these instructions at home: Lifestyle  Do not use any products that contain nicotine or tobacco, such as cigarettes, e-cigarettes, and chewing tobacco. If you need help quitting, ask your health care provider.  Do not use street drugs.  Do not share needles.  Ask your health care provider for help if you need support or information about quitting drugs. Alcohol use  Do not drink alcohol if: ? Your health care provider tells you not to drink. ? You are pregnant, may be pregnant, or are planning to become pregnant.  If you drink alcohol: ? Limit how much you use to 0-1 drink a day. ? Limit intake if you are breastfeeding.  Be aware of how much alcohol is in your drink. In the [place] one drink equals one 12 oz bottle of beer (355 mL), one 5 oz glass of wine (148 mL), or one 1 oz glass of hard liquor (44 mL). General instructions  Schedule regular health, dental, and eye exams.  Stay current with your vaccines.  Tell your health care provider if: ? You often feel depressed. ? You have ever been abused or do not feel safe at home. Summary  Adopting a healthy lifestyle and getting preventive care are important in promoting health and wellness.  Follow your health care provider's instructions about healthy  diet, exercising, and getting tested or screened for diseases.  Follow your health care provider's instructions on monitoring your cholesterol and blood pressure. This information is not intended to replace advice given to you by your health care provider. Make sure you discuss any questions you have with your health care provider. Document Released: 06/12/2011 Document Revised: 11/20/2018 Document Reviewed: 11/20/2018 Elsevier Patient Education  2020 Elsevier Inc.  

## 2019-10-22 ENCOUNTER — Telehealth: Payer: Self-pay

## 2019-10-22 LAB — CBC WITH DIFFERENTIAL/PLATELET
Basophils Absolute: 0.1 10*3/uL (ref 0.0–0.2)
Basos: 1 %
EOS (ABSOLUTE): 0.2 10*3/uL (ref 0.0–0.4)
Eos: 2 %
Hematocrit: 36 % (ref 34.0–46.6)
Hemoglobin: 12.1 g/dL (ref 11.1–15.9)
Immature Grans (Abs): 0 10*3/uL (ref 0.0–0.1)
Immature Granulocytes: 0 %
Lymphocytes Absolute: 1.8 10*3/uL (ref 0.7–3.1)
Lymphs: 18 %
MCH: 29.2 pg (ref 26.6–33.0)
MCHC: 33.6 g/dL (ref 31.5–35.7)
MCV: 87 fL (ref 79–97)
Monocytes Absolute: 0.5 10*3/uL (ref 0.1–0.9)
Monocytes: 5 %
Neutrophils Absolute: 7.2 10*3/uL — ABNORMAL HIGH (ref 1.4–7.0)
Neutrophils: 74 %
Platelets: 330 10*3/uL (ref 150–450)
RBC: 4.14 x10E6/uL (ref 3.77–5.28)
RDW: 13.5 % (ref 11.7–15.4)
WBC: 9.7 10*3/uL (ref 3.4–10.8)

## 2019-10-22 LAB — COMPREHENSIVE METABOLIC PANEL
ALT: 11 IU/L (ref 0–32)
AST: 10 IU/L (ref 0–40)
Albumin/Globulin Ratio: 1.4 (ref 1.2–2.2)
Albumin: 3.9 g/dL (ref 3.8–4.8)
Alkaline Phosphatase: 89 IU/L (ref 39–117)
BUN/Creatinine Ratio: 9 (ref 9–23)
BUN: 9 mg/dL (ref 6–20)
Bilirubin Total: <0.2 mg/dL (ref 0.0–1.2)
CO2: 20 mmol/L (ref 20–29)
Calcium: 9.3 mg/dL (ref 8.7–10.2)
Chloride: 102 mmol/L (ref 96–106)
Creatinine, Ser: 0.96 mg/dL (ref 0.57–1.00)
GFR calc Af Amer: 87 mL/min/{1.73_m2} (ref >59)
GFR calc non Af Amer: 75 mL/min/{1.73_m2} (ref >59)
Globulin, Total: 2.7 g/dL (ref 1.5–4.5)
Glucose: 92 mg/dL (ref 65–99)
Potassium: 4.7 mmol/L (ref 3.5–5.2)
Sodium: 137 mmol/L (ref 134–144)
Total Protein: 6.6 g/dL (ref 6.0–8.5)

## 2019-10-22 LAB — TSH: TSH: 1.77 u[IU]/mL (ref 0.450–4.500)

## 2019-10-22 LAB — HEMOGLOBIN A1C
Est. average glucose Bld gHb Est-mCnc: 111 mg/dL
Hgb A1c MFr Bld: 5.5 % (ref 4.8–5.6)

## 2019-10-22 LAB — LIPID PANEL
Chol/HDL Ratio: 2.8 ratio (ref 0.0–4.4)
Cholesterol, Total: 182 mg/dL (ref 100–199)
HDL: 66 mg/dL (ref >39)
LDL Chol Calc (NIH): 101 mg/dL — ABNORMAL HIGH (ref 0–99)
Triglycerides: 82 mg/dL (ref 0–149)
VLDL Cholesterol Cal: 15 mg/dL (ref 5–40)

## 2019-10-22 NOTE — Telephone Encounter (Signed)
LMTCB

## 2019-10-22 NOTE — Telephone Encounter (Signed)
-----   Message from Mar Daring, [place] sent at 10/22/2019  8:12 AM EST ----- Blood count is normal. Kidney and liver function are normal. Sodium, potassium and calcium are normal. Cholesterol is normal. Thyroid is normal. A1c is still pending. Should result later today.

## 2019-10-23 NOTE — Telephone Encounter (Signed)
Viewed by Gweneth Fritter on 10/22/2019 8:44 AM

## 2019-11-27 ENCOUNTER — Other Ambulatory Visit: Payer: Self-pay | Admitting: Physician Assistant

## 2019-11-27 DIAGNOSIS — F4322 Adjustment disorder with anxiety: Secondary | ICD-10-CM

## 2019-11-28 NOTE — Telephone Encounter (Signed)
Requested medication (s) are due for refill today: yes  Requested medication (s) are on the active medication list: yes  Last refill: 10/07/2019  Future visit scheduled: no  Notes to clinic: over due for medication follow up   Requested Prescriptions  Pending Prescriptions Disp Refills   escitalopram (LEXAPRO) 10 MG tablet [Pharmacy Med Name: ESCITALOPRAM  10MG   TAB] 90 tablet 3    Sig: TAKE 1 TABLET BY MOUTH  DAILY      Psychiatry:  Antidepressants - SSRI Failed - 11/27/2019  9:56 PM      Failed - Valid encounter within last 6 months    Recent Outpatient Visits           1 month ago Annual physical exam   [facility] [place], Clearnce Sorrel, [place]   1 year ago Annual physical exam   [place], Clearnce Sorrel, [place]   2 years ago Viral URI with cough   [facility] [place], Clearnce Sorrel, [place]   2 years ago Annual physical exam   [place], [place]   3 years ago Acute pain of left knee   [facility] [place], [place], [place]

## 2020-02-10 ENCOUNTER — Other Ambulatory Visit: Payer: Self-pay | Admitting: Physician Assistant

## 2020-02-10 DIAGNOSIS — Z3009 Encounter for other general counseling and advice on contraception: Secondary | ICD-10-CM

## 2020-02-11 NOTE — Telephone Encounter (Signed)
Requested medication (s) are due for refill today: yes  Requested medication (s) are on the active medication list: yes  Last refill:  12/27/19  Future visit scheduled: no  Notes to clinic:  no valid encounter within last 12 months    Requested Prescriptions  Pending Prescriptions Disp Refills   APRI 0.15-30 MG-MCG tablet [Pharmacy Med Name: APRI  TAB  28] 84 tablet 3    Sig: TAKE 1 TABLET BY MOUTH  DAILY      OB/GYN:  Contraceptives Failed - 02/10/2020  9:43 PM      Failed - Valid encounter within last 12 months    Recent Outpatient Visits           3 months ago Annual physical exam   [facility] [place], Alessandra Bevels, [place]   1 year ago Annual physical exam   [facility] [place], Alessandra Bevels, [place]   2 years ago Viral URI with cough   [facility] [place], Alessandra Bevels, [place]   2 years ago Annual physical exam   [facility] Joycelyn Man M, [place]   3 years ago Acute pain of left knee   [facility] Joycelyn Man [place], [place]              Passed - Last BP in normal range    BP Readings from Last 1 Encounters:  10/20/19 134/85

## 2020-09-10 NOTE — Progress Notes (Signed)
Established patient visit   Patient: Stacey Santiago   DOB: [birth year]   39 y.o. Female  MRN: [id number] Visit Date: 09/13/2020  Today's healthcare provider: Margaretann Loveless, PA-C   Chief Complaint  Patient presents with  . Form Completion   Subjective    HPI  Patient coming in to have appeal form fill out. Last CPE was 10/20/19. Had labs done through employer over the summer for her biometric screen.   Influenza vaccine today.  Patient Active Problem List   Diagnosis Date Noted  . S/P appendectomy 05/09/2017  . Class 2 severe obesity due to excess calories with serious comorbidity and body mass index (BMI) of 38.0 to 38.9 in adult (HCC) 05/09/2017  . Adjustment reaction 05/24/2015  . Chromophytosis 01/31/2007  . Acid reflux 01/30/2007   Past Medical History:  Diagnosis Date  . Anxiety   . Depression   . GERD (gastroesophageal reflux disease)        Medications: Outpatient Medications Prior to Visit  Medication Sig  . APRI 0.15-30 MG-MCG tablet TAKE 1 TABLET BY MOUTH  DAILY  . escitalopram (LEXAPRO) 10 MG tablet TAKE 1 TABLET BY MOUTH  DAILY   No facility-administered medications prior to visit.    Review of Systems  Constitutional: Negative.   Respiratory: Negative.   Cardiovascular: Negative.   Gastrointestinal: Negative.   Neurological: Negative.     Last CBC Lab Results  Component Value Date   WBC 9.7 10/21/2019   HGB 12.1 10/21/2019   HCT 36.0 10/21/2019   MCV 87 10/21/2019   MCH 29.2 10/21/2019   RDW 13.5 10/21/2019   PLT 330 10/21/2019   Last metabolic panel Lab Results  Component Value Date   GLUCOSE 92 10/21/2019   NA 137 10/21/2019   K 4.7 10/21/2019   CL 102 10/21/2019   CO2 20 10/21/2019   BUN 9 10/21/2019   CREATININE 0.96 10/21/2019   GFRNONAA 75 10/21/2019   GFRAA 87 10/21/2019   CALCIUM 9.3 10/21/2019   PROT 6.6 10/21/2019   ALBUMIN 3.9 10/21/2019   LABGLOB 2.7 10/21/2019   AGRATIO 1.4 10/21/2019   BILITOT <0.2  10/21/2019   ALKPHOS 89 10/21/2019   AST 10 10/21/2019   ALT 11 10/21/2019   ANIONGAP 6 06/10/2016      Objective    BP 110/82 (BP Location: Left Arm, Patient Position: Sitting, Cuff Size: Large)   Pulse 79   Temp 99.1 F (37.3 C) (Oral)   Resp 16   Ht 5\' 5"  (1.651 m)   Wt 222 lb 12.8 oz (101.1 kg)   BMI 37.08 kg/m  BP Readings from Last 3 Encounters:  09/13/20 110/82  10/20/19 134/85  09/12/18 104/80   Wt Readings from Last 3 Encounters:  09/13/20 222 lb 12.8 oz (101.1 kg)  10/20/19 229 lb 3.2 oz (104 kg)  09/12/18 220 lb 12.8 oz (100.2 kg)      Physical Exam Vitals reviewed.  Constitutional:      General: She is not in acute distress.    Appearance: Normal appearance. She is well-developed. She is obese. She is not ill-appearing or diaphoretic.  Cardiovascular:     Rate and Rhythm: Normal rate and regular rhythm.     Heart sounds: Normal heart sounds. No murmur heard.  No friction rub. No gallop.   Pulmonary:     Effort: Pulmonary effort is normal. No respiratory distress.     Breath sounds: Normal breath sounds. No wheezing or rales.  Musculoskeletal:  Cervical back: Normal range of motion and neck supple.  Neurological:     Mental Status: She is alert.      No results found for any visits on 09/13/20.  Assessment & Plan     1. Class 2 severe obesity due to excess calories with serious comorbidity and body mass index (BMI) of 37.0 to 37.9 in adult [facility]) Counseled patient on healthy lifestyle modifications including dieting and exercise. Appeal form completed.  2. Need for influenza vaccination Flu vaccine given today without complication. Patient sat upright for 15 minutes to check for adverse reaction before being released. - Flu Vaccine QUAD 36+ mos IM  I spent approximately 22 minutes with the patient today. Over 50% of this time was spent with counseling and educating the patient.  No follow-ups on file.      Delmer Islam, PA-C,  have reviewed all documentation for this visit. The documentation on 09/16/20 for the exam, diagnosis, procedures, and orders are all accurate and complete.   Reine Just  [facility] [phone] (phone) [phone] (fax)  [facility]

## 2020-09-13 ENCOUNTER — Ambulatory Visit (INDEPENDENT_AMBULATORY_CARE_PROVIDER_SITE_OTHER): Payer: Managed Care, Other (non HMO) | Admitting: Physician Assistant

## 2020-09-13 ENCOUNTER — Encounter: Payer: Self-pay | Admitting: Physician Assistant

## 2020-09-13 ENCOUNTER — Other Ambulatory Visit: Payer: Self-pay

## 2020-09-13 VITALS — BP 110/82 | HR 79 | Temp 99.1°F | Resp 16 | Ht 65.0 in | Wt 222.8 lb

## 2020-09-13 DIAGNOSIS — Z23 Encounter for immunization: Secondary | ICD-10-CM

## 2020-09-13 DIAGNOSIS — Z6837 Body mass index (BMI) 37.0-37.9, adult: Secondary | ICD-10-CM

## 2020-09-16 ENCOUNTER — Encounter: Payer: Self-pay | Admitting: Physician Assistant

## 2020-10-18 NOTE — Progress Notes (Signed)
Complete physical exam   Patient: Stacey Santiago   DOB: May 10, 1981   39 y.o. Female  MRN: [id number] Visit Date: 10/20/2020  Today's healthcare provider: Margaretann Loveless, PA-C   Chief Complaint  Patient presents with  . Annual Exam   Subjective    Stacey Santiago is a 39 y.o. female who presents today for a complete physical exam.  She reports consuming a general diet. Home exercise routine includes walks 30 minutes every day. She generally feels well. She reports sleeping well. She does not have additional problems to discuss today.  HPI  05/15/2017 at 8:20 -Pap is normal, HPV negative. Will repeat in 3 -5 years.   Past Medical History:  Diagnosis Date  . Anxiety   . Depression   . GERD (gastroesophageal reflux disease)    Past Surgical History:  Procedure Laterality Date  . LAPAROSCOPIC APPENDECTOMY N/A 06/09/2016   Procedure: APPENDECTOMY LAPAROSCOPIC;  Surgeon: Ricarda Frame, MD;  Location: [facility] ORS;  Service: General;  Laterality: N/A;  . WISDOM TOOTH EXTRACTION  19 yrs. of age   Social History   Socioeconomic History  . Marital status: Married    Spouse name: Ramon Dredge  . Number of children: 2  . Years of education: Not on file  . Highest education level: Not on file  Occupational History  . Not on file  Tobacco Use  . Smoking status: Never Smoker  . Smokeless tobacco: Never Used  Substance and Sexual Activity  . Alcohol use: Yes    Comment: social  . Drug use: No  . Sexual activity: Yes    Partners: Male  Other Topics Concern  . Not on file  Social History Narrative  . Not on file   Social Determinants of Health   Financial Resource Strain:   . Difficulty of Paying Living Expenses: Not on file  Food Insecurity:   . Worried About Programme researcher, broadcasting/film/video in the Last Year: Not on file  . Ran Out of Food in the Last Year: Not on file  Transportation Needs:   . Lack of Transportation (Medical): Not on file  . Lack of Transportation (Non-Medical): Not on  file  Physical Activity:   . Days of Exercise per Week: Not on file  . Minutes of Exercise per Session: Not on file  Stress:   . Feeling of Stress : Not on file  Social Connections:   . Frequency of Communication with Friends and Family: Not on file  . Frequency of Social Gatherings with Friends and Family: Not on file  . Attends Religious Services: Not on file  . Active Member of Clubs or Organizations: Not on file  . Attends Banker Meetings: Not on file  . Marital Status: Not on file  Intimate Partner Violence:   . Fear of Current or Ex-Partner: Not on file  . Emotionally Abused: Not on file  . Physically Abused: Not on file  . Sexually Abused: Not on file   Family Status  Relation Name Status  . Mother  Alive  . Father  Alive  . Brother  Alive  . MGF  (Not Specified)   Family History  Problem Relation Age of Onset  . Diabetes Mother   . Hypertension Mother   . Hyperlipidemia Mother   . Hypertension Father   . Hyperlipidemia Father   . Diabetes Maternal Grandfather    No Known Allergies  Patient Care Team: Reine Just as PCP - General (Family Medicine)  Medications: Outpatient Medications Prior to Visit  Medication Sig  . APRI 0.15-30 MG-MCG tablet TAKE 1 TABLET BY MOUTH  DAILY  . escitalopram (LEXAPRO) 10 MG tablet TAKE 1 TABLET BY MOUTH  DAILY   No facility-administered medications prior to visit.    Review of Systems  Constitutional: Negative.   HENT: Negative.   Eyes: Negative.   Respiratory: Negative.   Cardiovascular: Negative.   Gastrointestinal: Negative.   Endocrine: Negative.   Genitourinary: Negative.   Musculoskeletal: Negative.   Skin: Negative.   Allergic/Immunologic: Negative.   Neurological: Negative.   Hematological: Negative.   Psychiatric/Behavioral: Negative.     Last CBC Lab Results  Component Value Date   WBC 6.4 10/20/2020   HGB 11.9 10/20/2020   HCT 37.2 10/20/2020   MCV 87 10/20/2020   MCH  27.9 10/20/2020   RDW 14.0 10/20/2020   PLT 344 10/20/2020   Last metabolic panel Lab Results  Component Value Date   GLUCOSE 87 10/20/2020   NA 137 10/20/2020   K 4.8 10/20/2020   CL 104 10/20/2020   CO2 19 (L) 10/20/2020   BUN 8 10/20/2020   CREATININE 0.95 10/20/2020   GFRNONAA 76 10/20/2020   GFRAA 87 10/20/2020   CALCIUM 9.5 10/20/2020   PROT 6.7 10/20/2020   ALBUMIN 4.0 10/20/2020   LABGLOB 2.7 10/20/2020   AGRATIO 1.5 10/20/2020   BILITOT <0.2 10/20/2020   ALKPHOS 83 10/20/2020   AST 12 10/20/2020   ALT 8 10/20/2020   ANIONGAP 6 06/10/2016      Objective    BP 101/68 (BP Location: Left Arm, Patient Position: Sitting, Cuff Size: Large)   Pulse 74   Temp 98 F (36.7 C) (Oral)   Resp 16   Ht 5\' 5"  (1.651 m)   Wt 224 lb 9.6 oz (101.9 kg)   LMP 09/23/2020   BMI 37.38 kg/m  BP Readings from Last 3 Encounters:  10/20/20 101/68  09/13/20 110/82  10/20/19 134/85   Wt Readings from Last 3 Encounters:  10/20/20 224 lb 9.6 oz (101.9 kg)  09/13/20 222 lb 12.8 oz (101.1 kg)  10/20/19 229 lb 3.2 oz (104 kg)      Physical Exam Vitals reviewed.  Constitutional:      General: She is not in acute distress.    Appearance: Normal appearance. She is well-developed. She is obese. She is not ill-appearing or diaphoretic.  HENT:     Head: Normocephalic and atraumatic.     Right Ear: Hearing, tympanic membrane, ear canal and external ear normal.     Left Ear: Hearing, tympanic membrane, ear canal and external ear normal.     Nose: Nose normal.     Mouth/Throat:     Mouth: Mucous membranes are moist.     Pharynx: Oropharynx is clear. Uvula midline. No oropharyngeal exudate.  Eyes:     General: No scleral icterus.       Right eye: No discharge.        Left eye: No discharge.     Extraocular Movements: Extraocular movements intact.     Conjunctiva/sclera: Conjunctivae normal.     Pupils: Pupils are equal, round, and reactive to light.  Neck:     Thyroid: No  thyromegaly.     Vascular: No carotid bruit or JVD.     Trachea: No tracheal deviation.  Cardiovascular:     Rate and Rhythm: Normal rate and regular rhythm.     Pulses: Normal pulses.     Heart sounds:  Normal heart sounds. No murmur heard.  No friction rub. No gallop.   Pulmonary:     Effort: Pulmonary effort is normal. No respiratory distress.     Breath sounds: Normal breath sounds. No wheezing or rales.  Chest:     Chest wall: No tenderness.     Breasts: Breasts are symmetrical.        Right: No inverted nipple, mass, nipple discharge, skin change or tenderness.        Left: No inverted nipple, mass, nipple discharge, skin change or tenderness.  Abdominal:     General: Abdomen is flat. Bowel sounds are normal. There is no distension.     Palpations: Abdomen is soft. There is no mass.     Tenderness: There is no abdominal tenderness. There is no guarding or rebound.     Hernia: There is no hernia in the left inguinal area.  Genitourinary:    Exam position: Supine.     Labia:        Right: No rash, tenderness, lesion or injury.        Left: No rash, tenderness, lesion or injury.      Vagina: Normal. No signs of injury. No vaginal discharge, erythema, tenderness or bleeding.     Cervix: No cervical motion tenderness, discharge or friability.     Adnexa:        Right: No mass, tenderness or fullness.         Left: No mass, tenderness or fullness.       Rectum: Normal.  Musculoskeletal:        General: No tenderness. Normal range of motion.     Cervical back: Normal range of motion and neck supple. No tenderness.     Right lower leg: No edema.     Left lower leg: No edema.  Lymphadenopathy:     Cervical: No cervical adenopathy.  Skin:    General: Skin is warm and dry.     Capillary Refill: Capillary refill takes less than 2 seconds.     Findings: No rash.  Neurological:     General: No focal deficit present.     Mental Status: She is alert and oriented to person, place,  and time. Mental status is at baseline.     Cranial Nerves: No cranial nerve deficit.     Coordination: Coordination normal.     Deep Tendon Reflexes: Reflexes are normal and symmetric.  Psychiatric:        Mood and Affect: Mood normal.        Behavior: Behavior normal.        Thought Content: Thought content normal.        Judgment: Judgment normal.       Last depression screening scores PHQ 2/9 Scores 10/20/2020 09/13/2020 09/13/2020  PHQ - 2 Score 0 0 0  PHQ- 9 Score - - -   Last fall risk screening Fall Risk  10/20/2020  Falls in the past year? 0  Number falls in past yr: 0  Injury with Fall? 0  Risk for fall due to : No Fall Risks  Follow up Falls evaluation completed   Last Audit-C alcohol use screening Alcohol Use Disorder Test (AUDIT) 09/13/2020  1. How often do you have a drink containing alcohol? 2  2. How many drinks containing alcohol do you have on a typical day when you are drinking? 0  3. How often do you have six or more drinks on one occasion? 0  AUDIT-C  Score 2  Alcohol Brief Interventions/Follow-up AUDIT Score <7 follow-up not indicated   A score of 3 or more in women, and 4 or more in men indicates increased risk for alcohol abuse, EXCEPT if all of the points are from question 1   No results found for any visits on 10/20/20.  Assessment & Plan    Routine Health Maintenance and Physical Exam  Exercise Activities and Dietary recommendations Goals   None     Immunization History  Administered Date(s) Administered  . DTaP 12/11/1998  . Influenza Whole 10/11/2012  . Influenza,inj,Quad PF,6+ Mos 10/01/2013, 10/04/2016, 09/10/2017, 09/12/2018, 09/23/2019, 09/13/2020  . PFIZER SARS-COV-2 Vaccination 03/04/2020, 03/25/2020  . Td 03/27/2012  . Tdap 12/25/2013    Health Maintenance  Topic Date Due  . Hepatitis C Screening  Never done  . PAP SMEAR-Modifier  05/09/2022  . TETANUS/TDAP  12/26/2023  . INFLUENZA VACCINE  Completed  . COVID-19 Vaccine   Completed  . HIV Screening  Completed    Discussed health benefits of physical activity, and encouraged her to engage in regular exercise appropriate for her age and condition.  1. Annual physical exam Normal physical exam today. Will check labs as below and f/u pending lab results. If labs are stable and WNL she will not need to have these rechecked for one year at her next annual physical exam. She is to call the office in the meantime if she has any acute issue, questions or concerns. - CBC with Differential/Platelet - Comprehensive metabolic panel - Hemoglobin A1c - TSH - Lipid panel  2. Cervical cancer screening Pap collected today. Will send as below and f/u pending results. - IGP,Aptima HPV,CtNg Age Gdln  3. Class 2 severe obesity due to excess calories with serious comorbidity and body mass index (BMI) of 37.0 to 37.9 in adult [facility]) Counseled patient on healthy lifestyle modifications including dieting and exercise. Will check labs as below and f/u pending results. - CBC with Differential/Platelet - Comprehensive metabolic panel - Hemoglobin A1c - TSH - Lipid panel  4. Encounter for hepatitis C screening test for low risk patient Will check labs as below and f/u pending results. - Hepatitis C antibody  5. Family planning Stable. Diagnosis pulled for medication refill. Continue current medical treatment plan. - desogestrel-ethinyl estradiol (APRI) 0.15-30 MG-MCG tablet; Take 1 tablet by mouth daily.  Dispense: 84 tablet; Refill: 3  6. Adjustment disorder with anxious mood Stable. Diagnosis pulled for medication refill. Continue current medical treatment plan. - escitalopram (LEXAPRO) 10 MG tablet; Take 1 tablet (10 mg total) by mouth daily.  Dispense: 90 tablet; Refill: 3   No follow-ups on file.     Delmer Islam, PA-C, have reviewed all documentation for this visit. The documentation on 10/25/20 for the exam, diagnosis, procedures, and orders are all  accurate and complete.   Reine Just  [facility] [phone] (phone) [phone] (fax)  [facility]

## 2020-10-20 ENCOUNTER — Ambulatory Visit (INDEPENDENT_AMBULATORY_CARE_PROVIDER_SITE_OTHER): Payer: Managed Care, Other (non HMO) | Admitting: Physician Assistant

## 2020-10-20 ENCOUNTER — Encounter: Payer: Self-pay | Admitting: Physician Assistant

## 2020-10-20 ENCOUNTER — Other Ambulatory Visit: Payer: Self-pay

## 2020-10-20 VITALS — BP 101/68 | HR 74 | Temp 98.0°F | Resp 16 | Ht 65.0 in | Wt 224.6 lb

## 2020-10-20 DIAGNOSIS — Z124 Encounter for screening for malignant neoplasm of cervix: Secondary | ICD-10-CM

## 2020-10-20 DIAGNOSIS — Z6837 Body mass index (BMI) 37.0-37.9, adult: Secondary | ICD-10-CM

## 2020-10-20 DIAGNOSIS — Z1159 Encounter for screening for other viral diseases: Secondary | ICD-10-CM | POA: Diagnosis not present

## 2020-10-20 DIAGNOSIS — Z3009 Encounter for other general counseling and advice on contraception: Secondary | ICD-10-CM

## 2020-10-20 DIAGNOSIS — Z0000 Encounter for general adult medical examination without abnormal findings: Secondary | ICD-10-CM | POA: Diagnosis not present

## 2020-10-20 DIAGNOSIS — F4322 Adjustment disorder with anxiety: Secondary | ICD-10-CM

## 2020-10-20 MED ORDER — ESCITALOPRAM OXALATE 10 MG PO TABS: 10.0000 mg | ORAL_TABLET | Freq: Every day | ORAL | 3 refills | Status: DC

## 2020-10-20 MED ORDER — DESOGESTREL-ETHINYL ESTRADIOL 0.15-30 MG-MCG PO TABS: 1.0000 | ORAL_TABLET | Freq: Every day | ORAL | 3 refills | Status: DC

## 2020-10-20 NOTE — Patient Instructions (Signed)
Preventive Care 21-39 Years Old, Female Preventive care refers to visits with your health care provider and lifestyle choices that can promote health and wellness. This includes:  A yearly physical exam. This may also be called an annual well check.  Regular dental visits and eye exams.  Immunizations.  Screening for certain conditions.  Healthy lifestyle choices, such as eating a healthy diet, getting regular exercise, not using drugs or products that contain nicotine and tobacco, and limiting alcohol use. What can I expect for my preventive care visit? Physical exam Your health care provider will check your:  Height and weight. This may be used to calculate body mass index (BMI), which tells if you are at a healthy weight.  Heart rate and blood pressure.  Skin for abnormal spots. Counseling Your health care provider may ask you questions about your:  Alcohol, tobacco, and drug use.  Emotional well-being.  Home and relationship well-being.  Sexual activity.  Eating habits.  Work and work environment.  Method of birth control.  Menstrual cycle.  Pregnancy history. What immunizations do I need?  Influenza (flu) vaccine  This is recommended every year. Tetanus, diphtheria, and pertussis (Tdap) vaccine  You may need a Td booster every 10 years. Varicella (chickenpox) vaccine  You may need this if you have not been vaccinated. Human papillomavirus (HPV) vaccine  If recommended by your health care provider, you may need three doses over 6 months. Measles, mumps, and rubella (MMR) vaccine  You may need at least one dose of MMR. You may also need a second dose. Meningococcal conjugate (MenACWY) vaccine  One dose is recommended if you are age 19-21 years and a first-year college student living in a residence hall, or if you have one of several medical conditions. You may also need additional booster doses. Pneumococcal conjugate (PCV13) vaccine  You may need  this if you have certain conditions and were not previously vaccinated. Pneumococcal polysaccharide (PPSV23) vaccine  You may need one or two doses if you smoke cigarettes or if you have certain conditions. Hepatitis A vaccine  You may need this if you have certain conditions or if you travel or work in places where you may be exposed to hepatitis A. Hepatitis B vaccine  You may need this if you have certain conditions or if you travel or work in places where you may be exposed to hepatitis B. Haemophilus influenzae type b (Hib) vaccine  You may need this if you have certain conditions. You may receive vaccines as individual doses or as more than one vaccine together in one shot (combination vaccines). Talk with your health care provider about the risks and benefits of combination vaccines. What tests do I need?  Blood tests  Lipid and cholesterol levels. These may be checked every 5 years starting at age 20.  Hepatitis C test.  Hepatitis B test. Screening  Diabetes screening. This is done by checking your blood sugar (glucose) after you have not eaten for a while (fasting).  Sexually transmitted disease (STD) testing.  BRCA-related cancer screening. This may be done if you have a family history of breast, ovarian, tubal, or peritoneal cancers.  Pelvic exam and Pap test. This may be done every 3 years starting at age 21. Starting at age 30, this may be done every 5 years if you have a Pap test in combination with an HPV test. Talk with your health care provider about your test results, treatment options, and if necessary, the need for more tests.   Follow these instructions at home: Eating and drinking   Eat a diet that includes fresh fruits and vegetables, whole grains, lean protein, and low-fat dairy.  Take vitamin and mineral supplements as recommended by your health care provider.  Do not drink alcohol if: ? Your health care provider tells you not to drink. ? You are  pregnant, may be pregnant, or are planning to become pregnant.  If you drink alcohol: ? Limit how much you have to 0-1 drink a day. ? Be aware of how much alcohol is in your drink. In the [place] one drink equals one 12 oz bottle of beer (355 mL), one 5 oz glass of wine (148 mL), or one 1 oz glass of hard liquor (44 mL). Lifestyle  Take daily care of your teeth and gums.  Stay active. Exercise for at least 30 minutes on 5 or more days each week.  Do not use any products that contain nicotine or tobacco, such as cigarettes, e-cigarettes, and chewing tobacco. If you need help quitting, ask your health care provider.  If you are sexually active, practice safe sex. Use a condom or other form of birth control (contraception) in order to prevent pregnancy and STIs (sexually transmitted infections). If you plan to become pregnant, see your health care provider for a preconception visit. What's next?  Visit your health care provider once a year for a well check visit.  Ask your health care provider how often you should have your eyes and teeth checked.  Stay up to date on all vaccines. This information is not intended to replace advice given to you by your health care provider. Make sure you discuss any questions you have with your health care provider. Document Revised: 08/08/2018 Document Reviewed: 08/08/2018 Elsevier Patient Education  2020 [employer].

## 2020-10-21 ENCOUNTER — Telehealth: Payer: Self-pay

## 2020-10-21 LAB — HEMOGLOBIN A1C
Est. average glucose Bld gHb Est-mCnc: 114 mg/dL
Hgb A1c MFr Bld: 5.6 % (ref 4.8–5.6)

## 2020-10-21 LAB — HEPATITIS C ANTIBODY: Hep C Virus Ab: <0.1 s/co ratio (ref 0.0–0.9)

## 2020-10-21 LAB — CBC WITH DIFFERENTIAL/PLATELET
Basophils Absolute: 0.1 10*3/uL (ref 0.0–0.2)
Basos: 1 %
EOS (ABSOLUTE): 0.1 10*3/uL (ref 0.0–0.4)
Eos: 2 %
Hematocrit: 37.2 % (ref 34.0–46.6)
Hemoglobin: 11.9 g/dL (ref 11.1–15.9)
Immature Grans (Abs): 0 10*3/uL (ref 0.0–0.1)
Immature Granulocytes: 0 %
Lymphocytes Absolute: 1.7 10*3/uL (ref 0.7–3.1)
Lymphs: 27 %
MCH: 27.9 pg (ref 26.6–33.0)
MCHC: 32 g/dL (ref 31.5–35.7)
MCV: 87 fL (ref 79–97)
Monocytes Absolute: 0.4 10*3/uL (ref 0.1–0.9)
Monocytes: 6 %
Neutrophils Absolute: 4.1 10*3/uL (ref 1.4–7.0)
Neutrophils: 64 %
Platelets: 344 10*3/uL (ref 150–450)
RBC: 4.26 x10E6/uL (ref 3.77–5.28)
RDW: 14 % (ref 11.7–15.4)
WBC: 6.4 10*3/uL (ref 3.4–10.8)

## 2020-10-21 LAB — COMPREHENSIVE METABOLIC PANEL
ALT: 8 IU/L (ref 0–32)
AST: 12 IU/L (ref 0–40)
Albumin/Globulin Ratio: 1.5 (ref 1.2–2.2)
Albumin: 4 g/dL (ref 3.8–4.8)
Alkaline Phosphatase: 83 IU/L (ref 44–121)
BUN/Creatinine Ratio: 8 — ABNORMAL LOW (ref 9–23)
BUN: 8 mg/dL (ref 6–20)
Bilirubin Total: <0.2 mg/dL (ref 0.0–1.2)
CO2: 19 mmol/L — ABNORMAL LOW (ref 20–29)
Calcium: 9.5 mg/dL (ref 8.7–10.2)
Chloride: 104 mmol/L (ref 96–106)
Creatinine, Ser: 0.95 mg/dL (ref 0.57–1.00)
GFR calc Af Amer: 87 mL/min/{1.73_m2} (ref >59)
GFR calc non Af Amer: 76 mL/min/{1.73_m2} (ref >59)
Globulin, Total: 2.7 g/dL (ref 1.5–4.5)
Glucose: 87 mg/dL (ref 65–99)
Potassium: 4.8 mmol/L (ref 3.5–5.2)
Sodium: 137 mmol/L (ref 134–144)
Total Protein: 6.7 g/dL (ref 6.0–8.5)

## 2020-10-21 LAB — TSH: TSH: 1.72 u[IU]/mL (ref 0.450–4.500)

## 2020-10-21 LAB — LIPID PANEL
Chol/HDL Ratio: 3.2 ratio (ref 0.0–4.4)
Cholesterol, Total: 211 mg/dL — ABNORMAL HIGH (ref 100–199)
HDL: 66 mg/dL (ref >39)
LDL Chol Calc (NIH): 125 mg/dL — ABNORMAL HIGH (ref 0–99)
Triglycerides: 116 mg/dL (ref 0–149)
VLDL Cholesterol Cal: 20 mg/dL (ref 5–40)

## 2020-10-21 NOTE — Telephone Encounter (Signed)
-----   Message from Margaretann Loveless, [place] sent at 10/21/2020  8:36 AM EST ----- Blood count is normal. Kidney and liver function are normal. Sodium, potassium, and calcium are normal. Sugar/A1c is normal. Thyroid is normal. Cholesterol has increased from last years numbers. Currently readings are not high enough to require cholesterol lowering medication. Just continue working on your lifestyle modifications, limiting fatty foods, red meats, processed meats and sugars. Hepatitis C screen is negative.

## 2020-10-21 NOTE — Telephone Encounter (Signed)
Written by Margaretann Loveless, PA-C on 10/21/2020 8:36 AM EST View Full Comments Seen by patient Stacey Santiago on 10/21/2020 8:40 AM

## 2020-10-23 LAB — IGP, APTIMA HPV, RFX 16/18,45
HPV Aptima: NEGATIVE
PAP Smear Comment: 0

## 2020-10-23 LAB — IGP,APTIMA HPV,CTNG AGE GDLN

## 2021-06-30 ENCOUNTER — Encounter: Payer: Self-pay | Admitting: Family Medicine

## 2021-06-30 ENCOUNTER — Other Ambulatory Visit: Payer: Self-pay

## 2021-06-30 ENCOUNTER — Ambulatory Visit (INDEPENDENT_AMBULATORY_CARE_PROVIDER_SITE_OTHER): Payer: Managed Care, Other (non HMO) | Admitting: Family Medicine

## 2021-06-30 VITALS — BP 121/83 | HR 78 | Temp 98.4°F | Resp 16 | Ht 65.0 in | Wt 227.4 lb

## 2021-06-30 DIAGNOSIS — Z1231 Encounter for screening mammogram for malignant neoplasm of breast: Secondary | ICD-10-CM | POA: Diagnosis not present

## 2021-06-30 DIAGNOSIS — Z0000 Encounter for general adult medical examination without abnormal findings: Secondary | ICD-10-CM

## 2021-06-30 DIAGNOSIS — E669 Obesity, unspecified: Secondary | ICD-10-CM | POA: Diagnosis not present

## 2021-06-30 DIAGNOSIS — F4329 Adjustment disorder with other symptoms: Secondary | ICD-10-CM | POA: Diagnosis not present

## 2021-06-30 DIAGNOSIS — Z6837 Body mass index (BMI) 37.0-37.9, adult: Secondary | ICD-10-CM

## 2021-06-30 NOTE — Progress Notes (Signed)
Complete physical exam   Patient: Stacey Santiago   DOB: 11/14/1981   40 y.o. Female  MRN: [id number] Visit Date: 06/30/2021  Today's healthcare provider: Shirlee Latch, MD   Chief Complaint  Patient presents with   Annual Exam   Subjective        HPI  Stacey Santiago is a 40 y.o. female who presents today for a complete physical exam.  She reports consuming a general diet. The patient does not participate in regular exercise at present. She generally feels well. She reports sleeping fairly well. She does not have additional problems to discuss today.   Mood/Anxiety She takes the 10 mg lexapro as needed however she feels stable without taking it.   Vaccines  She is up-to-date on tetanus and is fully vaccinated with COVID.   Screenings  10/20/20 Pap/HPV negative She is amenable to scheduling a mammogram.   Past Medical History:  Diagnosis Date   Anxiety    Depression    GERD (gastroesophageal reflux disease)    Past Surgical History:  Procedure Laterality Date   LAPAROSCOPIC APPENDECTOMY N/A 06/09/2016   Procedure: APPENDECTOMY LAPAROSCOPIC;  Surgeon: Ricarda Frame, MD;  Location: [facility] ORS;  Service: General;  Laterality: N/A;   WISDOM TOOTH EXTRACTION  19 yrs. of age   Social History   Socioeconomic History   Marital status: Married    Spouse name: Ramon Dredge   Number of children: 2   Years of education: Not on file   Highest education level: Not on file  Occupational History   Not on file  Tobacco Use   Smoking status: Never   Smokeless tobacco: Never  Substance and Sexual Activity   Alcohol use: Yes    Comment: social   Drug use: No   Sexual activity: Yes    Partners: Male  Other Topics Concern   Not on file  Social History Narrative   Not on file   Social Determinants of Health   Financial Resource Strain: Not on file  Food Insecurity: Not on file  Transportation Needs: Not on file  Physical Activity: Not on file  Stress: Not on file  Social  Connections: Not on file  Intimate Partner Violence: Not on file   Family Status  Relation Name Status   Mother  Alive   Father  Alive   Brother  Alive   MGF  (Not Specified)   Family History  Problem Relation Age of Onset   Diabetes Mother    Hypertension Mother    Hyperlipidemia Mother    Hypertension Father    Hyperlipidemia Father    Diabetes Maternal Grandfather    No Known Allergies  Patient Care Team: Margaretann Loveless, PA-C as PCP - General (Family Medicine)   Medications: Outpatient Medications Prior to Visit  Medication Sig   desogestrel-ethinyl estradiol (APRI) 0.15-30 MG-MCG tablet Take 1 tablet by mouth daily.   escitalopram (LEXAPRO) 10 MG tablet Take 1 tablet (10 mg total) by mouth daily.   No facility-administered medications prior to visit.    Review of Systems  Constitutional:  Negative for chills, fatigue and fever.  HENT:  Negative for ear pain, rhinorrhea, sinus pain and sore throat.   Eyes:  Negative for pain and visual disturbance.  Respiratory:  Negative for cough, chest tightness, shortness of breath and wheezing.   Cardiovascular:  Negative for chest pain, palpitations and leg swelling.  Gastrointestinal:  Negative for abdominal pain, blood in stool, constipation, diarrhea, nausea and vomiting.  Genitourinary:  Negative for flank pain, frequency, pelvic pain and urgency.  Musculoskeletal:  Negative for back pain, myalgias and neck pain.  Neurological:  Negative for dizziness, seizures, syncope, weakness, light-headedness, numbness and headaches.  All other systems reviewed and are negative.  Last CBC Lab Results  Component Value Date   WBC 6.4 10/20/2020   HGB 11.9 10/20/2020   HCT 37.2 10/20/2020   MCV 87 10/20/2020   MCH 27.9 10/20/2020   RDW 14.0 10/20/2020   PLT 344 10/20/2020   Last metabolic panel Lab Results  Component Value Date   GLUCOSE 87 10/20/2020   NA 137 10/20/2020   K 4.8 10/20/2020   CL 104 10/20/2020   CO2 19  (L) 10/20/2020   BUN 8 10/20/2020   CREATININE 0.95 10/20/2020   GFRNONAA 76 10/20/2020   GFRAA 87 10/20/2020   CALCIUM 9.5 10/20/2020   PROT 6.7 10/20/2020   ALBUMIN 4.0 10/20/2020   LABGLOB 2.7 10/20/2020   AGRATIO 1.5 10/20/2020   BILITOT <0.2 10/20/2020   ALKPHOS 83 10/20/2020   AST 12 10/20/2020   ALT 8 10/20/2020   ANIONGAP 6 06/10/2016   Last lipids Lab Results  Component Value Date   CHOL 211 (H) 10/20/2020   HDL 66 10/20/2020   LDLCALC 125 (H) 10/20/2020   TRIG 116 10/20/2020   CHOLHDL 3.2 10/20/2020   Last hemoglobin A1c Lab Results  Component Value Date   HGBA1C 5.6 10/20/2020   Last thyroid functions Lab Results  Component Value Date   TSH 1.720 10/20/2020      Objective    BP 121/83   Pulse 78   Temp 98.4 F (36.9 C) (Oral)   Resp 16   Ht 5\' 5"  (1.651 m)   Wt 227 lb 6.4 oz (103.1 kg)   LMP 06/30/2020   SpO2 99%   BMI 37.84 kg/m  BP Readings from Last 3 Encounters:  06/30/21 121/83  10/20/20 101/68  09/13/20 110/82   Wt Readings from Last 3 Encounters:  06/30/21 227 lb 6.4 oz (103.1 kg)  10/20/20 224 lb 9.6 oz (101.9 kg)  09/13/20 222 lb 12.8 oz (101.1 kg)    Physical Exam Vitals reviewed.  Constitutional:      General: She is not in acute distress.    Appearance: Normal appearance. She is well-developed. She is not diaphoretic.  HENT:     Head: Normocephalic and atraumatic.     Right Ear: Hearing, tympanic membrane, ear canal and external ear normal.     Left Ear: Hearing, tympanic membrane, ear canal and external ear normal.     Nose: Nose normal.     Mouth/Throat:     Mouth: Mucous membranes are moist.     Pharynx: Oropharynx is clear. No oropharyngeal exudate.  Eyes:     General: No scleral icterus.    Extraocular Movements: Extraocular movements intact.     Conjunctiva/sclera: Conjunctivae normal.     Pupils: Pupils are equal, round, and reactive to light.  Neck:     Thyroid: No thyromegaly.  Cardiovascular:     Rate  and Rhythm: Normal rate and regular rhythm.     Pulses: Normal pulses.     Heart sounds: Normal heart sounds. No murmur heard. Pulmonary:     Effort: Pulmonary effort is normal. No respiratory distress.     Breath sounds: Normal breath sounds. No wheezing or rales.  Abdominal:     General: There is no distension.     Palpations: Abdomen is soft.  Tenderness: There is no abdominal tenderness.  Musculoskeletal:        General: No deformity.     Cervical back: Neck supple.     Right lower leg: No edema.     Left lower leg: No edema.  Lymphadenopathy:     Cervical: No cervical adenopathy.  Skin:    General: Skin is warm and dry.     Findings: No rash.  Neurological:     Mental Status: She is alert and oriented to person, place, and time. Mental status is at baseline.     Sensory: No sensory deficit.     Motor: No weakness.     Gait: Gait normal.  Psychiatric:        Mood and Affect: Mood normal.        Behavior: Behavior normal.        Thought Content: Thought content normal.     Last depression screening scores PHQ 2/9 Scores 06/30/2021 10/20/2020 09/13/2020  PHQ - 2 Score 1 0 0  PHQ- 9 Score 2 - -   Last fall risk screening Fall Risk  06/30/2021  Falls in the past year? -  Number falls in past yr: 0  Injury with Fall? 0  Risk for fall due to : -  Follow up -   Last Audit-C alcohol use screening Alcohol Use Disorder Test (AUDIT) 06/30/2021  1. How often do you have a drink containing alcohol? 2  2. How many drinks containing alcohol do you have on a typical day when you are drinking? 0  3. How often do you have six or more drinks on one occasion? 0  AUDIT-C Score 2  Alcohol Brief Interventions/Follow-up -   A score of 3 or more in women, and 4 or more in men indicates increased risk for alcohol abuse, EXCEPT if all of the points are from question 1   No results found for any visits on 06/30/21.  Assessment & Plan     Problem List Items Addressed This Visit        Other   Adjustment reaction    chronic and well controlled Continue lexapro Encouraged taking it daily        Class 2 obesity without serious comorbidity with body mass index (BMI) of 37.0 to 37.9 in adult    Discussed importance of healthy weight management Discussed diet and exercise        Relevant Orders   CBC w/Diff/Platelet   VITAMIN D 25 Hydroxy (Vit-D Deficiency, Fractures)   Other Visit Diagnoses     Annual physical exam    -  Primary   Relevant Orders   Comprehensive metabolic panel   Lipid Panel With LDL/HDL Ratio   TSH   CBC w/Diff/Platelet   VITAMIN D 25 Hydroxy (Vit-D Deficiency, Fractures)   Encounter for screening mammogram for malignant neoplasm of breast       Relevant Orders   MM DIGITAL SCREENING BILATERAL       Routine Health Maintenance and Physical Exam  Exercise Activities and Dietary recommendations  Goals   None     Immunization History  Administered Date(s) Administered   DTaP 12/11/1998   Influenza Whole 10/11/2012   Influenza,inj,Quad PF,6+ Mos 10/01/2013, 10/04/2016, 09/10/2017, 09/12/2018, 09/23/2019, 09/13/2020   PFIZER(Purple Top)SARS-COV-2 Vaccination 03/04/2020, 03/25/2020   Td 03/27/2012   Tdap 12/25/2013    Health Maintenance  Topic Date Due   COVID-19 Vaccine (3 - Booster for Pfizer series) 08/25/2020   INFLUENZA VACCINE  07/11/2021  TETANUS/TDAP  12/26/2023   PAP SMEAR-Modifier  10/20/2025   Hepatitis C Screening  Completed   HIV Screening  Completed   Pneumococcal Vaccine 550-40 Years old  Aged Out   HPV VACCINES  Aged Out    Discussed health benefits of physical activity, and encouraged her to engage in regular exercise appropriate for her age and condition.  Return in about 6 months (around 12/31/2021) for chronic disease f/u.     I,Essence Turner,acting as a Neurosurgeonscribe for Shirlee LatchAngela Brittanie Dosanjh, MD.,have documented all relevant documentation on the behalf of Shirlee LatchAngela Artemis Loyal, MD,as directed by  Shirlee LatchAngela  Jamicheal Heard, MD while in the presence of Shirlee LatchAngela Toma Erichsen, MD.  I, Shirlee LatchAngela Denetra Formoso, MD, have reviewed all documentation for this visit. The documentation on 06/30/21 for the exam, diagnosis, procedures, and orders are all accurate and complete.   Anice Wilshire, Marzella SchleinAngela M, MD, MPH [facility]

## 2021-06-30 NOTE — Patient Instructions (Signed)
Preventive Care 40-40 Years Old, Female Preventive care refers to lifestyle choices and visits with your health care provider that can promote health and wellness. This includes: A yearly physical exam. This is also called an annual wellness visit. Regular dental and eye exams. Immunizations. Screening for certain conditions. Healthy lifestyle choices, such as: Eating a healthy diet. Getting regular exercise. Not using drugs or products that contain nicotine and tobacco. Limiting alcohol use. What can I expect for my preventive care visit? Physical exam Your health care provider will check your: Height and weight. These may be used to calculate your BMI (body mass index). BMI is a measurement that tells if you are at a healthy weight. Heart rate and blood pressure. Body temperature. Skin for abnormal spots. Counseling Your health care provider may ask you questions about your: Past medical problems. Family's medical history. Alcohol, tobacco, and drug use. Emotional well-being. Home life and relationship well-being. Sexual activity. Diet, exercise, and sleep habits. Work and work Statistician. Access to firearms. Method of birth control. Menstrual cycle. Pregnancy history. What immunizations do I need?  Vaccines are usually given at various ages, according to a schedule. Your health care provider will recommend vaccines for you based on your age, medicalhistory, and lifestyle or other factors, such as travel or where you work. What tests do I need? Blood tests Lipid and cholesterol levels. These may be checked every 5 years, or more often if you are over 40 years old. Hepatitis C test. Hepatitis B test. Screening Lung cancer screening. You may have this screening every year starting at age 400 if you have a 30-pack-year history of smoking and currently smoke or have quit within the past 15 years. Colorectal cancer screening. All adults should have this screening starting at  age 23 and continuing until age 3. Your health care provider may recommend screening at age 40 if you are at increased risk. You will have tests every 1-10 years, depending on your results and the type of screening test. Diabetes screening. This is done by checking your blood sugar (glucose) after you have not eaten for a while (fasting). You may have this done every 1-3 years. Mammogram. This may be done every 1-2 years. Talk with your health care provider about when you should start having regular mammograms. This may depend on whether you have a family history of breast cancer. BRCA-related cancer screening. This may be done if you have a family history of breast, ovarian, tubal, or peritoneal cancers. Pelvic exam and Pap test. This may be done every 3 years starting at age 40. Starting at age 40, this may be done every 5 years if you have a Pap test in combination with an HPV test. Other tests STD (sexually transmitted disease) testing, if you are at risk. Bone density scan. This is done to screen for osteoporosis. You may have this scan if you are at high risk for osteoporosis. Talk with your health care provider about your test results, treatment options,and if necessary, the need for more tests. Follow these instructions at home: Eating and drinking  Eat a diet that includes fresh fruits and vegetables, whole grains, lean protein, and low-fat dairy products. Take vitamin and mineral supplements as recommended by your health care provider. Do not drink alcohol if: Your health care provider tells you not to drink. You are pregnant, may be pregnant, or are planning to become pregnant. If you drink alcohol: Limit how much you have to 0-1 drink a day. Be aware  of how much alcohol is in your drink. In the [place] one drink equals one 12 oz bottle of beer (355 mL), one 5 oz glass of wine (148 mL), or one 1 oz glass of hard liquor (44 mL).  Lifestyle Take daily care of your teeth and  gums. Brush your teeth every morning and night with fluoride toothpaste. Floss one time each day. Stay active. Exercise for at least 30 minutes 5 or more days each week. Do not use any products that contain nicotine or tobacco, such as cigarettes, e-cigarettes, and chewing tobacco. If you need help quitting, ask your health care provider. Do not use drugs. If you are sexually active, practice safe sex. Use a condom or other form of protection to prevent STIs (sexually transmitted infections). If you do not wish to become pregnant, use a form of birth control. If you plan to become pregnant, see your health care provider for a prepregnancy visit. If told by your health care provider, take low-dose aspirin daily starting at age 40. Find healthy ways to cope with stress, such as: Meditation, yoga, or listening to music. Journaling. Talking to a trusted person. Spending time with friends and family. Safety Always wear your seat belt while driving or riding in a vehicle. Do not drive: If you have been drinking alcohol. Do not ride with someone who has been drinking. When you are tired or distracted. While texting. Wear a helmet and other protective equipment during sports activities. If you have firearms in your house, make sure you follow all gun safety procedures. What's next? Visit your health care provider once a year for an annual wellness visit. Ask your health care provider how often you should have your eyes and teeth checked. Stay up to date on all vaccines. This information is not intended to replace advice given to you by your health care provider. Make sure you discuss any questions you have with your healthcare provider. Document Revised: 08/31/2020 Document Reviewed: 08/08/2018 Elsevier Patient Education  2022 [employer].

## 2021-06-30 NOTE — Assessment & Plan Note (Signed)
Discussed importance of healthy weight management Discussed diet and exercise  

## 2021-06-30 NOTE — Assessment & Plan Note (Signed)
chronic and well controlled Continue lexapro Encouraged taking it daily

## 2021-07-01 LAB — COMPREHENSIVE METABOLIC PANEL
ALT: 11 IU/L (ref 0–32)
AST: 15 IU/L (ref 0–40)
Albumin/Globulin Ratio: 1.7 (ref 1.2–2.2)
Albumin: 4.3 g/dL (ref 3.8–4.8)
Alkaline Phosphatase: 74 IU/L (ref 44–121)
BUN/Creatinine Ratio: 9 (ref 9–23)
BUN: 9 mg/dL (ref 6–24)
Bilirubin Total: 0.2 mg/dL (ref 0.0–1.2)
CO2: 19 mmol/L — ABNORMAL LOW (ref 20–29)
Calcium: 9.6 mg/dL (ref 8.7–10.2)
Chloride: 107 mmol/L — ABNORMAL HIGH (ref 96–106)
Creatinine, Ser: 0.96 mg/dL (ref 0.57–1.00)
Globulin, Total: 2.6 g/dL (ref 1.5–4.5)
Glucose: 81 mg/dL (ref 65–99)
Potassium: 5 mmol/L (ref 3.5–5.2)
Sodium: 141 mmol/L (ref 134–144)
Total Protein: 6.9 g/dL (ref 6.0–8.5)
eGFR: 77 mL/min/{1.73_m2} (ref >59)

## 2021-07-01 LAB — LIPID PANEL WITH LDL/HDL RATIO
Cholesterol, Total: 227 mg/dL — ABNORMAL HIGH (ref 100–199)
HDL: 69 mg/dL (ref >39)
LDL Chol Calc (NIH): 143 mg/dL — ABNORMAL HIGH (ref 0–99)
LDL/HDL Ratio: 2.1 ratio (ref 0.0–3.2)
Triglycerides: 84 mg/dL (ref 0–149)
VLDL Cholesterol Cal: 15 mg/dL (ref 5–40)

## 2021-07-01 LAB — CBC WITH DIFFERENTIAL/PLATELET
Basophils Absolute: 0.1 10*3/uL (ref 0.0–0.2)
Basos: 1 %
EOS (ABSOLUTE): 0.1 10*3/uL (ref 0.0–0.4)
Eos: 2 %
Hematocrit: 36.7 % (ref 34.0–46.6)
Hemoglobin: 12.3 g/dL (ref 11.1–15.9)
Immature Grans (Abs): 0 10*3/uL (ref 0.0–0.1)
Immature Granulocytes: 0 %
Lymphocytes Absolute: 2.3 10*3/uL (ref 0.7–3.1)
Lymphs: 32 %
MCH: 29.1 pg (ref 26.6–33.0)
MCHC: 33.5 g/dL (ref 31.5–35.7)
MCV: 87 fL (ref 79–97)
Monocytes Absolute: 0.4 10*3/uL (ref 0.1–0.9)
Monocytes: 6 %
Neutrophils Absolute: 4.2 10*3/uL (ref 1.4–7.0)
Neutrophils: 59 %
Platelets: 334 10*3/uL (ref 150–450)
RBC: 4.23 x10E6/uL (ref 3.77–5.28)
RDW: 13.2 % (ref 11.7–15.4)
WBC: 7.1 10*3/uL (ref 3.4–10.8)

## 2021-07-01 LAB — VITAMIN D 25 HYDROXY (VIT D DEFICIENCY, FRACTURES): Vit D, 25-Hydroxy: 57.3 ng/mL (ref 30.0–100.0)

## 2021-07-01 LAB — TSH: TSH: 1.23 u[IU]/mL (ref 0.450–4.500)

## 2021-07-02 ENCOUNTER — Other Ambulatory Visit: Payer: Self-pay | Admitting: Physician Assistant

## 2021-07-02 DIAGNOSIS — Z3009 Encounter for other general counseling and advice on contraception: Secondary | ICD-10-CM

## 2021-09-02 ENCOUNTER — Ambulatory Visit
Admission: RE | Admit: 2021-09-02 | Discharge: 2021-09-02 | Disposition: A | Discharge status: Home / Self Care | Payer: BC Managed Care – PPO | Source: Ambulatory Visit | Attending: Family Medicine | Admitting: Family Medicine

## 2021-09-02 ENCOUNTER — Other Ambulatory Visit: Payer: Self-pay

## 2021-09-02 DIAGNOSIS — Z1231 Encounter for screening mammogram for malignant neoplasm of breast: Secondary | ICD-10-CM | POA: Insufficient documentation

## 2021-09-12 ENCOUNTER — Other Ambulatory Visit: Payer: Self-pay | Admitting: Family Medicine

## 2021-09-12 DIAGNOSIS — N6320 Unspecified lump in the left breast, unspecified quadrant: Secondary | ICD-10-CM

## 2021-09-12 DIAGNOSIS — R928 Other abnormal and inconclusive findings on diagnostic imaging of breast: Secondary | ICD-10-CM

## 2021-09-16 ENCOUNTER — Other Ambulatory Visit: Payer: Self-pay

## 2021-09-16 ENCOUNTER — Ambulatory Visit
Admission: RE | Admit: 2021-09-16 | Discharge: 2021-09-16 | Disposition: A | Discharge status: Home / Self Care | Payer: Managed Care, Other (non HMO) | Source: Ambulatory Visit | Attending: Family Medicine | Admitting: Family Medicine

## 2021-09-16 DIAGNOSIS — N6320 Unspecified lump in the left breast, unspecified quadrant: Secondary | ICD-10-CM | POA: Insufficient documentation

## 2021-09-16 DIAGNOSIS — R928 Other abnormal and inconclusive findings on diagnostic imaging of breast: Secondary | ICD-10-CM

## 2021-10-24 ENCOUNTER — Encounter: Payer: Self-pay | Admitting: Physician Assistant

## 2021-12-13 ENCOUNTER — Other Ambulatory Visit: Payer: Self-pay | Admitting: Physician Assistant

## 2021-12-13 DIAGNOSIS — F4322 Adjustment disorder with anxiety: Secondary | ICD-10-CM

## 2022-03-06 ENCOUNTER — Ambulatory Visit
Admission: EM | Admit: 2022-03-06 | Discharge: 2022-03-06 | Disposition: A | Discharge status: Home / Self Care | Payer: Managed Care, Other (non HMO) | Attending: Family Medicine | Admitting: Family Medicine

## 2022-03-06 ENCOUNTER — Other Ambulatory Visit: Payer: Self-pay

## 2022-03-06 ENCOUNTER — Encounter: Payer: Self-pay | Admitting: Emergency Medicine

## 2022-03-06 DIAGNOSIS — J029 Acute pharyngitis, unspecified: Secondary | ICD-10-CM | POA: Diagnosis not present

## 2022-03-06 LAB — POCT RAPID STREP A (OFFICE): Rapid Strep A Screen: NEGATIVE

## 2022-03-06 LAB — POCT MONO SCREEN (KUC): Mono, POC: NEGATIVE

## 2022-03-06 MED ORDER — PANTOPRAZOLE SODIUM 20 MG PO TBEC: 20.0000 mg | DELAYED_RELEASE_TABLET | Freq: Every day | ORAL | 1 refills | Status: DC

## 2022-03-06 NOTE — ED Provider Notes (Signed)
?  Stacey Santiago ? ? ?[id number] ?03/06/22 Arrival Time: 1001 ? ?ASSESSMENT & PLAN: ? ?1. Sore throat   ? ?Rapid strep negative. Monospot negative. ?Discussed possible GERD etiology. ? ?Begin trial of: ?New Prescriptions  ? PANTOPRAZOLE (PROTONIX) 20 MG TABLET    Take 1 tablet (20 mg total) by mouth daily.  ? ?Recommend: ? Follow-up Information   ? ? [facility].   ?Why: If worsening or failing to improve as anticipated. ?Contact information: ?[address] ?[place] [address] ?[phone] ? ? ?  ?  ? ?  ?  ? ?  ? ? ?Reviewed expectations re: course of current medical issues. Questions answered. ?Outlined signs and symptoms indicating need for more acute intervention. ?Understanding verbalized. ?After Visit Summary given. ? ? ?SUBJECTIVE: ?History from: Patient. ?Stacey Santiago is a 41 y.o. female. Reports: on/off ST; alternating sides; over past month. No fever. No recent illnesses. No post-nasal drainage. Tolerating PO at all times. H/O GERD without recent symptoms; no current tx. Denies: headache. Normal PO intake without n/v/d. ? ?OBJECTIVE: ? ?Vitals:  ? 03/06/22 1018  ?BP: 126/89  ?Pulse: 94  ?Resp: 20  ?Temp: 99.6 ?F (37.6 ?C)  ?SpO2: 98%  ?  ?General appearance: alert; no distress ?Eyes: PERRLA; EOMI; conjunctiva normal ?HENT: [place]; AT; without nasal congestion; throat with very slight irritation otherwise unremarkable ?Neck: supple without LAD ?Lungs: speaks full sentences without difficulty; unlabored ?Extremities: no edema ?Skin: warm and dry ?Neurologic: normal gait ?Psychological: alert and cooperative; normal mood and affect ? ?Labs: ?Results for orders placed or performed during the hospital encounter of 03/06/22  ?POCT rapid strep A  ?Result Value Ref Range  ? Rapid Strep A Screen Negative Negative  ? ?Labs Reviewed  ?POCT RAPID STREP A (OFFICE) - Normal  ?POCT MONO SCREEN ([facility])  ? ?No Known Allergies ? ?Past Medical History:  ?Diagnosis Date  ? Anxiety    ? Depression   ? GERD (gastroesophageal reflux disease)   ? ?Social History  ? ?Socioeconomic History  ? Marital status: Married  ?  Spouse name: Stacey Santiago  ? Number of children: 2  ? Years of education: Not on file  ? Highest education level: Not on file  ?Occupational History  ? Not on file  ?Tobacco Use  ? Smoking status: Never  ? Smokeless tobacco: Never  ?Substance and Sexual Activity  ? Alcohol use: Yes  ?  Comment: social  ? Drug use: No  ? Sexual activity: Yes  ?  Partners: Male  ?Other Topics Concern  ? Not on file  ?Social History Narrative  ? Not on file  ? ?Social Determinants of Health  ? ?Financial Resource Strain: Not on file  ?Food Insecurity: Not on file  ?Transportation Needs: Not on file  ?Physical Activity: Not on file  ?Stress: Not on file  ?Social Connections: Not on file  ?Intimate Partner Violence: Not on file  ? ?Family History  ?Problem Relation Age of Onset  ? Diabetes Mother   ? Hypertension Mother   ? Hyperlipidemia Mother   ? Hypertension Father   ? Hyperlipidemia Father   ? Diabetes Maternal Grandfather   ? ?Past Surgical History:  ?Procedure Laterality Date  ? LAPAROSCOPIC APPENDECTOMY N/A 06/09/2016  ? Procedure: APPENDECTOMY LAPAROSCOPIC;  Surgeon: Clayburn Pert, MD;  Location: [facility] ORS;  Service: General;  Laterality: N/A;  ? WISDOM TOOTH EXTRACTION  19 yrs. of age  ? ?  ?Vanessa Kick, MD ?03/06/22 1106 ? ?

## 2022-03-06 NOTE — ED Triage Notes (Signed)
Pt here with sore throat on and off for about a month. The pain is stated to move from side to side and today her left side is more painful. Tylenol and Ibuprofen are unsuccessful in treating pain.  ?

## 2022-03-09 ENCOUNTER — Other Ambulatory Visit: Payer: Self-pay | Admitting: Family Medicine

## 2022-03-09 DIAGNOSIS — F4322 Adjustment disorder with anxiety: Secondary | ICD-10-CM

## 2022-03-16 ENCOUNTER — Telehealth: Payer: Self-pay | Admitting: Family Medicine

## 2022-03-16 DIAGNOSIS — N6320 Unspecified lump in the left breast, unspecified quadrant: Secondary | ICD-10-CM

## 2022-03-16 DIAGNOSIS — R928 Other abnormal and inconclusive findings on diagnostic imaging of breast: Secondary | ICD-10-CM

## 2022-03-16 NOTE — Telephone Encounter (Signed)
This is a former patient of [facility] that had an abnormal mammogram in October. She is supposed to have a left breast ultrasound for follow up this month, but I don't see that it has been scheduled. Please remind patient it is time for follow up and place orders.  ?

## 2022-03-20 NOTE — Telephone Encounter (Signed)
I called and advised patient of message below. Patient states that she received a letter in the mail from [facility] about needing to schedule the appointment for follow up ultrasound. Patient states she is going to call today to schedule her appointment. Patient prefers to call so that the appointment can be made to fit her schedule. Order placed.  ?

## 2022-03-24 ENCOUNTER — Ambulatory Visit
Admission: RE | Admit: 2022-03-24 | Discharge: 2022-03-24 | Disposition: A | Discharge status: Home / Self Care | Payer: Managed Care, Other (non HMO) | Source: Ambulatory Visit | Attending: Family Medicine | Admitting: Family Medicine

## 2022-03-24 DIAGNOSIS — R928 Other abnormal and inconclusive findings on diagnostic imaging of breast: Secondary | ICD-10-CM | POA: Diagnosis present

## 2022-03-24 DIAGNOSIS — N6320 Unspecified lump in the left breast, unspecified quadrant: Secondary | ICD-10-CM | POA: Insufficient documentation

## 2022-04-20 ENCOUNTER — Ambulatory Visit: Payer: Managed Care, Other (non HMO) | Admitting: Family

## 2022-04-20 ENCOUNTER — Encounter: Payer: Self-pay | Admitting: Family

## 2022-04-20 VITALS — BP 112/68 | HR 74 | Temp 99.3°F | Resp 16 | Ht 65.0 in | Wt 233.4 lb

## 2022-04-20 DIAGNOSIS — E785 Hyperlipidemia, unspecified: Secondary | ICD-10-CM | POA: Diagnosis not present

## 2022-04-20 DIAGNOSIS — E66812 Obesity, class 2: Secondary | ICD-10-CM

## 2022-04-20 DIAGNOSIS — F411 Generalized anxiety disorder: Secondary | ICD-10-CM | POA: Diagnosis not present

## 2022-04-20 DIAGNOSIS — E669 Obesity, unspecified: Secondary | ICD-10-CM | POA: Diagnosis not present

## 2022-04-20 DIAGNOSIS — E559 Vitamin D deficiency, unspecified: Secondary | ICD-10-CM

## 2022-04-20 DIAGNOSIS — B360 Pityriasis versicolor: Secondary | ICD-10-CM | POA: Diagnosis not present

## 2022-04-20 DIAGNOSIS — N6320 Unspecified lump in the left breast, unspecified quadrant: Secondary | ICD-10-CM

## 2022-04-20 DIAGNOSIS — Z6837 Body mass index (BMI) 37.0-37.9, adult: Secondary | ICD-10-CM

## 2022-04-20 DIAGNOSIS — E6609 Other obesity due to excess calories: Secondary | ICD-10-CM

## 2022-04-20 MED ORDER — KETOCONAZOLE 2 % EX CREA: 1.0000 "application " | TOPICAL_CREAM | Freq: Every day | CUTANEOUS | 0 refills | Status: AC

## 2022-04-20 NOTE — Assessment & Plan Note (Signed)
Work on diet and exercise as tolerated  ?

## 2022-04-20 NOTE — Assessment & Plan Note (Signed)
Ordered placed for repeat in oct 2023 for bil dx mammo and left breast u/s ?

## 2022-04-20 NOTE — Assessment & Plan Note (Signed)
Ordered lipid panel, pending results. Work on low cholesterol diet and exercise as tolerated ? ?

## 2022-04-20 NOTE — Patient Instructions (Addendum)
Call [facility]/[place] [facility] to schedule your bilateral dx mammogram and left breast u/s (due in October) as I have sent the electronic order to their facility.  ?Here is their number : [phone]  ? ?Welcome to our clinic, I am happy to have you as my new patient. I am excited to continue on this healthcare journey with you. ? ?I have created an order for lab work today during our visit.  ?Please schedule an appointment on your way out to return to the lab at your convenience. Please return fasting at your lab appointment (meaning you can only drink black coffee and or water prior to your appointment). I will reach out to you in regards to the labs when I receive the results.  ? ?Please keep in mind ?Any my chart messages you send have p to a three business day turnaround for a response.  ?Phone calls may have up to a one day business turnaround for a  response.  ? ?If you need a medication refill I recommend you request it through the pharmacy as this is easiest for [place] rather than sending a message and or phone call.  ? ?Due to recent changes in healthcare laws, you may see results of your imaging and/or laboratory studies on MyChart before I have had a chance to review them.  I understand that in some cases there may be results that are confusing or concerning to you. Please understand that not all results are received at the same time and often I may need to interpret multiple results in order to provide you with the best plan of care or course of treatment. Therefore, I ask that you please give me 2 business days to thoroughly review all your results before contacting my office for clarification. Should we see a critical lab result, you will be contacted sooner.  ? ?It was a pleasure seeing you today! Please do not hesitate to reach out with any questions and or concerns. ? ?Regards,  ? ?Tedd Cottrill ?FNP-C ? ? ?

## 2022-04-20 NOTE — Progress Notes (Signed)
? ?New Patient Office Visit ? ?Subjective:  ?Patient ID: Stacey Santiago, female    DOB: [birth year]  Age: 41 y.o. MRN: [id number] ? ?CC:  ?Chief Complaint  ?Patient presents with  ? Establish Care  ? ? ?HPI ?Stacey Santiago is here to establish care as a new patient. ? ?Prior provider was: Fenton Malling, [place]  ?Pt is without acute concerns.  ? ?Pap: 04/29/2017: hpv negative  ? ?chronic concerns: ? ?Acid reflux: had sore throat, negative workup. Suggestion of acid reflux, was given protonix but then was too expensive to get filled. Took otc prilosec, but then not taking anything and was improved.  ?At this point resolved.  ? ?GAD: lexapro 10 mg, however not taking daily. Started taking for generalized anxiety, not really any depression. Son with a lot of learning issues. Daughter does really well in school however in sixth grade and getting a little attitude. Thoughts that maybe she should start taking more regularly. Does have decreased libido with this.  ? Tries to exercise just hard to get motivated.  ? Over the last one year, lost job at [employer] and got a new job working from home.  ? ?On birth control: apri birth control. Trying to keep from getting pregnant with her husband.  ?Menses regular, monthly.  ? ?Frustrated with weight and obesity. Does like to eat, tries to exercise but hard to get motivated.  ? ?Past Medical History:  ?Diagnosis Date  ? Anxiety   ? Depression   ? GERD (gastroesophageal reflux disease)   ? ? ?Past Surgical History:  ?Procedure Laterality Date  ? LAPAROSCOPIC APPENDECTOMY N/A 06/09/2016  ? Procedure: APPENDECTOMY LAPAROSCOPIC;  Surgeon: Clayburn Pert, MD;  Location: [facility] ORS;  Service: General;  Laterality: N/A;  ? WISDOM TOOTH EXTRACTION  19 yrs. of age  ? ? ?Family History  ?Problem Relation Age of Onset  ? Diabetes Mother   ? Hypertension Mother   ? Hyperlipidemia Mother   ? Hypertension Father   ? Hyperlipidemia Father   ? Heart attack Maternal Grandmother   ?     40  ? Diabetes Maternal  Grandfather   ? Heart attack Maternal Grandfather   ?     60's  ? ? ?Social History  ? ?Socioeconomic History  ? Marital status: Married  ?  Spouse name: Percell Miller  ? Number of children: 2  ? Years of education: Not on file  ? Highest education level: Not on file  ?Occupational History  ? Occupation: Magazine features editor  ?  Comment: [employer]  ? Occupation: Optometrist  ?  Comment: [employer]  ?Tobacco Use  ? Smoking status: Never  ? Smokeless tobacco: Never  ?Vaping Use  ? Vaping Use: Never used  ?Substance and Sexual Activity  ? Alcohol use: Yes  ?  Comment: social  ? Drug use: No  ? Sexual activity: Yes  ?  Partners: Male  ?  Birth control/protection: Pill, OCP  ?Other Topics Concern  ? Not on file  ?Social History Narrative  ? Working at [employer], Financial risk analyst , working from home.   ? Also working with carnival  ? ?Social Determinants of Health  ? ?Financial Resource Strain: Not on file  ?Food Insecurity: Not on file  ?Transportation Needs: Not on file  ?Physical Activity: Not on file  ?Stress: Not on file  ?Social Connections: Not on file  ?Intimate Partner Violence: Not on file  ? ? ?Outpatient Medications Prior to Visit  ?Medication Sig Dispense Refill  ?  APRI 0.15-30 MG-MCG tablet TAKE 1 TABLET BY MOUTH  DAILY 84 tablet 3  ? escitalopram (LEXAPRO) 10 MG tablet Take 1 tablet (10 mg total) by mouth daily. Please schedule office visit before any future refill. 90 tablet 0  ? pantoprazole (PROTONIX) 20 MG tablet Take 1 tablet (20 mg total) by mouth daily. 30 tablet 1  ? ?No facility-administered medications prior to visit.  ? ? ?No Known Allergies ? ?ROS ?Review of Systems  ?Constitutional:  Negative for chills and fatigue.  ?Respiratory:  Negative for cough and shortness of breath.   ?Cardiovascular:  Negative for chest pain and leg swelling.  ?Gastrointestinal:  Negative for diarrhea and nausea.  ?Genitourinary:  Negative for difficulty urinating, menstrual problem and vaginal bleeding.  ?Skin:   Positive for rash (chronic, ongoing. on chest and back.).  ?Neurological:  Negative for dizziness and headaches.  ?Psychiatric/Behavioral:  Negative for agitation, self-injury, sleep disturbance and suicidal ideas. The patient is nervous/anxious.   ?All other systems reviewed and are negative.. ? ?  ?Objective:  ?  ?Physical Exam ?Vitals reviewed.  ?Constitutional:   ?   General: She is not in acute distress. ?   Appearance: Normal appearance. She is not ill-appearing or toxic-appearing.  ?HENT:  ?   Right Ear: Tympanic membrane normal.  ?   Left Ear: Tympanic membrane normal.  ?   Mouth/Throat:  ?   Mouth: Mucous membranes are moist.  ?   Pharynx: No pharyngeal swelling.  ?   Tonsils: No tonsillar exudate.  ?Eyes:  ?   Extraocular Movements: Extraocular movements intact.  ?   Conjunctiva/sclera: Conjunctivae normal.  ?   Pupils: Pupils are equal, round, and reactive to light.  ?Neck:  ?   Thyroid: No thyroid mass.  ?Cardiovascular:  ?   Rate and Rhythm: Normal rate and regular rhythm.  ?Pulmonary:  ?   Effort: Pulmonary effort is normal.  ?   Breath sounds: Normal breath sounds.  ?Abdominal:  ?   General: Abdomen is flat. Bowel sounds are normal.  ?   Palpations: Abdomen is soft.  ?Musculoskeletal:     ?   General: Normal range of motion.  ?Lymphadenopathy:  ?   Cervical:  ?   Right cervical: No superficial cervical adenopathy. ?   Left cervical: No superficial cervical adenopathy.  ?Skin: ?   General: Skin is warm.  ?   Capillary Refill: Capillary refill takes less than 2 seconds.  ?Neurological:  ?   General: No focal deficit present.  ?   Mental Status: She is alert and oriented to person, place, and time.  ?Psychiatric:     ?   Mood and Affect: Mood normal.     ?   Behavior: Behavior normal.     ?   Thought Content: Thought content normal.     ?   Judgment: Judgment normal.  ? ? ? ?BP 112/68   Pulse 74   Temp 99.3 ?F (37.4 ?C)   Resp 16   Ht 5' 5" (1.651 m)   Wt 233 lb 6 oz (105.9 kg)   LMP 03/30/2022    SpO2 99%   BMI 38.84 kg/m?  ?Wt Readings from Last 3 Encounters:  ?04/20/22 233 lb 6 oz (105.9 kg)  ?06/30/21 227 lb 6.4 oz (103.1 kg)  ?10/20/20 224 lb 9.6 oz (101.9 kg)  ? ? ? ?There are no preventive care reminders to display for this patient. ? ? ?There are no preventive care reminders to display  for this patient. ? ?Lab Results  ?Component Value Date  ? TSH 1.230 06/30/2021  ? ?Lab Results  ?Component Value Date  ? WBC 7.1 06/30/2021  ? HGB 12.3 06/30/2021  ? HCT 36.7 06/30/2021  ? MCV 87 06/30/2021  ? PLT 334 06/30/2021  ? ?Lab Results  ?Component Value Date  ? NA 141 06/30/2021  ? K 5.0 06/30/2021  ? CO2 19 (L) 06/30/2021  ? GLUCOSE 81 06/30/2021  ? BUN 9 06/30/2021  ? CREATININE 0.96 06/30/2021  ? BILITOT 0.2 06/30/2021  ? ALKPHOS 74 06/30/2021  ? AST 15 06/30/2021  ? ALT 11 06/30/2021  ? PROT 6.9 06/30/2021  ? ALBUMIN 4.3 06/30/2021  ? CALCIUM 9.6 06/30/2021  ? ANIONGAP 6 06/10/2016  ? EGFR 77 06/30/2021  ? ?Lab Results  ?Component Value Date  ? CHOL 227 (H) 06/30/2021  ? ?Lab Results  ?Component Value Date  ? HDL 69 06/30/2021  ? ?Lab Results  ?Component Value Date  ? LDLCALC 143 (H) 06/30/2021  ? ?Lab Results  ?Component Value Date  ? TRIG 84 06/30/2021  ? ?Lab Results  ?Component Value Date  ? CHOLHDL 3.2 10/20/2020  ? ?Lab Results  ?Component Value Date  ? HGBA1C 5.6 10/20/2020  ? ? ?  ?Assessment & Plan:  ? ?Problem List Items Addressed This Visit   ? ?  ? Musculoskeletal and Integument  ? Tinea versicolor - Primary  ?  rx ketoconazole 2% cream ?Consider terbinafine if no improvement ?Ordering lfts to see if baseline stable ? ?  ?  ? Relevant Medications  ? ketoconazole (NIZORAL) 2 % cream  ?  ? Other  ? Class 2 obesity without serious comorbidity with body mass index (BMI) of 37.0 to 37.9 in adult  ?  Work on diet and exercise as tolerated ? ? ?  ?  ? GAD (generalized anxiety disorder)  ?  Refill lexapro 10 mg once daily ?D/w pt on daily regimen, do not skip days ?Can consider wellbutrin if restart  and decreased libido ?Work on anxiety reducing techniques ? ?  ?  ? Relevant Orders  ? TSH  ? CBC with Differential  ? Elevated lipids  ?  Ordered lipid panel, pending results. Work on low cholesterol diet and exe

## 2022-04-20 NOTE — Assessment & Plan Note (Signed)
Refill lexapro 10 mg once daily ?D/w pt on daily regimen, do not skip days ?Can consider wellbutrin if restart and decreased libido ?Work on anxiety reducing techniques ?

## 2022-04-20 NOTE — Assessment & Plan Note (Signed)
rx ketoconazole 2% cream ?Consider terbinafine if no improvement ?Ordering lfts to see if baseline stable ?

## 2022-04-20 NOTE — Progress Notes (Deleted)
est

## 2022-04-21 ENCOUNTER — Other Ambulatory Visit (INDEPENDENT_AMBULATORY_CARE_PROVIDER_SITE_OTHER): Payer: Managed Care, Other (non HMO)

## 2022-04-21 DIAGNOSIS — E559 Vitamin D deficiency, unspecified: Secondary | ICD-10-CM | POA: Diagnosis not present

## 2022-04-21 DIAGNOSIS — E669 Obesity, unspecified: Secondary | ICD-10-CM

## 2022-04-21 DIAGNOSIS — F411 Generalized anxiety disorder: Secondary | ICD-10-CM | POA: Diagnosis not present

## 2022-04-21 DIAGNOSIS — E785 Hyperlipidemia, unspecified: Secondary | ICD-10-CM | POA: Diagnosis not present

## 2022-04-21 LAB — COMPREHENSIVE METABOLIC PANEL
ALT: 10 U/L (ref 0–35)
AST: 13 U/L (ref 0–37)
Albumin: 3.9 g/dL (ref 3.5–5.2)
Alkaline Phosphatase: 62 U/L (ref 39–117)
BUN: 9 mg/dL (ref 6–23)
CO2: 25 mEq/L (ref 19–32)
Calcium: 9.2 mg/dL (ref 8.4–10.5)
Chloride: 105 mEq/L (ref 96–112)
Creatinine, Ser: 0.99 mg/dL (ref 0.40–1.20)
GFR: 71.06 mL/min (ref >60.00)
Glucose, Bld: 90 mg/dL (ref 70–99)
Potassium: 4.8 mEq/L (ref 3.5–5.1)
Sodium: 137 mEq/L (ref 135–145)
Total Bilirubin: 0.3 mg/dL (ref 0.2–1.2)
Total Protein: 6.7 g/dL (ref 6.0–8.3)

## 2022-04-21 LAB — CBC WITH DIFFERENTIAL/PLATELET
Basophils Absolute: 0.1 10*3/uL (ref 0.0–0.1)
Basophils Relative: 1 % (ref 0.0–3.0)
Eosinophils Absolute: 0.1 10*3/uL (ref 0.0–0.7)
Eosinophils Relative: 1.3 % (ref 0.0–5.0)
HCT: 35.6 % — ABNORMAL LOW (ref 36.0–46.0)
Hemoglobin: 11.7 g/dL — ABNORMAL LOW (ref 12.0–15.0)
Lymphocytes Relative: 27.6 % (ref 12.0–46.0)
Lymphs Abs: 2 10*3/uL (ref 0.7–4.0)
MCHC: 32.9 g/dL (ref 30.0–36.0)
MCV: 87.3 fl (ref 78.0–100.0)
Monocytes Absolute: 0.4 10*3/uL (ref 0.1–1.0)
Monocytes Relative: 5 % (ref 3.0–12.0)
Neutro Abs: 4.8 10*3/uL (ref 1.4–7.7)
Neutrophils Relative %: 65.1 % (ref 43.0–77.0)
Platelets: 339 10*3/uL (ref 150.0–400.0)
RBC: 4.08 Mil/uL (ref 3.87–5.11)
RDW: 14.7 % (ref 11.5–15.5)
WBC: 7.3 10*3/uL (ref 4.0–10.5)

## 2022-04-21 LAB — LIPID PANEL
Cholesterol: 196 mg/dL (ref 0–200)
HDL: 67.7 mg/dL (ref >39.00)
LDL Cholesterol: 105 mg/dL — ABNORMAL HIGH (ref 0–99)
NonHDL: 128.42
Total CHOL/HDL Ratio: 3
Triglycerides: 117 mg/dL (ref 0.0–149.0)
VLDL: 23.4 mg/dL (ref 0.0–40.0)

## 2022-04-21 LAB — TSH: TSH: 2.06 u[IU]/mL (ref 0.35–5.50)

## 2022-04-21 LAB — VITAMIN D 25 HYDROXY (VIT D DEFICIENCY, FRACTURES): VITD: 26.28 ng/mL — ABNORMAL LOW (ref 30.00–100.00)

## 2022-04-23 ENCOUNTER — Other Ambulatory Visit: Payer: Self-pay | Admitting: Family

## 2022-04-23 DIAGNOSIS — E559 Vitamin D deficiency, unspecified: Secondary | ICD-10-CM

## 2022-04-23 MED ORDER — VITAMIN D (ERGOCALCIFEROL) 1.25 MG (50000 UNIT) PO CAPS: 50000.0000 [IU] | ORAL_CAPSULE | ORAL | 0 refills | Status: AC

## 2022-04-23 NOTE — Progress Notes (Signed)
Work on [employer] ?Your vitamin D was a bit on the lower range. I will send an RX for vitamin D3 50,000 IU which you will take once weekly for 8 weeks. Once RX is complete, please continue over the counter Vitamin D3 1000 IU once daily. Return to the clinic for a follow up in three months to repeat your Vitamin D level.  ? ?Some new slight anemia, does pt have heavy periods? Feeling any fatigue, suggest starting otc iron.  ?Thyroid ok.

## 2022-05-24 ENCOUNTER — Ambulatory Visit
Admission: EM | Admit: 2022-05-24 | Discharge: 2022-05-24 | Disposition: A | Discharge status: Home / Self Care | Payer: Managed Care, Other (non HMO) | Attending: Emergency Medicine | Admitting: Emergency Medicine

## 2022-05-24 ENCOUNTER — Encounter: Payer: Self-pay | Admitting: Emergency Medicine

## 2022-05-24 DIAGNOSIS — L237 Allergic contact dermatitis due to plants, except food: Secondary | ICD-10-CM | POA: Diagnosis not present

## 2022-05-24 MED ORDER — PREDNISONE 20 MG PO TABS: 60.0000 mg | ORAL_TABLET | Freq: Every day | ORAL | 0 refills | Status: AC

## 2022-05-24 MED ORDER — TRIAMCINOLONE ACETONIDE 40 MG/ML IJ SUSP: 40.0000 mg | Freq: Once | INTRAMUSCULAR | Status: DC

## 2022-05-24 MED ORDER — TRIAMCINOLONE ACETONIDE 0.1 % EX CREA: 1.0000 "application " | TOPICAL_CREAM | Freq: Four times a day (QID) | CUTANEOUS | 0 refills | Status: DC

## 2022-05-24 NOTE — ED Provider Notes (Signed)
[facility] Provider Note  Patient Contact: 10:34 AM (approximate)   History   Rash   HPI  Stacey Santiago is a 41 y.o. female who presents to the urgent care complaining of pruritic rash.  Patient believes that she has been exposed to poison ivy.  Patient states that she has been using an over-the-counter anti-itch cream but symptoms are not improving.  No respiratory complaints.  No new foods, medications, topicals, shampoos.     Physical Exam   Triage Vital Signs: ED Triage Vitals  Enc Vitals Group     BP 05/24/22 1028 122/85     Pulse Rate 05/24/22 1028 84     Resp 05/24/22 1028 18     Temp 05/24/22 1028 99.4 F (37.4 C)     Temp Source 05/24/22 1028 Oral     SpO2 05/24/22 1028 97 %     Weight --      Height --      Head Circumference --      Peak Flow --      Pain Score 05/24/22 1027 0     Pain Loc --      Pain Edu? --      Excl. in GC? --     Most recent vital signs: Vitals:   05/24/22 1028  BP: 122/85  Pulse: 84  Resp: 18  Temp: 99.4 F (37.4 C)  SpO2: 97%     General: Alert and in no acute distress.  Cardiovascular:  Good peripheral perfusion Respiratory: Normal respiratory effort without tachypnea or retractions. Lungs CTAB.  Musculoskeletal: Full range of motion to all extremities.  Neurologic:  No gross focal neurologic deficits are appreciated.  Skin:   Scattered papular rash consistent with poison ivy dermatitis noted to the patient's right neck, right arm, left arm, right leg Other:   ED Results / Procedures / Treatments   Labs (all labs ordered are listed, but only abnormal results are displayed) Labs Reviewed - No data to display   EKG     RADIOLOGY    No results found.  PROCEDURES:  Critical Care performed: No  Procedures   MEDICATIONS ORDERED IN ED: Medications  triamcinolone acetonide (KENALOG-40) injection 40 mg (has no administration in time range)     IMPRESSION / MDM / ASSESSMENT AND PLAN / ED  COURSE  I reviewed the triage vital signs and the nursing notes.                              Differential diagnosis includes, but is not limited to, contact dermatitis, poison ivy dermatitis, poison oak dermatitis, hives, allergic reaction  Patient's presentation is most consistent with acute illness / injury with system symptoms.   Patient's diagnosis is consistent with poison ivy dermatitis.  Patient presents to the urgent care complaining of a scattered rash to the neck, both arms and right leg.  Findings were consistent with poison ivy dermatitis.  Unfortunately we do not have get along so patient will go on 3-week course of prednisone.  I will prescribe triamcinolone topically for symptom relief at home.  Follow-up primary care as needed..  Patient is given ED precautions to return to the ED for any worsening or new symptoms.        FINAL CLINICAL IMPRESSION(S) / ED DIAGNOSES   Final diagnoses:  Poison ivy dermatitis     Rx / DC Orders   ED Discharge Orders  Ordered    predniSONE (DELTASONE) 20 MG tablet  Daily        05/24/22 1048    triamcinolone cream (KENALOG) 0.1 %  4 times daily        05/24/22 1048             Note:  This document was prepared using Dragon voice recognition software and may include unintentional dictation errors.   Racheal Patches, PA-C 05/24/22 1049

## 2022-05-24 NOTE — ED Triage Notes (Signed)
Pt presents with a rash on her arms, neck and legs x 1 week.

## 2022-08-02 ENCOUNTER — Other Ambulatory Visit: Payer: Self-pay | Admitting: Family

## 2022-08-02 ENCOUNTER — Telehealth: Payer: Self-pay

## 2022-08-02 DIAGNOSIS — Z3009 Encounter for other general counseling and advice on contraception: Secondary | ICD-10-CM

## 2022-08-02 MED ORDER — DESOGESTREL-ETHINYL ESTRADIOL 0.15-30 MG-MCG PO TABS: 1.0000 | ORAL_TABLET | Freq: Every day | ORAL | 3 refills | Status: DC

## 2022-08-02 NOTE — Telephone Encounter (Signed)
MEDICATION:APRI 0.15-30 MG-MCG tablet  PHARMACY: [id number] - [place], [place] - [address]  Comments: Wanting to to know if Stacey Santiago will take over the medication   **Let patient know to contact pharmacy at the end of the day to make sure medication is ready. **  ** Please notify patient to allow 48-72 hours to process**  **Encourage patient to contact the pharmacy for refills or they can request refills through [facility]**

## 2022-09-25 ENCOUNTER — Ambulatory Visit
Admission: RE | Admit: 2022-09-25 | Discharge: 2022-09-25 | Disposition: A | Discharge status: Home / Self Care | Payer: Managed Care, Other (non HMO) | Source: Ambulatory Visit | Attending: Family | Admitting: Family

## 2022-09-25 DIAGNOSIS — N6320 Unspecified lump in the left breast, unspecified quadrant: Secondary | ICD-10-CM | POA: Insufficient documentation

## 2022-09-25 DIAGNOSIS — R92333 Mammographic heterogeneous density, bilateral breasts: Secondary | ICD-10-CM | POA: Insufficient documentation

## 2022-09-25 DIAGNOSIS — Z1239 Encounter for other screening for malignant neoplasm of breast: Secondary | ICD-10-CM | POA: Insufficient documentation

## 2022-10-23 ENCOUNTER — Ambulatory Visit: Payer: Managed Care, Other (non HMO) | Admitting: Family

## 2022-10-23 ENCOUNTER — Encounter: Payer: Self-pay | Admitting: Family

## 2022-10-23 VITALS — BP 104/76 | HR 74 | Temp 98.5°F | Resp 16 | Ht 65.0 in | Wt 235.0 lb

## 2022-10-23 DIAGNOSIS — E559 Vitamin D deficiency, unspecified: Secondary | ICD-10-CM | POA: Diagnosis not present

## 2022-10-23 DIAGNOSIS — E669 Obesity, unspecified: Secondary | ICD-10-CM

## 2022-10-23 DIAGNOSIS — E785 Hyperlipidemia, unspecified: Secondary | ICD-10-CM

## 2022-10-23 DIAGNOSIS — F411 Generalized anxiety disorder: Secondary | ICD-10-CM

## 2022-10-23 DIAGNOSIS — F4322 Adjustment disorder with anxiety: Secondary | ICD-10-CM | POA: Diagnosis not present

## 2022-10-23 DIAGNOSIS — D500 Iron deficiency anemia secondary to blood loss (chronic): Secondary | ICD-10-CM | POA: Diagnosis not present

## 2022-10-23 DIAGNOSIS — Z23 Encounter for immunization: Secondary | ICD-10-CM

## 2022-10-23 DIAGNOSIS — B360 Pityriasis versicolor: Secondary | ICD-10-CM

## 2022-10-23 LAB — CBC
HCT: 38.3 % (ref 36.0–46.0)
Hemoglobin: 12.6 g/dL (ref 12.0–15.0)
MCHC: 33 g/dL (ref 30.0–36.0)
MCV: 88.9 fl (ref 78.0–100.0)
Platelets: 363 10*3/uL (ref 150.0–400.0)
RBC: 4.31 Mil/uL (ref 3.87–5.11)
RDW: 14.1 % (ref 11.5–15.5)
WBC: 7.9 10*3/uL (ref 4.0–10.5)

## 2022-10-23 LAB — IBC + FERRITIN
Ferritin: 20.7 ng/mL (ref 10.0–291.0)
Iron: 56 ug/dL (ref 42–145)
Saturation Ratios: 11.5 % — ABNORMAL LOW (ref 20.0–50.0)
TIBC: 488.6 ug/dL — ABNORMAL HIGH (ref 250.0–450.0)
Transferrin: 349 mg/dL (ref 212.0–360.0)

## 2022-10-23 LAB — VITAMIN D 25 HYDROXY (VIT D DEFICIENCY, FRACTURES): VITD: 45.21 ng/mL (ref 30.00–100.00)

## 2022-10-23 MED ORDER — ESCITALOPRAM OXALATE 10 MG PO TABS: 10.0000 mg | ORAL_TABLET | Freq: Every day | ORAL | 3 refills | Status: DC

## 2022-10-23 NOTE — Assessment & Plan Note (Signed)
Continue lexapro 20 mg once daily   Reviewed phq9

## 2022-10-23 NOTE — Assessment & Plan Note (Signed)
Work on diet and exercise as tolerated  ?

## 2022-10-23 NOTE — Assessment & Plan Note (Signed)
Advised pt to continue to work on low chol diet and exercise as tolerated

## 2022-10-23 NOTE — Assessment & Plan Note (Signed)
Ordering cbc ibc ferritin Pending results 

## 2022-10-23 NOTE — Progress Notes (Signed)
Established Patient Office Visit  Subjective:  Patient ID: Stacey Santiago, female    DOB: 08-10-1981  Age: 41 y.o. MRN: [id number]  CC:  Chief Complaint  Patient presents with   Anxiety    HPI Stacey Santiago is here today for follow up.   Repeat mammogram and left breast u/s , due in October. Had completed 09/25/22 and without evidence of malignancy with either breast, advised to continue routine screening.   Tinea vesicolor: was started on ketoconazole. Completed resolved since last visit. Uses clotrimazole if she notices it come back.   GAD: refilled lexapro 10 mg last visit. Anxiety seems to be doing well and well controlled, as long as she stays on lexapro she feels it under control. Ad a stressful summer, husband was in [place] and mom passed away unexpectedly (passed in June 2023).     10/23/2022    8:35 AM 06/30/2021    9:35 AM 10/20/2020    8:58 AM  PHQ9 SCORE ONLY  PHQ-9 Total Score 0 2 0    Lab Results  Component Value Date   CHOL 196 04/21/2022   HDL 67.70 04/21/2022   LDLCALC 105 (H) 04/21/2022   TRIG 117.0 04/21/2022   CHOLHDL 3 04/21/2022    Vitamin d def: completed RX dosing. Taking mvi otc but not daily   Past Medical History:  Diagnosis Date   Anxiety    Depression    GERD (gastroesophageal reflux disease)     Past Surgical History:  Procedure Laterality Date   LAPAROSCOPIC APPENDECTOMY N/A 06/09/2016   Procedure: APPENDECTOMY LAPAROSCOPIC;  Surgeon: Clayburn Pert, MD;  Location: [facility] ORS;  Service: General;  Laterality: N/A;   WISDOM TOOTH EXTRACTION  19 yrs. of age    Family History  Problem Relation Age of Onset   Diabetes Mother    Hypertension Mother    Hyperlipidemia Mother    Hypertension Father    Hyperlipidemia Father    Heart attack Maternal Grandmother        16   Diabetes Maternal Grandfather    Heart attack Maternal Grandfather        60's   Breast cancer Neg Hx     Social History   Socioeconomic History   Marital status:  Married    Spouse name: Percell Miller   Number of children: 2   Years of education: Not on file   Highest education level: Not on file  Occupational History   Occupation: Magazine features editor    Comment: [employer]   Occupation: Optometrist    Comment: [employer]  Tobacco Use   Smoking status: Never   Smokeless tobacco: Never  Vaping Use   Vaping Use: Never used  Substance and Sexual Activity   Alcohol use: Yes    Comment: social   Drug use: No   Sexual activity: Yes    Partners: Male    Birth control/protection: Pill, OCP  Other Topics Concern   Not on file  Social History Narrative   Working at [employer], Financial risk analyst , working from home.    Also working with [employer]   Social Determinants of Health   Financial Resource Strain: Not on file  Food Insecurity: Not on file  Transportation Needs: Not on file  Physical Activity: Not on file  Stress: Not on file  Social Connections: Not on file  Intimate Partner Violence: Not on file    Outpatient Medications Prior to Visit  Medication Sig Dispense Refill   desogestrel-ethinyl estradiol (APRI) 0.15-30 MG-MCG  tablet Take 1 tablet by mouth daily. 84 tablet 3   triamcinolone cream (KENALOG) 0.1 % Apply 1 application  topically 4 (four) times daily. 30 g 0   escitalopram (LEXAPRO) 10 MG tablet Take 1 tablet (10 mg total) by mouth daily. Please schedule office visit before any future refill. 90 tablet 0   No facility-administered medications prior to visit.    No Known Allergies        Objective:    Physical Exam Constitutional:      Appearance: Normal appearance. She is obese.  Cardiovascular:     Rate and Rhythm: Normal rate and regular rhythm.  Pulmonary:     Effort: Pulmonary effort is normal.     Breath sounds: Normal breath sounds.  Musculoskeletal:     Right lower leg: No edema.     Left lower leg: No edema.  Neurological:     General: No focal deficit present.     Mental Status: She is alert and  oriented to person, place, and time. Mental status is at baseline.  Psychiatric:        Mood and Affect: Mood normal.        Behavior: Behavior normal.        Thought Content: Thought content normal.        Judgment: Judgment normal.       BP 104/76   Pulse 74   Temp 98.5 F (36.9 C)   Resp 16   Ht 5' 5" (1.651 m)   Wt 235 lb (106.6 kg)   LMP 09/21/2022   SpO2 99%   BMI 39.11 kg/m  Wt Readings from Last 3 Encounters:  10/23/22 235 lb (106.6 kg)  04/20/22 233 lb 6 oz (105.9 kg)  06/30/21 227 lb 6.4 oz (103.1 kg)     Health Maintenance Due  Topic Date Due   COVID-19 Vaccine (3 - Pfizer series) 05/20/2020   INFLUENZA VACCINE  07/11/2022    There are no preventive care reminders to display for this patient.  Lab Results  Component Value Date   TSH 2.06 04/21/2022   Lab Results  Component Value Date   WBC 7.3 04/21/2022   HGB 11.7 (L) 04/21/2022   HCT 35.6 (L) 04/21/2022   MCV 87.3 04/21/2022   PLT 339.0 04/21/2022   Lab Results  Component Value Date   NA 137 04/21/2022   K 4.8 04/21/2022   CO2 25 04/21/2022   GLUCOSE 90 04/21/2022   BUN 9 04/21/2022   CREATININE 0.99 04/21/2022   BILITOT 0.3 04/21/2022   ALKPHOS 62 04/21/2022   AST 13 04/21/2022   ALT 10 04/21/2022   PROT 6.7 04/21/2022   ALBUMIN 3.9 04/21/2022   CALCIUM 9.2 04/21/2022   ANIONGAP 6 06/10/2016   EGFR 77 06/30/2021   GFR 71.06 04/21/2022   Lab Results  Component Value Date   CHOL 196 04/21/2022   Lab Results  Component Value Date   HDL 67.70 04/21/2022   Lab Results  Component Value Date   LDLCALC 105 (H) 04/21/2022   Lab Results  Component Value Date   TRIG 117.0 04/21/2022   Lab Results  Component Value Date   CHOLHDL 3 04/21/2022   Lab Results  Component Value Date   HGBA1C 5.6 10/20/2020      Assessment & Plan:   Problem List Items Addressed This Visit       Musculoskeletal and Integument   RESOLVED: Tinea versicolor - Primary     Other  GAD  (generalized anxiety disorder)    Continue lexapro 20 mg once daily   Reviewed phq9        Relevant Medications   escitalopram (LEXAPRO) 10 MG tablet   Elevated lipids    Advised pt to continue to work on low chol diet and exercise as tolerated      Obesity (BMI 30-39.9)    Work on diet and exercise as tolerated       Vitamin D deficiency    Ordered vitamin d pending results.  Did advise pt to take otc vitamin D 1000 IU once daily      Relevant Orders   VITAMIN D 25 Hydroxy (Vit-D Deficiency, Fractures)   Iron deficiency anemia due to chronic blood loss    Ordering cbc ibc ferritin  Pending results.      Relevant Orders   CBC   IBC + Ferritin   Other Visit Diagnoses     Need for influenza vaccination       Relevant Orders   Flu Vaccine QUAD 79moIM (Fluarix, Fluzone & Alfiuria Quad PF)   Adjustment disorder with anxious mood       Relevant Medications   escitalopram (LEXAPRO) 10 MG tablet       Meds ordered this encounter  Medications   escitalopram (LEXAPRO) 10 MG tablet    Sig: Take 1 tablet (10 mg total) by mouth daily. Please schedule office visit before any future refill.    Dispense:  90 tablet    Refill:  3    Requesting 1 year supply    Order Specific Question:   Supervising Provider    Answer:   BDiona Browner AMY E [[[id number]]   Follow-up: Return in about 6 months (around 04/23/2023) for f/u anxiety.    TEugenia Pancoast FNP

## 2022-10-23 NOTE — Assessment & Plan Note (Signed)
Ordered vitamin d pending results.  Did advise pt to take otc vitamin D 1000 IU once daily

## 2023-05-01 ENCOUNTER — Encounter: Payer: Self-pay | Admitting: Family

## 2023-05-01 ENCOUNTER — Ambulatory Visit (INDEPENDENT_AMBULATORY_CARE_PROVIDER_SITE_OTHER): Payer: Managed Care, Other (non HMO) | Admitting: Family

## 2023-05-01 VITALS — BP 126/76 | HR 82 | Temp 98.2°F | Ht 65.0 in | Wt 242.8 lb

## 2023-05-01 DIAGNOSIS — F411 Generalized anxiety disorder: Secondary | ICD-10-CM

## 2023-05-01 DIAGNOSIS — D500 Iron deficiency anemia secondary to blood loss (chronic): Secondary | ICD-10-CM

## 2023-05-01 DIAGNOSIS — Z0000 Encounter for general adult medical examination without abnormal findings: Secondary | ICD-10-CM | POA: Insufficient documentation

## 2023-05-01 DIAGNOSIS — Z6841 Body Mass Index (BMI) 40.0 and over, adult: Secondary | ICD-10-CM

## 2023-05-01 DIAGNOSIS — Z3009 Encounter for other general counseling and advice on contraception: Secondary | ICD-10-CM

## 2023-05-01 DIAGNOSIS — Z1231 Encounter for screening mammogram for malignant neoplasm of breast: Secondary | ICD-10-CM | POA: Diagnosis not present

## 2023-05-01 DIAGNOSIS — E785 Hyperlipidemia, unspecified: Secondary | ICD-10-CM

## 2023-05-01 DIAGNOSIS — F4322 Adjustment disorder with anxiety: Secondary | ICD-10-CM

## 2023-05-01 DIAGNOSIS — E559 Vitamin D deficiency, unspecified: Secondary | ICD-10-CM | POA: Diagnosis not present

## 2023-05-01 LAB — LIPID PANEL
Cholesterol: 192 mg/dL (ref 0–200)
HDL: 68.1 mg/dL (ref >39.00)
LDL Cholesterol: 106 mg/dL — ABNORMAL HIGH (ref 0–99)
NonHDL: 123.85
Total CHOL/HDL Ratio: 3
Triglycerides: 91 mg/dL (ref 0.0–149.0)
VLDL: 18.2 mg/dL (ref 0.0–40.0)

## 2023-05-01 LAB — BASIC METABOLIC PANEL
BUN: 10 mg/dL (ref 6–23)
CO2: 23 mEq/L (ref 19–32)
Calcium: 9.1 mg/dL (ref 8.4–10.5)
Chloride: 105 mEq/L (ref 96–112)
Creatinine, Ser: 0.94 mg/dL (ref 0.40–1.20)
GFR: 75.08 mL/min (ref >60.00)
Glucose, Bld: 89 mg/dL (ref 70–99)
Potassium: 4.1 mEq/L (ref 3.5–5.1)
Sodium: 139 mEq/L (ref 135–145)

## 2023-05-01 LAB — CBC WITH DIFFERENTIAL/PLATELET
Basophils Absolute: 0.1 10*3/uL (ref 0.0–0.1)
Basophils Relative: 1 % (ref 0.0–3.0)
Eosinophils Absolute: 0.1 10*3/uL (ref 0.0–0.7)
Eosinophils Relative: 1.2 % (ref 0.0–5.0)
HCT: 36.6 % (ref 36.0–46.0)
Hemoglobin: 12 g/dL (ref 12.0–15.0)
Lymphocytes Relative: 30.1 % (ref 12.0–46.0)
Lymphs Abs: 2 10*3/uL (ref 0.7–4.0)
MCHC: 32.8 g/dL (ref 30.0–36.0)
MCV: 88.4 fl (ref 78.0–100.0)
Monocytes Absolute: 0.4 10*3/uL (ref 0.1–1.0)
Monocytes Relative: 5.6 % (ref 3.0–12.0)
Neutro Abs: 4.1 10*3/uL (ref 1.4–7.7)
Neutrophils Relative %: 62.1 % (ref 43.0–77.0)
Platelets: 360 10*3/uL (ref 150.0–400.0)
RBC: 4.14 Mil/uL (ref 3.87–5.11)
RDW: 14.8 % (ref 11.5–15.5)
WBC: 6.5 10*3/uL (ref 4.0–10.5)

## 2023-05-01 LAB — IBC + FERRITIN
Ferritin: 10.4 ng/mL (ref 10.0–291.0)
Iron: 38 ug/dL — ABNORMAL LOW (ref 42–145)
Saturation Ratios: 6.9 % — ABNORMAL LOW (ref 20.0–50.0)
TIBC: 550.2 ug/dL — ABNORMAL HIGH (ref 250.0–450.0)
Transferrin: 393 mg/dL — ABNORMAL HIGH (ref 212.0–360.0)

## 2023-05-01 LAB — VITAMIN D 25 HYDROXY (VIT D DEFICIENCY, FRACTURES): VITD: 40.23 ng/mL (ref 30.00–100.00)

## 2023-05-01 MED ORDER — ESCITALOPRAM OXALATE 10 MG PO TABS: 10.0000 mg | ORAL_TABLET | Freq: Every day | ORAL | 3 refills | Status: DC

## 2023-05-01 MED ORDER — DESOGESTREL-ETHINYL ESTRADIOL 0.15-30 MG-MCG PO TABS: 1.0000 | ORAL_TABLET | Freq: Every day | ORAL | 3 refills | Status: DC

## 2023-05-01 NOTE — Assessment & Plan Note (Signed)
Pt advised to work on diet and exercise as tolerated  

## 2023-05-01 NOTE — Assessment & Plan Note (Signed)
Ordered vitamin d pending results.   

## 2023-05-01 NOTE — Assessment & Plan Note (Signed)
Stable doing well  Rx lexapro refill sent in for 10 mg once daily

## 2023-05-01 NOTE — Assessment & Plan Note (Signed)
Patient Counseling(The following topics were reviewed): ? Preventative care handout given to pt  ?Health maintenance and immunizations reviewed. Please refer to Health maintenance section. ?Pt advised on safe sex, wearing seatbelts in car, and proper nutrition ?labwork ordered today for annual ?Dental health: Discussed importance of regular tooth brushing, flossing, and dental visits. ? ? ?

## 2023-05-01 NOTE — Assessment & Plan Note (Signed)
Check cbc ibc ferritin

## 2023-05-01 NOTE — Patient Instructions (Addendum)
I have sent an electronic order over to your preferred location for the following:  Schedule for after 09/26/2023  []   2D Mammogram  [x]   3D Mammogram  []   Bone Density   Please give this center a call to get scheduled at your convenience.  [x]   [facility]  [address] [place] [address]  [phone]  Make sure to wear two piece  clothing  No lotions powders or deodorants the day of the appointment Make sure to bring picture ID and insurance card.  Bring list of medications you are currently taking including any supplements.    ------------------------------------  A referral was placed today. Please let us know if you have not heard back within 2 weeks about the referral.  ------------------------------------  Stop by the lab prior to leaving today. I will notify you of your results once received.   Recommendations on keeping yourself healthy:  - Exercise at least 30-45 minutes a day, 3-4 days a week.  - Eat a low-fat diet with lots of fruits and vegetables, up to 7-9 servings per day.  - Seatbelts can save your life. Wear them always.  - Smoke detectors on every level of your home, check batteries every year.  - Eye Doctor - have an eye exam every 1-2 years  - Safe sex - if you may be exposed to STDs, use a condom.  - Alcohol -  If you drink, do it moderately, less than 2 drinks per day.  - Health Care Power of Attorney. Choose someone to speak for you if you are not able.  - Depression is common in our stressful world.If you're feeling down or losing interest in things you normally enjoy, please come in for a visit.  - Violence - If anyone is threatening or hurting you, please call immediately.  Due to recent changes in healthcare laws, you may see results of your imaging and/or laboratory studies on MyChart before I have had a chance to review them.  I understand that in some cases there may be results  that are confusing or concerning to you. Please understand that not all results are received at the same time and often I may need to interpret multiple results in order to provide you with the best plan of care or course of treatment. Therefore, I ask that you please give me 2 business days to thoroughly review all your results before contacting my office for clarification. Should we see a critical lab result, you will be contacted sooner.   I will see you again in one year for your annual comprehensive exam unless otherwise stated and or with acute concerns.  It was a pleasure seeing you today! Please do not hesitate to reach out with any questions and or concerns.  Regards,   Mort Sawyers

## 2023-05-01 NOTE — Assessment & Plan Note (Signed)
Ordered lipid panel, pending results. Work on low cholesterol diet and exercise as tolerated  

## 2023-05-01 NOTE — Progress Notes (Signed)
Subjective:  Patient ID: Stacey Santiago, female    DOB: 1981/01/31  Age: 42 y.o. MRN: [id number]  Patient Care Team: Mort Sawyers, FNP as PCP - General (Family Medicine)   CC:  Chief Complaint  Patient presents with   Annual Exam    HPI Stacey Santiago is a 42 y.o. female who presents today for an annual physical exam. She reports consuming a general diet.  tries to but not often  She generally feels well. She reports sleeping well. She does not have additional problems to discuss today.   Vision:Not within last year Dental:Receives regular dental care STD:The patient denies history of sexually transmitted disease.  Lung Cancer Screening with low-dose Chest CT: n/a - Adults age 31-80 who are current cigarette smokers or quit within the last 15 years. Must have 20 pack year history.  AAA Screening: n/a - Men age 38-75 who have ever smoked  Mammogram: 09/26/2023 Last pap: 10/20/20  Pt is without acute concerns.   Advanced Directives Patient does not have advanced directives.   DEPRESSION SCREENING    05/01/2023    8:26 AM 10/23/2022    8:35 AM 06/30/2021    9:35 AM 10/20/2020    8:58 AM 09/13/2020    8:48 AM 09/13/2020    8:47 AM 10/20/2019    3:23 PM  PHQ 2/9 Scores  PHQ - 2 Score 0 0 1 0 0 0 1  PHQ- 9 Score   2         ROS: Negative unless specifically indicated above in HPI.    Current Outpatient Medications:    desogestrel-ethinyl estradiol (APRI) 0.15-30 MG-MCG tablet, Take 1 tablet by mouth daily., Disp: 84 tablet, Rfl: 3   escitalopram (LEXAPRO) 10 MG tablet, Take 1 tablet (10 mg total) by mouth daily. Please schedule office visit before any future refill., Disp: 90 tablet, Rfl: 3    Objective:    BP 126/76 (BP Location: Left Arm)   Pulse 82   Temp 98.2 F (36.8 C) (Temporal)   Ht 5\' 5"  (1.651 m)   Wt 242 lb 12.8 oz (110.1 kg)   SpO2 99%   BMI 40.40 kg/m   BP Readings from Last 3 Encounters:  05/01/23 126/76  10/23/22 104/76  05/24/22 122/85       Physical Exam Constitutional:      General: She is not in acute distress.    Appearance: Normal appearance. She is obese. She is not ill-appearing.  HENT:     Head: Normocephalic.     Right Ear: Tympanic membrane normal.     Left Ear: Tympanic membrane normal.     Nose: Nose normal.     Mouth/Throat:     Mouth: Mucous membranes are moist.  Eyes:     Extraocular Movements: Extraocular movements intact.     Pupils: Pupils are equal, round, and reactive to light.  Cardiovascular:     Rate and Rhythm: Normal rate and regular rhythm.  Pulmonary:     Effort: Pulmonary effort is normal.     Breath sounds: Normal breath sounds.  Abdominal:     General: Abdomen is flat. Bowel sounds are normal.     Palpations: Abdomen is soft.     Tenderness: There is no guarding or rebound.  Musculoskeletal:        General: Normal range of motion.     Cervical back: Normal range of motion.  Skin:    General: Skin is warm.     Capillary Refill: Capillary  refill takes less than 2 seconds.  Neurological:     General: No focal deficit present.     Mental Status: She is alert.  Psychiatric:        Mood and Affect: Mood normal.        Behavior: Behavior normal.        Thought Content: Thought content normal.        Judgment: Judgment normal.          Assessment & Plan:  Encounter for screening mammogram for malignant neoplasm of breast -     3D Screening Mammogram, Left and Right; Future  Family planning -     Desogestrel-Ethinyl Estradiol; Take 1 tablet by mouth daily.  Dispense: 84 tablet; Refill: 3  Adjustment disorder with anxious mood  Iron deficiency anemia due to chronic blood loss Assessment & Plan: Check cbc ibc ferritin   Orders: -     CBC with Differential/Platelet -     IBC + Ferritin  Elevated lipids Assessment & Plan: Ordered lipid panel, pending results. Work on low cholesterol diet and exercise as tolerated   Orders: -     Lipid panel  Vitamin D  deficiency Assessment & Plan: Ordered vitamin d pending results.    Orders: -     VITAMIN D 25 Hydroxy (Vit-D Deficiency, Fractures)  Encounter for general adult medical examination without abnormal findings Assessment & Plan: Patient Counseling(The following topics were reviewed):  Preventative care handout given to pt  Health maintenance and immunizations reviewed. Please refer to Health maintenance section. Pt advised on safe sex, wearing seatbelts in car, and proper nutrition labwork ordered today for annual Dental health: Discussed importance of regular tooth brushing, flossing, and dental visits.   Orders: -     Lipid panel -     CBC with Differential/Platelet -     IBC + Ferritin -     Basic metabolic panel -     VITAMIN D 25 Hydroxy (Vit-D Deficiency, Fractures)  GAD (generalized anxiety disorder) Assessment & Plan: Stable doing well  Rx lexapro refill sent in for 10 mg once daily   Orders: -     Escitalopram Oxalate; Take 1 tablet (10 mg total) by mouth daily. Please schedule office visit before any future refill.  Dispense: 90 tablet; Refill: 3  BMI 40.0-44.9, adult [facility]) Assessment & Plan: Pt advised to work on diet and exercise as tolerated     Orders: -     Amb Ref to Medical Weight Management      Follow-up: Return in about 1 year (around 04/30/2024) for f/u CPE.   Mort Sawyers, FNP

## 2023-05-30 ENCOUNTER — Ambulatory Visit (INDEPENDENT_AMBULATORY_CARE_PROVIDER_SITE_OTHER): Payer: Managed Care, Other (non HMO) | Admitting: Adult Health

## 2023-05-30 ENCOUNTER — Encounter (INDEPENDENT_AMBULATORY_CARE_PROVIDER_SITE_OTHER): Payer: Self-pay | Admitting: Adult Health

## 2023-05-30 VITALS — BP 114/76 | HR 71 | Temp 97.9°F | Ht 65.0 in | Wt 240.0 lb

## 2023-05-30 DIAGNOSIS — Z6839 Body mass index (BMI) 39.0-39.9, adult: Secondary | ICD-10-CM

## 2023-05-30 DIAGNOSIS — Z0289 Encounter for other administrative examinations: Secondary | ICD-10-CM

## 2023-05-30 DIAGNOSIS — E669 Obesity, unspecified: Secondary | ICD-10-CM

## 2023-05-30 DIAGNOSIS — E782 Mixed hyperlipidemia: Secondary | ICD-10-CM

## 2023-05-30 NOTE — Progress Notes (Signed)
Office: [phone]  /  Fax: [phone]   Initial Visit  Stacey Santiago was seen in clinic today to evaluate for obesity. She is interested in losing weight to improve overall health and reduce the risk of weight related complications. She presents today to review program treatment options, initial physical assessment, and evaluation.     She was referred by: PCP  When asked what else they would like to accomplish? She states: Improve energy levels and physical activity, Improve appearance, and Lose a target amount of weight : Current 240 lbs, goal to loss 60 lbs down to 180 lbs  Weight history: She began to gain weight after birth of second, 9 yrs ago. Her materanl family has hx of obesity, T2D, HTN  When asked how has your weight affected you? She states: Having fatigue and Having poor endurance  Some associated conditions: Hyperlipidemia and Vitamin D Deficiency  Contributing factors: Family history, Reduced physical activity, Life event, and Other: Her husband suffered CVA May 2024- Ms. Alpert is primary care giver for husband and two children age 101 (g) and 9 (b)  Weight promoting medications identified: Contraceptives or hormonal therapy  Current nutrition plan: None  Current level of physical activity: NEAT  Current or previous pharmacotherapy: Is interested in pharmacotherapy  Response to medication: Never tried medications and She is interested in injectable therapy   Past medical history includes:   Past Medical History:  Diagnosis Date   Anxiety    Depression    GERD (gastroesophageal reflux disease)      Objective:   BP 114/76   Pulse 71   Temp 97.9 F (36.6 C)   Ht 5\' 5"  (1.651 m)   Wt 240 lb (108.9 kg)   SpO2 98%   BMI 39.94 kg/m  She was weighed on the bioimpedance scale: Body mass index is 39.94 kg/m.  Peak Weight:240 lbs , Body Fat%:42.5, Visceral Fat Rating:12, Weight trend over the last 12 months: Increasing  General:  Alert, oriented and  cooperative. Patient is in no acute distress.  Respiratory: Normal respiratory effort, no problems with respiration noted   Gait: able to ambulate independently  Mental Status: Normal mood and affect. Normal behavior. Normal judgment and thought content.   DIAGNOSTIC DATA REVIEWED:  BMET    Component Value Date/Time   NA 139 05/01/2023 0901   NA 141 06/30/2021 1036   K 4.1 05/01/2023 0901   CL 105 05/01/2023 0901   CO2 23 05/01/2023 0901   GLUCOSE 89 05/01/2023 0901   BUN 10 05/01/2023 0901   BUN 9 06/30/2021 1036   CREATININE 0.94 05/01/2023 0901   CALCIUM 9.1 05/01/2023 0901   GFRNONAA 76 10/20/2020 0937   GFRAA 87 10/20/2020 0937   Lab Results  Component Value Date   HGBA1C 5.6 10/20/2020   HGBA1C 5.1 05/16/2017   No results found for: "INSULIN" CBC    Component Value Date/Time   WBC 6.5 05/01/2023 0901   RBC 4.14 05/01/2023 0901   HGB 12.0 05/01/2023 0901   HGB 12.3 06/30/2021 1036   HCT 36.6 05/01/2023 0901   HCT 36.7 06/30/2021 1036   PLT 360.0 05/01/2023 0901   PLT 334 06/30/2021 1036   MCV 88.4 05/01/2023 0901   MCV 87 06/30/2021 1036   MCH 29.1 06/30/2021 1036   MCH 31.4 06/10/2016 0601   MCHC 32.8 05/01/2023 0901   RDW 14.8 05/01/2023 0901   RDW 13.2 06/30/2021 1036   Iron/TIBC/Ferritin/ %Sat    Component Value Date/Time  IRON 38 (L) 05/01/2023 0901   TIBC 550.2 (H) 05/01/2023 0901   FERRITIN 10.4 05/01/2023 0901   IRONPCTSAT 6.9 (L) 05/01/2023 0901   Lipid Panel     Component Value Date/Time   CHOL 192 05/01/2023 0901   CHOL 227 (H) 06/30/2021 1036   TRIG 91.0 05/01/2023 0901   HDL 68.10 05/01/2023 0901   HDL 69 06/30/2021 1036   CHOLHDL 3 05/01/2023 0901   VLDL 18.2 05/01/2023 0901   LDLCALC 106 (H) 05/01/2023 0901   LDLCALC 143 (H) 06/30/2021 1036   Hepatic Function Panel     Component Value Date/Time   PROT 6.7 04/21/2022 0815   PROT 6.9 06/30/2021 1036   ALBUMIN 3.9 04/21/2022 0815   ALBUMIN 4.3 06/30/2021 1036   AST 13  04/21/2022 0815   ALT 10 04/21/2022 0815   ALKPHOS 62 04/21/2022 0815   BILITOT 0.3 04/21/2022 0815   BILITOT 0.2 06/30/2021 1036      Component Value Date/Time   TSH 2.06 04/21/2022 0815     Assessment and Plan:   Obesity (BMI 30-39.9), Starting BMI 39.9  Mixed hyperlipidemia  ESTABLISH WITH HWW   Obesity Treatment / Action Plan:  Patient will work on garnering support from family and friends to begin weight loss journey. Will work on eliminating or reducing the presence of highly palatable, calorie dense foods in the home. Will complete provided nutritional and psychosocial assessment questionnaire before the next appointment. Will be scheduled for indirect calorimetry to determine resting energy expenditure in a fasting state.  This will allow [place] to create a reduced calorie, high-protein meal plan to promote loss of fat mass while preserving muscle mass. Will think about ideas on how to incorporate physical activity into their daily routine. Will work on managing stress via relaxation methods as this may result in unhealthy eating patterns. Counseled on the health benefits of losing 5%-15% of total body weight. Was counseled on nutritional approaches to weight loss and benefits of reducing processed foods and consuming plant-based foods and high quality protein as part of nutritional weight management. Was counseled on pharmacotherapy and role as an adjunct in weight management.   Obesity Education Performed Today:  She was weighed on the bioimpedance scale and results were discussed and documented in the synopsis.  We discussed obesity as a disease and the importance of a more detailed evaluation of all the factors contributing to the disease.  We discussed the importance of long term lifestyle changes which include nutrition, exercise and behavioral modifications as well as the importance of customizing this to her specific health and social needs.  We discussed the  benefits of reaching a healthier weight to alleviate the symptoms of existing conditions and reduce the risks of the biomechanical, metabolic and psychological effects of obesity.  Parys Berroa appears to be in the action stage of change and states they are ready to start intensive lifestyle modifications and behavioral modifications.  30 minutes was spent today on this visit including the above counseling, pre-visit chart review, and post-visit documentation.  Reviewed by clinician on day of visit: allergies, medications, problem list, medical history, surgical history, family history, social history, and previous encounter notes pertinent to obesity diagnosis.   Worthy Rancher, MD

## 2023-06-04 ENCOUNTER — Ambulatory Visit
Admission: EM | Admit: 2023-06-04 | Discharge: 2023-06-04 | Disposition: A | Discharge status: Home / Self Care | Payer: Managed Care, Other (non HMO) | Attending: Emergency Medicine | Admitting: Emergency Medicine

## 2023-06-04 DIAGNOSIS — B029 Zoster without complications: Secondary | ICD-10-CM

## 2023-06-04 MED ORDER — VALACYCLOVIR HCL 1 G PO TABS: 1000.0000 mg | ORAL_TABLET | Freq: Three times a day (TID) | ORAL | 0 refills | Status: DC

## 2023-06-04 NOTE — ED Triage Notes (Signed)
Patient to Urgent Care with complaints of a painful rash present to her face/ hair line. Rash mostly on her right side. Also with complaints of right sided lymph node swelling. Appeared four days ago.  Has been using ibuprofen. No new products. No known allergen exposure.

## 2023-06-04 NOTE — ED Provider Notes (Signed)
Stacey Santiago    CSN: [id number] Arrival date & time: 06/04/23  7564      History   Chief Complaint Chief Complaint  Patient presents with   Rash    painful rash on face/hair - Entered by patient    HPI Stacey Santiago is a 42 y.o. female.  Presents with a painful rash on her right forehead and scalp x 4 days.  She reports right ear pain and painful swollen lymph nodes also.  Treatment attempted with ibuprofen and hydrocortisone cream.  She denies fever, sore throat, cough, shortness of breath, chest pain, or other symptoms.  [facility]; diagnosed with right ear pain; treated with ibuprofen.  The history is provided by the patient and medical records.    Past Medical History:  Diagnosis Date   Anxiety    Depression    GERD (gastroesophageal reflux disease)     Patient Active Problem List   Diagnosis Date Noted   BMI 40.0-44.9, adult (HCC) 05/01/2023   Encounter for general adult medical examination without abnormal findings 05/01/2023   Vitamin D deficiency 10/23/2022   Iron deficiency anemia due to chronic blood loss 10/23/2022   GAD (generalized anxiety disorder) 04/20/2022   Elevated lipids 04/20/2022    Past Surgical History:  Procedure Laterality Date   LAPAROSCOPIC APPENDECTOMY N/A 06/09/2016   Procedure: APPENDECTOMY LAPAROSCOPIC;  Surgeon: Ricarda Frame, MD;  Location: [facility] ORS;  Service: General;  Laterality: N/A;   WISDOM TOOTH EXTRACTION  19 yrs. of age    OB History     Gravida  2   Para  2   Term  2   Preterm      AB      Living  2      SAB      IAB      Ectopic      Multiple      Live Births  2            Home Medications    Prior to Admission medications   Medication Sig Start Date End Date Taking? Authorizing Provider  valACYclovir (VALTREX) 1000 MG tablet Take 1 tablet (1,000 mg total) by mouth 3 (three) times daily. 06/04/23  Yes Mickie Bail, NP  desogestrel-ethinyl estradiol  (APRI) 0.15-30 MG-MCG tablet Take 1 tablet by mouth daily. 05/01/23   Mort Sawyers, FNP  escitalopram (LEXAPRO) 10 MG tablet Take 1 tablet (10 mg total) by mouth daily. Please schedule office visit before any future refill. 05/01/23   Mort Sawyers, FNP    Family History Family History  Problem Relation Age of Onset   Diabetes Mother    Hypertension Mother    Hyperlipidemia Mother    Hypertension Father    Hyperlipidemia Father    Heart attack Maternal Grandmother        75   Diabetes Maternal Grandfather    Heart attack Maternal Grandfather        60's   Breast cancer Neg Hx     Social History Social History   Tobacco Use   Smoking status: Never   Smokeless tobacco: Never  Vaping Use   Vaping Use: Never used  Substance Use Topics   Alcohol use: Yes    Comment: social   Drug use: No     Allergies   Patient has no known allergies.   Review of Systems Review of Systems  Constitutional:  Negative for chills and fever.  HENT:  Positive for ear pain. Negative for sore throat.   Respiratory:  Negative for cough and shortness of breath.   Cardiovascular:  Negative for chest pain and palpitations.  Skin:  Positive for rash. Negative for color change.  All other systems reviewed and are negative.    Physical Exam Triage Vital Signs ED Triage Vitals  Enc Vitals Group     BP 06/04/23 0959 113/84     Pulse Rate 06/04/23 0949 94     Resp 06/04/23 0949 18     Temp 06/04/23 0949 98.8 F (37.1 C)     Temp src --      SpO2 06/04/23 0949 98 %     Weight --      Height --      Head Circumference --      Peak Flow --      Pain Score 06/04/23 0956 6     Pain Loc --      Pain Edu? --      Excl. in GC? --    No data found.  Updated Vital Signs BP 113/84   Pulse 94   Temp 98.8 F (37.1 C)   Resp 18   LMP 06/01/2023   SpO2 98%   Visual Acuity Right Eye Distance:   Left Eye Distance:   Bilateral Distance:    Right Eye Near:   Left Eye Near:    Bilateral  Near:     Physical Exam Vitals and nursing note reviewed.  Constitutional:      General: She is not in acute distress.    Appearance: She is well-developed.  HENT:     Right Ear: Tympanic membrane and ear canal normal.     Left Ear: Tympanic membrane and ear canal normal.     Nose: Nose normal.     Mouth/Throat:     Mouth: Mucous membranes are moist.     Pharynx: Oropharynx is clear.  Cardiovascular:     Rate and Rhythm: Normal rate and regular rhythm.     Heart sounds: Normal heart sounds.  Pulmonary:     Effort: Pulmonary effort is normal. No respiratory distress.     Breath sounds: Normal breath sounds.  Musculoskeletal:     Cervical back: Neck supple.  Skin:    General: Skin is warm and dry.     Findings: Rash present.     Comments: Clustered papular and vesicular rash on right forehead and hairline.   Neurological:     Mental Status: She is alert.  Psychiatric:        Mood and Affect: Mood normal.        Behavior: Behavior normal.      UC Treatments / Results  Labs (all labs ordered are listed, but only abnormal results are displayed) Labs Reviewed - No data to display  EKG   Radiology No results found.  Procedures Procedures (including critical care time)  Medications Ordered in UC Medications - No data to display  Initial Impression / Assessment and Plan / UC Course  I have reviewed the triage vital signs and the nursing notes.  Pertinent labs & imaging results that were available during my care of the patient were reviewed by me and considered in my medical decision making (see chart for details).    Herpes zoster.  Afebrile and vital signs are stable.  Treating with Valtrex.  Tylenol or ibuprofen as needed for discomfort.  Education provided on shingles.  Instructed patient to follow-up with  her PCP.  She agrees to plan of care.  Final Clinical Impressions(s) / UC Diagnoses   Final diagnoses:  Herpes zoster without complication     Discharge  Instructions      Take the Valtrex as directed.  Follow up with your primary care provider.        ED Prescriptions     Medication Sig Dispense Auth. Provider   valACYclovir (VALTREX) 1000 MG tablet Take 1 tablet (1,000 mg total) by mouth 3 (three) times daily. 21 tablet Mickie Bail, NP      PDMP not reviewed this encounter.   Mickie Bail, NP 06/04/23 1029

## 2023-06-04 NOTE — Discharge Instructions (Addendum)
Take the Valtrex as directed.  Follow up with your primary care provider.    

## 2023-06-05 ENCOUNTER — Ambulatory Visit: Payer: Managed Care, Other (non HMO) | Admitting: Family

## 2023-06-06 ENCOUNTER — Ambulatory Visit: Payer: Managed Care, Other (non HMO) | Admitting: Family Medicine

## 2023-06-06 ENCOUNTER — Encounter: Payer: Self-pay | Admitting: Family Medicine

## 2023-06-06 ENCOUNTER — Telehealth: Payer: Self-pay | Admitting: Family

## 2023-06-06 VITALS — BP 116/70 | HR 80 | Temp 97.6°F | Ht 65.0 in | Wt 243.0 lb

## 2023-06-06 DIAGNOSIS — B0230 Zoster ocular disease, unspecified: Secondary | ICD-10-CM

## 2023-06-06 MED ORDER — GABAPENTIN 300 MG PO CAPS: 300.0000 mg | ORAL_CAPSULE | Freq: Every day | ORAL | 0 refills | Status: DC

## 2023-06-06 NOTE — Progress Notes (Signed)
Ph: [phone] Fax: [phone]   Patient ID: Stacey Santiago, female    DOB: [birth year], 42 y.o.   MRN: [id number]  This visit was conducted in person.  BP 116/70   Pulse 80   Temp 97.6 F (36.4 C) (Temporal)   Ht 5\' 5"  (1.651 m)   Wt 243 lb (110.2 kg)   LMP 06/01/2023   SpO2 97%   BMI 40.44 kg/m    Vision Screening   Right eye Left eye Both eyes  Without correction 20/20 20/20 20/15   With correction        CC: shingles  Subjective:   HPI: Stacey Santiago is a 42 y.o. female presenting on 06/06/2023 for Herpes Zoster (Seen on 06/04/23 at [facility], dx shingles. C/o rash is spreading closer to R eye. )   Recent [facility] visit 06/04/2023 for R forehead and frontal scalp rash diagnosed with shingles treated with valtrex 1gm TID 7d course. Some persistent R preauricular tender LAD.  Symptoms started 06/02/2023 - with R ear pain.  This morning when she awoke noted R upper eyelid swelling.  Sharp pains to hairline.   No fevers/chills. No vision changes.  Ear pain is better.  H/o chicken pox growing up.  Recent increased stress - husband had stroke in May.   She is alternating tylenol 1000mg  alternating with ibuprofen 400mg  2-3 times a day with meals.   She does see [facility] but not recently.      Relevant past medical, surgical, family and social history reviewed and updated as indicated. Interim medical history since our last visit reviewed. Allergies and medications reviewed and updated. Outpatient Medications Prior to Visit  Medication Sig Dispense Refill   desogestrel-ethinyl estradiol (APRI) 0.15-30 MG-MCG tablet Take 1 tablet by mouth daily. 84 tablet 3   escitalopram (LEXAPRO) 10 MG tablet Take 1 tablet (10 mg total) by mouth daily. Please schedule office visit before any future refill. 90 tablet 3   valACYclovir (VALTREX) 1000 MG tablet Take 1 tablet (1,000 mg total) by mouth 3 (three) times daily. 21 tablet 0   No facility-administered medications prior to visit.     Per  HPI unless specifically indicated in ROS section below Review of Systems  Objective:  BP 116/70   Pulse 80   Temp 97.6 F (36.4 C) (Temporal)   Ht 5\' 5"  (1.651 m)   Wt 243 lb (110.2 kg)   LMP 06/01/2023   SpO2 97%   BMI 40.44 kg/m   Wt Readings from Last 3 Encounters:  06/06/23 243 lb (110.2 kg)  05/30/23 240 lb (108.9 kg)  05/01/23 242 lb 12.8 oz (110.1 kg)      Physical Exam Vitals and nursing note reviewed.  Constitutional:      Appearance: Normal appearance. She is not ill-appearing.  HENT:     Head: Normocephalic and atraumatic.      Comments: Papulovesicular rash to R forehead into R scalp involving R upper eyelid, mild erythema to R mid nasal bridge    Right Ear: Tympanic membrane, ear canal and external ear normal. There is no impacted cerumen.     Nose: Nose normal. No congestion.     Mouth/Throat:     Mouth: Mucous membranes are moist.     Pharynx: Oropharynx is clear. No oropharyngeal exudate or posterior oropharyngeal erythema.  Eyes:     General:        Right eye: No discharge.        Left eye: No discharge.  Extraocular Movements: Extraocular movements intact.     Conjunctiva/sclera: Conjunctivae normal.     Pupils: Pupils are equal, round, and reactive to light.     Comments: Mild R periorbital edema upper > lower eyelid, papule present to mid upper eyelid   Lymphadenopathy:     Head:     Right side of head: Preauricular (tender) adenopathy present. No submental, submandibular, tonsillar, posterior auricular or occipital adenopathy.     Left side of head: No submental, submandibular, tonsillar, preauricular, posterior auricular or occipital adenopathy.     Cervical: No cervical adenopathy.  Neurological:     Mental Status: She is alert.  Psychiatric:        Mood and Affect: Mood normal.        Behavior: Behavior normal.        Assessment & Plan:   Problem List Items Addressed This Visit     Herpes zoster ophthalmicus of right eye - Primary     Exam concerning for shingles affecting ophthalmic branch of trigeminal nerve. Today is 3rd day of valtrex antiviral. Woke up this morning with new eyelid swelling. Given location, distribution, I do want eye doctor to follow along - urgent referral placed to [facility] who she's previously seen.  Rec finish valtrex 1gm TID 7d course. Discussed alternating tylenol 1000mg , ibuprofen 600mg  (with meals) dosing. Rx printed for gabapentin 300mg  at night to fill if needed.  Snellen normal today.  Pt agrees with plan.       Relevant Medications   gabapentin (NEURONTIN) 300 MG capsule   Other Relevant Orders   Ambulatory referral to Ophthalmology     Meds ordered this encounter  Medications   gabapentin (NEURONTIN) 300 MG capsule    Sig: Take 1 capsule (300 mg total) by mouth at bedtime.    Dispense:  30 capsule    Refill:  0    Orders Placed This Encounter  Procedures   Ambulatory referral to Ophthalmology    Referral Priority:   Urgent    Referral Type:   Consultation    Referral Reason:   Specialty Services Required    Requested Specialty:   Ophthalmology    Number of Visits Requested:   1    Patient Instructions  Vision screen today I will refer you to [facility] to follow along.  Continue alternating tylenol 1000mg  with ibuprofen 600mg  with meals every 4 hours.  Finish valtrex course.  May try gabapentin 300mg  at night if needed for nerve pain.   Follow up plan: Return if symptoms worsen or fail to improve.  Eustaquio Boyden, MD

## 2023-06-06 NOTE — Telephone Encounter (Signed)
Trinity from [facility] contacted the office regarding patient, states they are needing office visit notes for this patient in anticipation for the patients appointment Friday. Fax- [phone]

## 2023-06-06 NOTE — Patient Instructions (Addendum)
Vision screen today I will refer you to [facility] to follow along.  Continue alternating tylenol 1000mg  with ibuprofen 600mg  with meals every 4 hours.  Finish valtrex course.  May try gabapentin 300mg  at night if needed for nerve pain.

## 2023-06-06 NOTE — Telephone Encounter (Signed)
Fax over office notes.

## 2023-06-06 NOTE — Assessment & Plan Note (Addendum)
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 Today is 3rd day of valtrex antiviral. Woke up this morning with new eyelid swelling. Given location, distribution, I do want eye doctor to follow along - urgent referral placed to [facility] who she's previously seen.  Rec finish valtrex 1gm TID 7d course. Discussed alternating tylenol 1000mg , ibuprofen 600mg  (with meals) dosing. Rx printed for gabapentin 300mg  at night to fill if needed.  Snellen normal today.  Pt agrees with plan.

## 2023-06-07 NOTE — Telephone Encounter (Signed)
Already faxed over the office notes when pt seen Dr. Reece Agar

## 2023-06-07 NOTE — Telephone Encounter (Signed)
Noted  

## 2023-06-07 NOTE — Telephone Encounter (Signed)
Does this mean you did fax over office notes? Or just asking for verbal ok?  If not already, please send requested notes, thanks!

## 2023-06-08 ENCOUNTER — Telehealth: Payer: Self-pay | Admitting: Family

## 2023-06-08 NOTE — Telephone Encounter (Signed)
Tresa Endo from [facility] received referral for patient.She called in today stating that she is not able to get in contact with patient to set her up for an appointment,as well as they are not able to see this patient due to unavailability. However they were trying to get in contact with her to let them speak to their triage line.

## 2023-06-12 ENCOUNTER — Ambulatory Visit (INDEPENDENT_AMBULATORY_CARE_PROVIDER_SITE_OTHER): Payer: Managed Care, Other (non HMO) | Admitting: Internal Medicine

## 2023-06-12 ENCOUNTER — Encounter (INDEPENDENT_AMBULATORY_CARE_PROVIDER_SITE_OTHER): Payer: Self-pay | Admitting: Internal Medicine

## 2023-06-12 VITALS — BP 118/78 | HR 59 | Temp 98.2°F | Ht 65.0 in | Wt 239.0 lb

## 2023-06-12 DIAGNOSIS — B0230 Zoster ocular disease, unspecified: Secondary | ICD-10-CM | POA: Diagnosis not present

## 2023-06-12 DIAGNOSIS — R0602 Shortness of breath: Secondary | ICD-10-CM | POA: Diagnosis not present

## 2023-06-12 DIAGNOSIS — Z6839 Body mass index (BMI) 39.0-39.9, adult: Secondary | ICD-10-CM

## 2023-06-12 DIAGNOSIS — E782 Mixed hyperlipidemia: Secondary | ICD-10-CM | POA: Diagnosis not present

## 2023-06-12 DIAGNOSIS — E668 Other obesity: Secondary | ICD-10-CM

## 2023-06-12 DIAGNOSIS — F32A Depression, unspecified: Secondary | ICD-10-CM | POA: Diagnosis not present

## 2023-06-12 DIAGNOSIS — E669 Obesity, unspecified: Secondary | ICD-10-CM

## 2023-06-12 DIAGNOSIS — R5383 Other fatigue: Secondary | ICD-10-CM | POA: Diagnosis not present

## 2023-06-12 DIAGNOSIS — Z1331 Encounter for screening for depression: Secondary | ICD-10-CM

## 2023-06-12 NOTE — Progress Notes (Signed)
Chief Complaint:   OBESITY Stacey Santiago (MR# [id number]) is a 42 y.o. female who presents for evaluation and treatment of obesity and related comorbidities. Current BMI is Body mass index is 39.77 kg/m. Stacey Santiago has been struggling with her weight for many years and has been unsuccessful in either losing weight, maintaining weight loss, or reaching her healthy weight goal.  Stacey Santiago is currently in the action stage of change and ready to dedicate time achieving and maintaining a healthier weight. Stacey Santiago is interested in becoming our patient and working on intensive lifestyle modifications including (but not limited to) diet and exercise for weight loss.  Stacey Santiago's habits were reviewed today and are as follows: Her family eats meals together, she thinks her family will eat healthier with her, her desired weight loss is 59 lbs, she started gaining weight about 2 years after having her son, her heaviest weight ever was 240 pounds, she has significant food cravings issues, she is frequently drinking liquids with calories, she frequently makes poor food choices, and she frequently eats larger portions than normal.  Depression Screen Stacey Santiago's Food and Mood (modified PHQ-9) score was 5.  Subjective:   1. Other fatigue Stacey Santiago admits to daytime somnolence and admits to waking up still tired. Patient has a history of symptoms of daytime fatigue and morning fatigue. Sorelle generally gets 7 or 8 hours of sleep per night, and states that she has generally restful sleep. Snoring is present. Apneic episodes are not present. Epworth Sleepiness Score is 5.   2. SOB (shortness of breath) on exertion Stacey Santiago notes increasing shortness of breath with exercising and seems to be worsening over time with weight gain. She notes getting out of breath sooner with activity than she used to. This has not gotten worse recently. Stacey Santiago denies shortness of breath at rest or orthopnea.  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  She has completed treatment with Valtrex.  She was evaluated by ophthalmologist.  4. Mixed hyperlipidemia Reviewed most recent lipid panel.  She had an LDL cholesterol 106 which is mild.  Based on cardiovascular risk she does not warrant statin therapy.   Assessment/Plan:   1. Other fatigue Stacey Santiago does feel that her weight is causing her energy to be lower than it should be. Fatigue may be related to obesity, depression or many other causes. Labs will be ordered, and in the meanwhile, Stacey Santiago will focus on self care including making healthy food choices, increasing physical activity and focusing on stress reduction.  - EKG 12-Lead - TSH - Vitamin B12 - Hemoglobin A1c - Insulin, random - Glucose, fasting  2. SOB (shortness of breath) on exertion Stacey Santiago does feel that she gets out of breath more easily that she used to when she exercises. Stacey Santiago's shortness of breath appears to be obesity related and exercise induced. She has agreed to work on weight loss and gradually increase exercise to treat her exercise induced shortness of breath. Will continue to monitor closely.  3. Herpes zoster ophthalmicus of right eye She has not needed to start gabapentin.  Discussed weight gain associated with gabapentin.  4. Mixed hyperlipidemia Losing 10% of body weight may improve cholesterol levels also reducing saturated fats to less than 10% of daily calories.  She will be working on reducing eating convenience foods.  5. Depression screening Stacey Santiago had a positive depression screening. Depression is commonly associated with obesity and often results in emotional eating behaviors. We will monitor this closely and work on  CBT to help improve the non-hunger eating patterns. Referral to Psychology may be required if no improvement is seen as she continues in our clinic.  6. Class 2 obesity without serious comorbidity with body mass index (BMI) of 39.0 to 39.9 in adult, unspecified obesity type -  Hemoglobin A1c - Insulin, random - Glucose, fasting  Stacey Santiago is currently in the action stage of change and her goal is to continue with weight loss efforts. I recommend Stacey Santiago begin the structured treatment plan as follows:  She has agreed to the Category 2 Plan + 100 calories.  Exercise goals: No exercise has been prescribed at this time.   Behavioral modification strategies: increasing lean protein intake, decreasing simple carbohydrates, increasing vegetables, increasing water intake, increasing high fiber foods, decreasing eating out, no skipping meals, meal planning and cooking strategies, keeping healthy foods in the home, better snacking choices, and planning for success.  She was informed of the importance of frequent follow-up visits to maximize her success with intensive lifestyle modifications for her multiple health conditions. She was informed we would discuss her lab results at her next visit unless there is a critical issue that needs to be addressed sooner. Stacey Santiago agreed to keep her next visit at the agreed upon time to discuss these results.  Objective:   Blood pressure 118/78, pulse (!) 59, temperature 98.2 F (36.8 C), height 5\' 5"  (1.651 m), weight 239 lb (108.4 kg), last menstrual period 06/01/2023, SpO2 100 %. Body mass index is 39.77 kg/m.  EKG: Normal sinus rhythm, rate 63 BPM.  Indirect Calorimeter completed today shows a VO2 of 270 and a REE of 1858.  Her calculated basal metabolic rate is 8119 thus her basal metabolic rate is worse than expected.  General: Cooperative, alert, well developed, in no acute distress. HEENT: Conjunctivae and lids unremarkable. Cardiovascular: Regular rhythm.  Lungs: Normal work of breathing. Neurologic: No focal deficits.   Lab Results  Component Value Date   CREATININE 0.94 05/01/2023   BUN 10 05/01/2023   NA 139 05/01/2023   K 4.1 05/01/2023   CL 105 05/01/2023   CO2 23 05/01/2023   Lab Results  Component Value Date   ALT  10 04/21/2022   AST 13 04/21/2022   ALKPHOS 62 04/21/2022   BILITOT 0.3 04/21/2022   Lab Results  Component Value Date   HGBA1C 5.6 10/20/2020   HGBA1C 5.5 10/21/2019   HGBA1C 5.1 05/16/2017   No results found for: "INSULIN" Lab Results  Component Value Date   TSH 2.06 04/21/2022   Lab Results  Component Value Date   CHOL 192 05/01/2023   HDL 68.10 05/01/2023   LDLCALC 106 (H) 05/01/2023   TRIG 91.0 05/01/2023   CHOLHDL 3 05/01/2023   Lab Results  Component Value Date   WBC 6.5 05/01/2023   HGB 12.0 05/01/2023   HCT 36.6 05/01/2023   MCV 88.4 05/01/2023   PLT 360.0 05/01/2023   Lab Results  Component Value Date   IRON 38 (L) 05/01/2023   TIBC 550.2 (H) 05/01/2023   FERRITIN 10.4 05/01/2023   Attestation Statements:   Reviewed by clinician on day of visit: allergies, medications, problem list, medical history, surgical history, family history, social history, and previous encounter notes.  Time spent on visit including pre-visit chart review and post-visit charting and care was 40 minutes.   Trude Mcburney, am acting as transcriptionist for Worthy Rancher, MD.  I have reviewed the above documentation for accuracy and completeness, and I agree with  the above. -Worthy Rancher, MD

## 2023-06-12 NOTE — Assessment & Plan Note (Signed)
Improved.  She has completed treatment with Valtrex.  She was evaluated by ophthalmologist.  She has not needed to start gabapentin.  Discussed weight gain associated with gabapentin

## 2023-06-12 NOTE — Assessment & Plan Note (Signed)
Reviewed most recent lipid panel.  She had an LDL cholesterol 106 which is mild.  Based on cardiovascular risk she does not warrant statin therapy.  Losing 10% of body weight may improve cholesterol levels also reducing saturated fats to less than 10% of daily calories.  She will be working on reducing eating convenience foods.

## 2023-06-13 LAB — TSH: TSH: 1.46 u[IU]/mL (ref 0.450–4.500)

## 2023-06-13 LAB — HEMOGLOBIN A1C
Est. average glucose Bld gHb Est-mCnc: 123 mg/dL
Hgb A1c MFr Bld: 5.9 % — ABNORMAL HIGH (ref 4.8–5.6)

## 2023-06-13 LAB — GLUCOSE, FASTING: Glucose, Plasma: 80 mg/dL (ref 70–99)

## 2023-06-13 LAB — INSULIN, RANDOM: INSULIN: 14.8 u[IU]/mL (ref 2.6–24.9)

## 2023-06-13 LAB — VITAMIN B12: Vitamin B-12: 189 pg/mL — ABNORMAL LOW (ref 232–1245)

## 2023-06-26 ENCOUNTER — Encounter (INDEPENDENT_AMBULATORY_CARE_PROVIDER_SITE_OTHER): Payer: Self-pay | Admitting: Internal Medicine

## 2023-06-26 ENCOUNTER — Ambulatory Visit (INDEPENDENT_AMBULATORY_CARE_PROVIDER_SITE_OTHER): Payer: Managed Care, Other (non HMO) | Admitting: Internal Medicine

## 2023-06-26 VITALS — BP 108/72 | HR 76 | Temp 98.4°F | Ht 65.0 in | Wt 235.0 lb

## 2023-06-26 DIAGNOSIS — E538 Deficiency of other specified B group vitamins: Secondary | ICD-10-CM | POA: Insufficient documentation

## 2023-06-26 DIAGNOSIS — E668 Other obesity: Secondary | ICD-10-CM | POA: Diagnosis not present

## 2023-06-26 DIAGNOSIS — E669 Obesity, unspecified: Secondary | ICD-10-CM

## 2023-06-26 DIAGNOSIS — R7303 Prediabetes: Secondary | ICD-10-CM | POA: Insufficient documentation

## 2023-06-26 DIAGNOSIS — Z6839 Body mass index (BMI) 39.0-39.9, adult: Secondary | ICD-10-CM

## 2023-06-26 DIAGNOSIS — E782 Mixed hyperlipidemia: Secondary | ICD-10-CM | POA: Diagnosis not present

## 2023-06-26 DIAGNOSIS — E66812 Obesity, class 2: Secondary | ICD-10-CM

## 2023-06-26 MED ORDER — VITAMIN B-12 1000 MCG PO TABS
1000.0000 ug | ORAL_TABLET | ORAL | Status: AC
Start: 2023-06-26 — End: ?

## 2023-06-26 NOTE — Assessment & Plan Note (Signed)
Most recent A1c is  Lab Results  Component Value Date   HGBA1C 5.9 (H) 06/12/2023   HGBA1C 5.1 05/16/2017    Patient aware of disease state and risk of progression. This may contribute to abnormal cravings, fatigue and diabetic complications without having diabetes.   We reviewed treatment options which includes losing 7 to 10% of body weight, increasing physical activity to a goal of 150 minutes a week at moderate intensity. She may also be a candidate for pharmacoprophylaxis with metformin or incretin mimetic.

## 2023-06-26 NOTE — Assessment & Plan Note (Signed)
Patient has B12 levels less than 200.  She is asymptomatic.  She will be started on cyanocobalamin 1000 mcg every other day.  We will check methylmalonic acid in 4 weeks.  CBC done in May was normal

## 2023-06-26 NOTE — Progress Notes (Signed)
Office: [phone]  /  Fax: [phone]  WEIGHT SUMMARY AND BIOMETRICS  Vitals Temp: 98.4 F (36.9 C) BP: 108/72 Pulse Rate: 76 SpO2: 96 %   Anthropometric Measurements Height: 5\' 5"  (1.651 m) Weight: 235 lb (106.6 kg) BMI (Calculated): 39.11 Weight at Last Visit: 239 lb Weight Lost Since Last Visit: 4 lb Weight Gained Since Last Visit: 0 Starting Weight: 239 lb Total Weight Loss (lbs): 4 lb (1.814 kg) Peak Weight: 242 lb   Body Composition  Body Fat %: 42.8 % Fat Mass (lbs): 100.6 lbs Muscle Mass (lbs): 127.6 lbs Total Body Water (lbs): 85.2 lbs Visceral Fat Rating : 11    No data recorded Today's Visit #: 2  Starting Date: 06/12/23   HPI  Chief Complaint: OBESITY  Stacey Santiago is here to discuss her progress with her obesity treatment plan. She is on the the Category 2 Plan and states she is following her eating plan approximately 50 % of the time. She states she is exercising walking 30 minutes 4 times per week.  Interval History:  Since last office visit she has lost 4 lbs. She reports fair adherence to reduced calorie nutritional plan. She has been working on not skipping meals, increasing protein intake at every meal, eating more fruits, eating more vegetables, drinking more water, avoiding and / or reducing liquid calories, avoiding or reducing simple and processed carbohydrates, making healthier choices, and eating out less  Orixegenic Control: Denies problems with appetite and hunger signals.  Denies problems with satiety and satiation.  Denies problems with eating patterns and portion control.  Denies abnormal cravings. Denies feeling deprived or restricted.   Barriers identified: strong hunger signals and appetite, having difficulty focusing on healthy eating, and exposure to enticing environments and/or relationships.   Pharmacotherapy for weight loss: She is currently taking no anti-obesity medication.    ASSESSMENT AND PLAN  TREATMENT PLAN  FOR OBESITY:  Recommended Dietary Goals  [name] is currently in the action stage of change. As such, her goal is to continue weight management plan. She has agreed to: continue current plan  Behavioral Intervention  We discussed the following Behavioral Modification Strategies today: increasing lean protein intake, decreasing simple carbohydrates , increasing vegetables, increasing lower glycemic fruits, increasing fiber rich foods, increasing water intake, work on meal planning and preparation, continue to practice mindfulness when eating, planning for success, and better snacking choices.  Additional resources provided today: Handout on increasing daily activity and exercise goal setting  Recommended Physical Activity Goals  [name] has been advised to work up to 150 minutes of moderate intensity aerobic activity a week and strengthening exercises 2-3 times per week for cardiovascular health, weight loss maintenance and preservation of muscle mass.   She has agreed to :  Think about ways to increase daily physical activity and overcoming barriers to exercise, Start strengthening exercises with a goal of 2-3 sessions a week , and Start aerobic activity with a goal of 150 minutes a week at moderate intensity.   Pharmacotherapy We discussed various medication options to help [name] with her weight loss efforts and we both agreed to : continue with nutritional and behavioral strategies.   ASSOCIATED CONDITIONS ADDRESSED TODAY  B12 deficiency Assessment & Plan: Patient has B12 levels less than 200.  She is asymptomatic.  She will be started on cyanocobalamin 1000 mcg every other day.  We will check methylmalonic acid in 4 weeks.  CBC done in May was normal  Orders: -     Vitamin  B-12; Take 1 tablet (1,000 mcg total) by mouth every other day.  Class 2 obesity without serious comorbidity with body mass index (BMI) of 39.0 to 39.9 in adult, unspecified obesity type  Prediabetes Assessment &  Plan: Most recent A1c is  Lab Results  Component Value Date   HGBA1C 5.9 (H) 06/12/2023   HGBA1C 5.1 05/16/2017    Patient aware of disease state and risk of progression. This may contribute to abnormal cravings, fatigue and diabetic complications without having diabetes.   We reviewed treatment options which includes losing 7 to 10% of body weight, increasing physical activity to a goal of 150 minutes a week at moderate intensity. She may also be a candidate for pharmacoprophylaxis with metformin or incretin mimetic.     Mixed hyperlipidemia Assessment & Plan: Reviewed most recent lipid panel.  Her LDL has decreased from 140 to 109 with dietary changes.  Her cardiovascular risk is low she does not warrant statin therapy.  Losing 10% of body weight may improve condition also reducing saturated fats to less than 10% of daily calories.     PHYSICAL EXAM:  Blood pressure 108/72, pulse 76, temperature 98.4 F (36.9 C), height 5\' 5"  (1.651 m), weight 235 lb (106.6 kg), last menstrual period 06/01/2023, SpO2 96%. Body mass index is 39.11 kg/m.  General: She is overweight, cooperative, alert, well developed, and in no acute distress. PSYCH: Has normal mood, affect and thought process.   HEENT: EOMI, sclerae are anicteric. Lungs: Normal breathing effort, no conversational dyspnea. Extremities: No edema.  Neurologic: No gross sensory or motor deficits. No tremors or fasciculations noted.    DIAGNOSTIC DATA REVIEWED:  BMET    Component Value Date/Time   NA 139 05/01/2023 0901   NA 141 06/30/2021 1036   K 4.1 05/01/2023 0901   CL 105 05/01/2023 0901   CO2 23 05/01/2023 0901   GLUCOSE 89 05/01/2023 0901   BUN 10 05/01/2023 0901   BUN 9 06/30/2021 1036   CREATININE 0.94 05/01/2023 0901   CALCIUM 9.1 05/01/2023 0901   GFRNONAA 76 10/20/2020 0937   GFRAA 87 10/20/2020 0937   Lab Results  Component Value Date   HGBA1C 5.9 (H) 06/12/2023   HGBA1C 5.1 05/16/2017   Lab Results   Component Value Date   INSULIN 14.8 06/12/2023   Lab Results  Component Value Date   TSH 1.460 06/12/2023   CBC    Component Value Date/Time   WBC 6.5 05/01/2023 0901   RBC 4.14 05/01/2023 0901   HGB 12.0 05/01/2023 0901   HGB 12.3 06/30/2021 1036   HCT 36.6 05/01/2023 0901   HCT 36.7 06/30/2021 1036   PLT 360.0 05/01/2023 0901   PLT 334 06/30/2021 1036   MCV 88.4 05/01/2023 0901   MCV 87 06/30/2021 1036   MCH 29.1 06/30/2021 1036   MCH 31.4 06/10/2016 0601   MCHC 32.8 05/01/2023 0901   RDW 14.8 05/01/2023 0901   RDW 13.2 06/30/2021 1036   Iron Studies    Component Value Date/Time   IRON 38 (L) 05/01/2023 0901   TIBC 550.2 (H) 05/01/2023 0901   FERRITIN 10.4 05/01/2023 0901   IRONPCTSAT 6.9 (L) 05/01/2023 0901   Lipid Panel     Component Value Date/Time   CHOL 192 05/01/2023 0901   CHOL 227 (H) 06/30/2021 1036   TRIG 91.0 05/01/2023 0901   HDL 68.10 05/01/2023 0901   HDL 69 06/30/2021 1036   CHOLHDL 3 05/01/2023 0901   VLDL 18.2 05/01/2023 0901  LDLCALC 106 (H) 05/01/2023 0901   LDLCALC 143 (H) 06/30/2021 1036   Hepatic Function Panel     Component Value Date/Time   PROT 6.7 04/21/2022 0815   PROT 6.9 06/30/2021 1036   ALBUMIN 3.9 04/21/2022 0815   ALBUMIN 4.3 06/30/2021 1036   AST 13 04/21/2022 0815   ALT 10 04/21/2022 0815   ALKPHOS 62 04/21/2022 0815   BILITOT 0.3 04/21/2022 0815   BILITOT 0.2 06/30/2021 1036      Component Value Date/Time   TSH 1.460 06/12/2023 0931   Nutritional Lab Results  Component Value Date   VD25OH 40.23 05/01/2023   VD25OH 45.21 10/23/2022   VD25OH 26.28 (L) 04/21/2022     Return in about 2 weeks (around 07/10/2023) for For Weight Mangement with Dr. Rikki Spearing.Marland Kitchen She was informed of the importance of frequent follow up visits to maximize her success with intensive lifestyle modifications for her multiple health conditions.   ATTESTASTION STATEMENTS:  Reviewed by clinician on day of visit: allergies,  medications, problem list, medical history, surgical history, family history, social history, and previous encounter notes.   I have spent 40 minutes in the care of the patient today including: preparing to see patient (e.g. review and interpretation of tests, old notes ), counseling and educating the patient, documenting clinical information in the electronic or other health care record, and independently interpreting results and communicating results to the patient,family, or caregiver  Worthy Rancher, MD

## 2023-06-26 NOTE — Assessment & Plan Note (Signed)
Reviewed most recent lipid panel.  Her LDL has decreased from 140 to 109 with dietary changes.  Her cardiovascular risk is low she does not warrant statin therapy.  Losing 10% of body weight may improve condition also reducing saturated fats to less than 10% of daily calories.

## 2023-07-23 ENCOUNTER — Ambulatory Visit (INDEPENDENT_AMBULATORY_CARE_PROVIDER_SITE_OTHER): Payer: Managed Care, Other (non HMO) | Admitting: Internal Medicine

## 2023-07-23 ENCOUNTER — Encounter (INDEPENDENT_AMBULATORY_CARE_PROVIDER_SITE_OTHER): Payer: Self-pay | Admitting: Internal Medicine

## 2023-07-23 VITALS — BP 110/82 | HR 78 | Temp 98.7°F | Ht 65.0 in | Wt 238.0 lb

## 2023-07-23 DIAGNOSIS — E538 Deficiency of other specified B group vitamins: Secondary | ICD-10-CM

## 2023-07-23 DIAGNOSIS — R7303 Prediabetes: Secondary | ICD-10-CM

## 2023-07-23 DIAGNOSIS — Z6839 Body mass index (BMI) 39.0-39.9, adult: Secondary | ICD-10-CM | POA: Diagnosis not present

## 2023-07-23 DIAGNOSIS — E669 Obesity, unspecified: Secondary | ICD-10-CM

## 2023-07-23 DIAGNOSIS — E668 Other obesity: Secondary | ICD-10-CM | POA: Diagnosis not present

## 2023-07-23 NOTE — Progress Notes (Signed)
Office: [phone]  /  Fax: [phone]  WEIGHT SUMMARY AND BIOMETRICS  Vitals Temp: 98.7 F (37.1 C) BP: 110/82 Pulse Rate: 78 SpO2: 96 %   Anthropometric Measurements Height: 5\' 5"  (1.651 m) Weight: 238 lb (108 kg) BMI (Calculated): 39.61 Weight at Last Visit: 235 lb Weight Lost Since Last Visit: 0 lb Weight Gained Since Last Visit: 3 lb Starting Weight: 239 lb Total Weight Loss (lbs): 1 lb (0.454 kg) Peak Weight: 242 lb   Body Composition  Body Fat %: 42.1 % Fat Mass (lbs): 100.6 lbs Muscle Mass (lbs): 131.2 lbs Total Body Water (lbs): 88.8 lbs Visceral Fat Rating : 11    No data recorded Today's Visit #: 3  No data recorded  HPI  Chief Complaint: OBESITY  Stacey Santiago is here to discuss her progress with her obesity treatment plan. She is on the the Category 2 Plan and states she is following her eating plan approximately 70 % of the time. She states she is exercising 30 minutes 3 times per week.  Interval History:  Since last office visit she has gained 4 lbs, was on vacation. She reports had a recent lapse due to vacationing or traveling She has been working on not skipping meals, avoiding and / or reducing liquid calories, working on gradual implementation of reduced calorie nutrition plan, continues to exercise, eating out less, working on meal prepping, reducing portion sizes, and getting back on track following recent lapse    Barriers identified:  Children like to eat out . Travel  Pharmacotherapy for weight loss: She is currently taking no anti-obesity medication.    ASSESSMENT AND PLAN  TREATMENT PLAN FOR OBESITY:  Recommended Dietary Goals  Kaarin is currently in the action stage of change. As such, her goal is to continue weight management plan. She has agreed to: continue to work on implementation of reduced calorie nutrition plan (RCNP)  Behavioral Intervention  We discussed the following Behavioral Modification Strategies today:  increasing lean protein intake, decreasing simple carbohydrates , increasing vegetables, increasing lower glycemic fruits, increasing fiber rich foods, increasing water intake, avoiding temptations and identifying enticing environmental cues, continue to practice mindfulness when eating, and planning for success.  Additional resources provided today: Handout on popular protein drinks  and Handout on how to make protein smoothies  Recommended Physical Activity Goals  Faustina has been advised to work up to 150 minutes of moderate intensity aerobic activity a week and strengthening exercises 2-3 times per week for cardiovascular health, weight loss maintenance and preservation of muscle mass.   She has agreed to :  Increase volume of physical   Pharmacotherapy We discussed various medication options to help Lovene with her weight loss efforts and we both agreed to : continue with nutritional and behavioral strategies.  She would look into coverage for GLP-1 therapy via her insurance.  ASSOCIATED CONDITIONS ADDRESSED TODAY  Class 2 obesity without serious comorbidity with body mass index (BMI) of 39.0 to 39.9 in adult, unspecified obesity type  Prediabetes Assessment & Plan: Most recent A1c is  Lab Results  Component Value Date   HGBA1C 5.9 (H) 06/12/2023   HGBA1C 5.1 05/16/2017    Patient aware of disease state and risk of progression. This may contribute to abnormal cravings, fatigue and diabetic complications without having diabetes.   She has cut down on simple and added sugars.  And will continue to work on implementing reduced calorie nutrition plan.  She may also be a candidate for pharmacoprophylaxis with metformin  or incretin mimetic.     B12 deficiency Assessment & Plan: On B12 supplementation without any adverse effects.  Continue B12 check B12 and methylmalonic acid at the next office visit.     PHYSICAL EXAM:  Blood pressure 110/82, pulse 78, temperature 98.7 F  (37.1 C), height 5\' 5"  (1.651 m), weight 238 lb (108 kg), SpO2 96%. Body mass index is 39.61 kg/m.  General: She is overweight, cooperative, alert, well developed, and in no acute distress. PSYCH: Has normal mood, affect and thought process.   HEENT: EOMI, sclerae are anicteric. Lungs: Normal breathing effort, no conversational dyspnea. Extremities: No edema.  Neurologic: No gross sensory or motor deficits. No tremors or fasciculations noted.    DIAGNOSTIC DATA REVIEWED:  BMET    Component Value Date/Time   NA 139 05/01/2023 0901   NA 141 06/30/2021 1036   K 4.1 05/01/2023 0901   CL 105 05/01/2023 0901   CO2 23 05/01/2023 0901   GLUCOSE 89 05/01/2023 0901   BUN 10 05/01/2023 0901   BUN 9 06/30/2021 1036   CREATININE 0.94 05/01/2023 0901   CALCIUM 9.1 05/01/2023 0901   GFRNONAA 76 10/20/2020 0937   GFRAA 87 10/20/2020 0937   Lab Results  Component Value Date   HGBA1C 5.9 (H) 06/12/2023   HGBA1C 5.1 05/16/2017   Lab Results  Component Value Date   INSULIN 14.8 06/12/2023   Lab Results  Component Value Date   TSH 1.460 06/12/2023   CBC    Component Value Date/Time   WBC 6.5 05/01/2023 0901   RBC 4.14 05/01/2023 0901   HGB 12.0 05/01/2023 0901   HGB 12.3 06/30/2021 1036   HCT 36.6 05/01/2023 0901   HCT 36.7 06/30/2021 1036   PLT 360.0 05/01/2023 0901   PLT 334 06/30/2021 1036   MCV 88.4 05/01/2023 0901   MCV 87 06/30/2021 1036   MCH 29.1 06/30/2021 1036   MCH 31.4 06/10/2016 0601   MCHC 32.8 05/01/2023 0901   RDW 14.8 05/01/2023 0901   RDW 13.2 06/30/2021 1036   Iron Studies    Component Value Date/Time   IRON 38 (L) 05/01/2023 0901   TIBC 550.2 (H) 05/01/2023 0901   FERRITIN 10.4 05/01/2023 0901   IRONPCTSAT 6.9 (L) 05/01/2023 0901   Lipid Panel     Component Value Date/Time   CHOL 192 05/01/2023 0901   CHOL 227 (H) 06/30/2021 1036   TRIG 91.0 05/01/2023 0901   HDL 68.10 05/01/2023 0901   HDL 69 06/30/2021 1036   CHOLHDL 3 05/01/2023 0901    VLDL 18.2 05/01/2023 0901   LDLCALC 106 (H) 05/01/2023 0901   LDLCALC 143 (H) 06/30/2021 1036   Hepatic Function Panel     Component Value Date/Time   PROT 6.7 04/21/2022 0815   PROT 6.9 06/30/2021 1036   ALBUMIN 3.9 04/21/2022 0815   ALBUMIN 4.3 06/30/2021 1036   AST 13 04/21/2022 0815   ALT 10 04/21/2022 0815   ALKPHOS 62 04/21/2022 0815   BILITOT 0.3 04/21/2022 0815   BILITOT 0.2 06/30/2021 1036      Component Value Date/Time   TSH 1.460 06/12/2023 0931   Nutritional Lab Results  Component Value Date   VD25OH 40.23 05/01/2023   VD25OH 45.21 10/23/2022   VD25OH 26.28 (L) 04/21/2022     Return in about 3 weeks (around 08/13/2023) for For Weight Mangement with Dr. Rikki Spearing.Marland Kitchen She was informed of the importance of frequent follow up visits to maximize her success with intensive lifestyle modifications for her  multiple health conditions.   ATTESTASTION STATEMENTS:  Reviewed by clinician on day of visit: allergies, medications, problem list, medical history, surgical history, family history, social history, and previous encounter notes.   I have spent 30 minutes in the care of the patient today including: preparing to see patient (e.g. review and interpretation of tests, old notes ), counseling and educating the patient, documenting clinical information in the electronic or other health care record, and independently interpreting results and communicating results to the patient,family, or caregiver   Worthy Rancher, MD

## 2023-07-23 NOTE — Assessment & Plan Note (Signed)
Most recent A1c is  Lab Results  Component Value Date   HGBA1C 5.9 (H) 06/12/2023   HGBA1C 5.1 05/16/2017    Patient aware of disease state and risk of progression. This may contribute to abnormal cravings, fatigue and diabetic complications without having diabetes.   She has cut down on simple and added sugars.  And will continue to work on implementing reduced calorie nutrition plan.  She may also be a candidate for pharmacoprophylaxis with metformin or incretin mimetic.

## 2023-07-23 NOTE — Assessment & Plan Note (Signed)
On B12 supplementation without any adverse effects.  Continue B12 check B12 and methylmalonic acid at the next office visit.

## 2023-08-16 ENCOUNTER — Encounter (INDEPENDENT_AMBULATORY_CARE_PROVIDER_SITE_OTHER): Payer: Self-pay | Admitting: Internal Medicine

## 2023-08-16 ENCOUNTER — Ambulatory Visit (INDEPENDENT_AMBULATORY_CARE_PROVIDER_SITE_OTHER): Payer: Managed Care, Other (non HMO) | Admitting: Internal Medicine

## 2023-08-16 DIAGNOSIS — R638 Other symptoms and signs concerning food and fluid intake: Secondary | ICD-10-CM | POA: Diagnosis not present

## 2023-08-16 DIAGNOSIS — R7303 Prediabetes: Secondary | ICD-10-CM

## 2023-08-16 DIAGNOSIS — Z6839 Body mass index (BMI) 39.0-39.9, adult: Secondary | ICD-10-CM

## 2023-08-16 DIAGNOSIS — E668 Other obesity: Secondary | ICD-10-CM

## 2023-08-16 DIAGNOSIS — E669 Obesity, unspecified: Secondary | ICD-10-CM

## 2023-08-16 MED ORDER — ZEPBOUND 2.5 MG/0.5ML ~~LOC~~ SOAJ
2.5000 mg | SUBCUTANEOUS | 0 refills | Status: DC
Start: 2023-08-16 — End: 2023-10-11

## 2023-08-16 NOTE — Progress Notes (Signed)
Office: [phone]  /  Fax: [phone]  WEIGHT SUMMARY AND BIOMETRICS  Vitals Temp: 98.1 F (37.1 C) BP: 107/73 Pulse Rate: 67 SpO2: 100 %     Anthropometric Measurements Height: 5\' 5"  (1.651 m) Weight: 236 lb (108 kg) BMI (Calculated): 39.27 Weight at Last Visit: 238 lb Weight Lost Since Last Visit: 2 lb Weight Gained Since Last Visit: 0 lb lb Starting Weight: 239 lb Total Weight Loss (lbs): 3 lb (0.454 kg) Peak Weight: 242 lb     Body Composition  Body Fat %: 44 % Fat Mass (lbs): 104 lbs Muscle Mass (lbs): 125.6 lbs Total Body Water (lbs): 86 lbs Visceral Fat Rating : 12  HPI  Chief Complaint: OBESITY  Stacey Santiago is here to discuss her progress with her obesity treatment plan. Stacey Santiago is on the the Category 2 Plan and states Stacey Santiago is following her eating plan approximately 80 % of the time. Stacey Santiago states Stacey Santiago is exercising 20 minutes 3 times per week.  Interval History:  Since last office visit Stacey Santiago has lost 2 pounds. Stacey Santiago reports good adherence to reduced calorie nutritional plan. Stacey Santiago has been working on not skipping meals, increasing protein intake at every meal, eating more fruits, eating more vegetables, drinking more water, working on gradual implementation of reduced calorie nutrition plan, and continues to exercise  Orexigenic Control: Reports problems with appetite and hunger signals.  Reports problems with satiety and satiation.  Denies problems with eating patterns and portion control.  Reports abnormal cravings. Denies feeling deprived or restricted.   Barriers identified: strong hunger signals and appetite and moderate to high levels of stress.   Pharmacotherapy for weight loss: Stacey Santiago is currently taking no anti-obesity medication.    ASSESSMENT AND PLAN  TREATMENT PLAN FOR OBESITY:  Recommended Dietary Goals  Stacey Santiago is currently in the action stage of change. As such, her goal is to continue weight management plan. Stacey Santiago has agreed to: continue current  plan  Behavioral Intervention  We discussed the following Behavioral Modification Strategies today: increasing lean protein intake, decreasing simple carbohydrates , increasing vegetables, increasing lower glycemic fruits, increasing fiber rich foods, increasing water intake, continue to practice mindfulness when eating, and planning for success.  Additional resources provided today: None  Recommended Physical Activity Goals  Stacey Santiago has been advised to work up to 150 minutes of moderate intensity aerobic activity a week and strengthening exercises 2-3 times per week for cardiovascular health, weight loss maintenance and preservation of muscle mass.   Stacey Santiago has agreed to :  Think about ways to increase daily physical activity and overcoming barriers to exercise and Increase physical activity in their day and reduce sedentary time (increase NEAT).  Pharmacotherapy We discussed various medication options to help Stacey Santiago with her weight loss efforts and we both agreed to : start anti-obesity medication.  In addition to reduced calorie nutrition plan (RCNP), behavioral strategies and physical activity, Stacey Santiago would benefit from pharmacotherapy to assist with hunger signals, satiety and cravings. This will reduce obesity-related health risks by inducing weight loss, and help reduce food consumption and adherence to Stacey Santiago) . It may also improve QOL by improving self-confidence and reduce the  setbacks associated with metabolic adaptations.  After discussion of treatment options, mechanisms of action, benefits, side effects, contraindications and shared decision making Stacey Santiago is agreeable to starting Zepbound 2.5 mg once weekly. Patient also made aware that medication is indicated for long-term management of obesity and the risk of weight regain following discontinuation of treatment and hence the importance  of adhering to medical weight loss plan.  We demonstrated use of device and patient using teach back method  was able to demonstrate proper technique.  ASSOCIATED CONDITIONS ADDRESSED TODAY  Class 2 obesity without serious comorbidity with body mass index (BMI) of 39.0 to 39.9 in adult, unspecified obesity type Assessment & Plan: See obesity treatment plan  Orders: -     Zepbound; Inject 2.5 mg into the skin once a week.  Dispense: 2 mL; Refill: 0  Prediabetes Assessment & Plan: Most recent A1c is  Lab Results  Component Value Date   HGBA1C 5.9 (H) 06/12/2023   HGBA1C 5.1 05/16/2017    Patient aware of disease state and risk of progression. This may contribute to abnormal cravings, fatigue and diabetic complications without having diabetes.   Stacey Santiago has cut down on simple and added sugars.  And will continue to work on implementing reduced calorie nutrition plan.  After discussion of benefits and side effect Stacey Santiago will be started on Zepbound 2.5 mg once a week for pharmacoprophylaxis as well as the management of obesity.   Orders: -     Zepbound; Inject 2.5 mg into the skin once a week.  Dispense: 2 mL; Refill: 0  Abnormal food appetite Assessment & Plan: Stacey Santiago has increased orexigenic signaling, impaired satiety and inhibitory control. This is secondary to an abnormal energy regulation system and pathological neurohormonal pathways characteristic of excess adiposity.  In addition to nutritional and behavioral strategies Stacey Santiago benefits from pharmacotherapy.       PHYSICAL EXAM:  Blood pressure (P) 107/73, pulse (P) 67, temperature (P) 98.1 F (36.7 C), height (P) 5\' 5"  (1.651 m), weight (P) 236 lb (107 kg), last menstrual period 07/27/2023, SpO2 (P) 100%. Body mass index is 39.27 kg/m (pended).  General: Stacey Santiago is overweight, cooperative, alert, well developed, and in no acute distress. PSYCH: Has normal mood, affect and thought process.   HEENT: EOMI, sclerae are anicteric. Lungs: Normal breathing effort, no conversational dyspnea. Extremities: No edema.  Neurologic: No gross sensory  or motor deficits. No tremors or fasciculations noted.    DIAGNOSTIC DATA REVIEWED:  BMET    Component Value Date/Time   NA 139 05/01/2023 0901   NA 141 06/30/2021 1036   K 4.1 05/01/2023 0901   CL 105 05/01/2023 0901   CO2 23 05/01/2023 0901   GLUCOSE 89 05/01/2023 0901   BUN 10 05/01/2023 0901   BUN 9 06/30/2021 1036   CREATININE 0.94 05/01/2023 0901   CALCIUM 9.1 05/01/2023 0901   GFRNONAA 76 10/20/2020 0937   GFRAA 87 10/20/2020 0937   Lab Results  Component Value Date   HGBA1C 5.9 (H) 06/12/2023   HGBA1C 5.1 05/16/2017   Lab Results  Component Value Date   INSULIN 14.8 06/12/2023   Lab Results  Component Value Date   TSH 1.460 06/12/2023   CBC    Component Value Date/Time   WBC 6.5 05/01/2023 0901   RBC 4.14 05/01/2023 0901   HGB 12.0 05/01/2023 0901   HGB 12.3 06/30/2021 1036   HCT 36.6 05/01/2023 0901   HCT 36.7 06/30/2021 1036   PLT 360.0 05/01/2023 0901   PLT 334 06/30/2021 1036   MCV 88.4 05/01/2023 0901   MCV 87 06/30/2021 1036   MCH 29.1 06/30/2021 1036   MCH 31.4 06/10/2016 0601   MCHC 32.8 05/01/2023 0901   RDW 14.8 05/01/2023 0901   RDW 13.2 06/30/2021 1036   Iron Studies    Component Value Date/Time   IRON 38 (  L) 05/01/2023 0901   TIBC 550.2 (H) 05/01/2023 0901   FERRITIN 10.4 05/01/2023 0901   IRONPCTSAT 6.9 (L) 05/01/2023 0901   Lipid Panel     Component Value Date/Time   CHOL 192 05/01/2023 0901   CHOL 227 (H) 06/30/2021 1036   TRIG 91.0 05/01/2023 0901   HDL 68.10 05/01/2023 0901   HDL 69 06/30/2021 1036   CHOLHDL 3 05/01/2023 0901   VLDL 18.2 05/01/2023 0901   LDLCALC 106 (H) 05/01/2023 0901   LDLCALC 143 (H) 06/30/2021 1036   Hepatic Function Panel     Component Value Date/Time   PROT 6.7 04/21/2022 0815   PROT 6.9 06/30/2021 1036   ALBUMIN 3.9 04/21/2022 0815   ALBUMIN 4.3 06/30/2021 1036   AST 13 04/21/2022 0815   ALT 10 04/21/2022 0815   ALKPHOS 62 04/21/2022 0815   BILITOT 0.3 04/21/2022 0815   BILITOT 0.2  06/30/2021 1036      Component Value Date/Time   TSH 1.460 06/12/2023 0931   Nutritional Lab Results  Component Value Date   VD25OH 40.23 05/01/2023   VD25OH 45.21 10/23/2022   VD25OH 26.28 (L) 04/21/2022     Return for Week of Sept 30th - 4 weeks - with Dr. Rikki Spearing.Marland Kitchen Stacey Santiago was informed of the importance of frequent follow up visits to maximize her success with intensive lifestyle modifications for her multiple health conditions.   ATTESTASTION STATEMENTS:  Reviewed by clinician on day of visit: allergies, medications, problem list, medical history, surgical history, family history, social history, and previous encounter notes.     Worthy Rancher, MD

## 2023-08-17 NOTE — Assessment & Plan Note (Signed)
 See obesity treatment plan

## 2023-08-17 NOTE — Assessment & Plan Note (Signed)
Most recent A1c is  Lab Results  Component Value Date   HGBA1C 5.9 (H) 06/12/2023   HGBA1C 5.1 05/16/2017    Patient aware of disease state and risk of progression. This may contribute to abnormal cravings, fatigue and diabetic complications without having diabetes.   She has cut down on simple and added sugars.  And will continue to work on implementing reduced calorie nutrition plan.  After discussion of benefits and side effect she will be started on Zepbound 2.5 mg once a week for pharmacoprophylaxis as well as the management of obesity.

## 2023-08-17 NOTE — Assessment & Plan Note (Signed)
She has increased orexigenic signaling, impaired satiety and inhibitory control. This is secondary to an abnormal energy regulation system and pathological neurohormonal pathways characteristic of excess adiposity.  In addition to nutritional and behavioral strategies she benefits from pharmacotherapy.

## 2023-08-20 ENCOUNTER — Telehealth (INDEPENDENT_AMBULATORY_CARE_PROVIDER_SITE_OTHER): Payer: Self-pay

## 2023-08-20 ENCOUNTER — Encounter (INDEPENDENT_AMBULATORY_CARE_PROVIDER_SITE_OTHER): Payer: Self-pay | Admitting: Internal Medicine

## 2023-08-20 NOTE — Telephone Encounter (Signed)
PA for Zepbound started 

## 2023-08-21 ENCOUNTER — Encounter (INDEPENDENT_AMBULATORY_CARE_PROVIDER_SITE_OTHER): Payer: Self-pay

## 2023-08-21 NOTE — Telephone Encounter (Signed)
Zepbound approved

## 2023-09-20 ENCOUNTER — Encounter (INDEPENDENT_AMBULATORY_CARE_PROVIDER_SITE_OTHER): Payer: Self-pay | Admitting: Internal Medicine

## 2023-09-20 ENCOUNTER — Ambulatory Visit (INDEPENDENT_AMBULATORY_CARE_PROVIDER_SITE_OTHER): Payer: Managed Care, Other (non HMO) | Admitting: Internal Medicine

## 2023-09-20 VITALS — BP 113/80 | HR 71 | Temp 99.0°F | Ht 65.0 in | Wt 232.0 lb

## 2023-09-20 DIAGNOSIS — R7303 Prediabetes: Secondary | ICD-10-CM

## 2023-09-20 DIAGNOSIS — Z3009 Encounter for other general counseling and advice on contraception: Secondary | ICD-10-CM | POA: Insufficient documentation

## 2023-09-20 DIAGNOSIS — Z6839 Body mass index (BMI) 39.0-39.9, adult: Secondary | ICD-10-CM | POA: Diagnosis not present

## 2023-09-20 DIAGNOSIS — R638 Other symptoms and signs concerning food and fluid intake: Secondary | ICD-10-CM | POA: Diagnosis not present

## 2023-09-20 DIAGNOSIS — E66812 Obesity, class 2: Secondary | ICD-10-CM | POA: Diagnosis not present

## 2023-09-20 NOTE — Progress Notes (Signed)
Office: [phone]  /  Fax: [phone]  WEIGHT SUMMARY AND BIOMETRICS  Vitals Temp: 99 F (37.2 C) BP: 113/80 Pulse Rate: 71 SpO2: 99 %   Anthropometric Measurements Height: 5\' 5"  (1.651 m) Weight: 232 lb (105.2 kg) BMI (Calculated): 38.61 Weight at Last Visit: 236 lb Weight Lost Since Last Visit: 4 lb Weight Gained Since Last Visit: 0 lb Starting Weight: 239 lb Total Weight Loss (lbs): 7 lb (3.175 kg) Peak Weight: 242 lb   Body Composition  Body Fat %: 42.4 % Fat Mass (lbs): 98.8 lbs Muscle Mass (lbs): 127.2 lbs Total Body Water (lbs): 86.6 lbs Visceral Fat Rating : 11    No data recorded Today's Visit #: 5  Starting Date: 06/12/23   HPI  Chief Complaint: OBESITY  Stacey Santiago is here to discuss her progress with her obesity treatment plan. She is on the the Category 2 Plan and states she is following her eating plan approximately 60 % of the time. She states she is exercising 30 minutes 4 times per week.  Discussed the use of AI scribe software for clinical note transcription with the patient, who gave verbal consent to proceed.  History of Present Illness   The patient, with a history of obesity and prediabetes, presents for a follow-up visit. She reports adherence to a category two diet plan approximately 60% of the time and regular exercise, consisting of 30-minute sessions four times per week. Since the last visit, the patient has lost four pounds.  The patient was prescribed Zepbound at the last visit but has not yet started the medication due to concerns about potential side effects during a recent trip to [place]. Additionally, the patient expressed hesitation after learning from a pharmacist that the medication could potentially interfere with her birth control, leading to an increased risk of pregnancy.  The patient has made significant dietary changes, including eliminating sodas from her diet and opting for water at restaurants. She has also been more  mindful of her food choices, opting for protein shakes in the morning and incorporating more vegetables into her meals.  The patient also mentioned a change in her insurance plan in the near future, which may affect her ability to continue with setbound due to potential changes in coverage. She plans to inquire about this during an upcoming open forum for questions about the new insurance plan.  The patient has two children and has expressed a strong desire not to have more. She is considering discussing alternative contraception methods, such as an intrauterine device (IUD), with her gynecologist.  In summary, the patient is making significant strides in managing her obesity through dietary changes and regular exercise. However, she has not yet started the prescribed setbound due to concerns about potential side effects and interference with her birth control. The upcoming change in her insurance plan may also impact her ability to continue with this medication.       Pharmacotherapy for weight loss: She is currently taking  she has Zepbound in her possession but has not started medication yet .    ASSESSMENT AND PLAN  TREATMENT PLAN FOR OBESITY:  Recommended Dietary Goals  Emryn is currently in the action stage of change. As such, her goal is to continue weight management plan. She has agreed to: continue current plan  Behavioral Intervention  We discussed the following Behavioral Modification Strategies today: continue to work on maintaining a reduced calorie state, getting the recommended amount of protein, incorporating whole foods, making healthy choices, staying  well hydrated and practicing mindfulness when eating..  Additional resources provided today: None  Recommended Physical Activity Goals  Lakeyshia has been advised to work up to 150 minutes of moderate intensity aerobic activity a week and strengthening exercises 2-3 times per week for cardiovascular health, weight loss  maintenance and preservation of muscle mass.   She has agreed to :  Think about enjoyable ways to increase daily physical activity and overcoming barriers to exercise and Increase physical activity in their day and reduce sedentary time (increase NEAT).  Pharmacotherapy We discussed various medication options to help Yeny with her weight loss efforts and we both agreed to : continue with nutritional and behavioral strategies  ASSOCIATED CONDITIONS ADDRESSED TODAY  Abnormal food appetite Assessment & Plan: Stable.  She has increased orexigenic signaling, impaired satiety and inhibitory control. This is secondary to an abnormal energy regulation system and pathological neurohormonal pathways characteristic of excess adiposity.  In addition to nutritional and behavioral strategies she benefits from pharmacotherapy.     Class 2 obesity without serious comorbidity with body mass index (BMI) of 39.0 to 39.9 in adult, unspecified obesity type Assessment & Plan: See obesity treatment plan.  She will look into future coverage before initiating Zepbound treatment.  She will also meet with her gynecologist to discuss other methods of birth control that do not involve oral contraception as GLP-1 may decrease effectiveness of oral contraceptives.   Prediabetes Assessment & Plan: Continue with medically supervised weight management plan.  She has reduced simple and added sugars in her diet.  She has not started Zepbound yet for weight management and pharmacoprophylaxis.  We will check hemoglobin A1c at the 44-month mark.   Family planning Assessment & Plan: We reviewed her concern regarding GLP-1 therapy and oral contraceptions.  GLP-1 treatment because of delayed gastric emptying may affect levels and effectiveness of OCP.  We talked about if she were to start medication she will need 2 forms of contraception.  She may also want to discuss other none oral means of contraception with her  gynecologist.     PHYSICAL EXAM:  Blood pressure 113/80, pulse 71, temperature 99 F (37.2 C), height 5\' 5"  (1.651 m), weight 232 lb (105.2 kg), last menstrual period 08/19/2023, SpO2 99%. Body mass index is 38.61 kg/m.  General: She is overweight, cooperative, alert, well developed, and in no acute distress. PSYCH: Has normal mood, affect and thought process.   HEENT: EOMI, sclerae are anicteric. Lungs: Normal breathing effort, no conversational dyspnea. Extremities: No edema.  Neurologic: No gross sensory or motor deficits. No tremors or fasciculations noted.    DIAGNOSTIC DATA REVIEWED:  BMET    Component Value Date/Time   NA 139 05/01/2023 0901   NA 141 06/30/2021 1036   K 4.1 05/01/2023 0901   CL 105 05/01/2023 0901   CO2 23 05/01/2023 0901   GLUCOSE 89 05/01/2023 0901   BUN 10 05/01/2023 0901   BUN 9 06/30/2021 1036   CREATININE 0.94 05/01/2023 0901   CALCIUM 9.1 05/01/2023 0901   GFRNONAA 76 10/20/2020 0937   GFRAA 87 10/20/2020 0937   Lab Results  Component Value Date   HGBA1C 5.9 (H) 06/12/2023   HGBA1C 5.1 05/16/2017   Lab Results  Component Value Date   INSULIN 14.8 06/12/2023   Lab Results  Component Value Date   TSH 1.460 06/12/2023   CBC    Component Value Date/Time   WBC 6.5 05/01/2023 0901   RBC 4.14 05/01/2023 0901   HGB 12.0  05/01/2023 0901   HGB 12.3 06/30/2021 1036   HCT 36.6 05/01/2023 0901   HCT 36.7 06/30/2021 1036   PLT 360.0 05/01/2023 0901   PLT 334 06/30/2021 1036   MCV 88.4 05/01/2023 0901   MCV 87 06/30/2021 1036   MCH 29.1 06/30/2021 1036   MCH 31.4 06/10/2016 0601   MCHC 32.8 05/01/2023 0901   RDW 14.8 05/01/2023 0901   RDW 13.2 06/30/2021 1036   Iron Studies    Component Value Date/Time   IRON 38 (L) 05/01/2023 0901   TIBC 550.2 (H) 05/01/2023 0901   FERRITIN 10.4 05/01/2023 0901   IRONPCTSAT 6.9 (L) 05/01/2023 0901   Lipid Panel     Component Value Date/Time   CHOL 192 05/01/2023 0901   CHOL 227 (H)  06/30/2021 1036   TRIG 91.0 05/01/2023 0901   HDL 68.10 05/01/2023 0901   HDL 69 06/30/2021 1036   CHOLHDL 3 05/01/2023 0901   VLDL 18.2 05/01/2023 0901   LDLCALC 106 (H) 05/01/2023 0901   LDLCALC 143 (H) 06/30/2021 1036   Hepatic Function Panel     Component Value Date/Time   PROT 6.7 04/21/2022 0815   PROT 6.9 06/30/2021 1036   ALBUMIN 3.9 04/21/2022 0815   ALBUMIN 4.3 06/30/2021 1036   AST 13 04/21/2022 0815   ALT 10 04/21/2022 0815   ALKPHOS 62 04/21/2022 0815   BILITOT 0.3 04/21/2022 0815   BILITOT 0.2 06/30/2021 1036      Component Value Date/Time   TSH 1.460 06/12/2023 0931   Nutritional Lab Results  Component Value Date   VD25OH 40.23 05/01/2023   VD25OH 45.21 10/23/2022   VD25OH 26.28 (L) 04/21/2022     Return in about 3 weeks (around 10/11/2023) for For Weight Mangement with Dr. Rikki Spearing.Marland Kitchen She was informed of the importance of frequent follow up visits to maximize her success with intensive lifestyle modifications for her multiple health conditions.   ATTESTASTION STATEMENTS:  Reviewed by clinician on day of visit: allergies, medications, problem list, medical history, surgical history, family history, social history, and previous encounter notes.     Worthy Rancher, MD

## 2023-09-20 NOTE — Assessment & Plan Note (Signed)
Stable.  She has increased orexigenic signaling, impaired satiety and inhibitory control. This is secondary to an abnormal energy regulation system and pathological neurohormonal pathways characteristic of excess adiposity.  In addition to nutritional and behavioral strategies she benefits from pharmacotherapy.

## 2023-09-20 NOTE — Assessment & Plan Note (Signed)
Continue with medically supervised weight management plan.  She has reduced simple and added sugars in her diet.  She has not started Zepbound yet for weight management and pharmacoprophylaxis.  We will check hemoglobin A1c at the 70-month mark.

## 2023-09-20 NOTE — Assessment & Plan Note (Signed)
We reviewed her concern regarding GLP-1 therapy and oral contraceptions.  GLP-1 treatment because of delayed gastric emptying may affect levels and effectiveness of OCP.  We talked about if she were to start medication she will need 2 forms of contraception.  She may also want to discuss other none oral means of contraception with her gynecologist.

## 2023-09-20 NOTE — Assessment & Plan Note (Signed)
See obesity treatment plan.  She will look into future coverage before initiating Zepbound treatment.  She will also meet with her gynecologist to discuss other methods of birth control that do not involve oral contraception as GLP-1 may decrease effectiveness of oral contraceptives.

## 2023-09-27 ENCOUNTER — Ambulatory Visit
Admission: RE | Admit: 2023-09-27 | Discharge: 2023-09-27 | Disposition: A | Discharge status: Home / Self Care | Payer: Managed Care, Other (non HMO) | Source: Ambulatory Visit | Attending: Family | Admitting: Family

## 2023-09-27 DIAGNOSIS — Z1231 Encounter for screening mammogram for malignant neoplasm of breast: Secondary | ICD-10-CM | POA: Diagnosis present

## 2023-10-11 ENCOUNTER — Encounter (INDEPENDENT_AMBULATORY_CARE_PROVIDER_SITE_OTHER): Payer: Self-pay | Admitting: Internal Medicine

## 2023-10-11 ENCOUNTER — Ambulatory Visit (INDEPENDENT_AMBULATORY_CARE_PROVIDER_SITE_OTHER): Payer: Managed Care, Other (non HMO) | Admitting: Internal Medicine

## 2023-10-11 DIAGNOSIS — Z6839 Body mass index (BMI) 39.0-39.9, adult: Secondary | ICD-10-CM

## 2023-10-11 DIAGNOSIS — E66812 Obesity, class 2: Secondary | ICD-10-CM

## 2023-10-11 DIAGNOSIS — R7303 Prediabetes: Secondary | ICD-10-CM | POA: Diagnosis not present

## 2023-10-11 MED ORDER — TIRZEPATIDE-WEIGHT MANAGEMENT 5 MG/0.5ML ~~LOC~~ SOAJ: 5.0000 mg | SUBCUTANEOUS | 0 refills | Status: DC

## 2023-10-11 NOTE — Assessment & Plan Note (Signed)
 See obesity treatment plan

## 2023-10-11 NOTE — Assessment & Plan Note (Signed)
Asymptomatic.  Currently on tirzepatide 2.5 mg once a week for pharmacoprophylaxis.  Medication will be increased to 5 mg once a week for weight management.

## 2023-10-11 NOTE — Progress Notes (Signed)
Office: [phone]  /  Fax: [phone]  WEIGHT SUMMARY AND BIOMETRICS  Vitals Temp: 98.4 F (36.9 C) BP: 113/78 Pulse Rate: 72 SpO2: 100 %   Anthropometric Measurements Height: 5\' 5"  (1.651 m) Weight: 233 lb (105.7 kg) BMI (Calculated): 38.77 Weight at Last Visit: 232 lb Weight Lost Since Last Visit: 0 lb Weight Gained Since Last Visit: 1 lb Starting Weight: 239 lb Total Weight Loss (lbs): 6 lb (2.722 kg) Peak Weight: 242 lb   Body Composition  Body Fat %: 44.5 % Fat Mass (lbs): 103.6 lbs Muscle Mass (lbs): 122.8 lbs Total Body Water (lbs): 88 lbs Visceral Fat Rating : 12    No data recorded Today's Visit #: 6  Starting Date: 06/12/23   HPI  Chief Complaint: OBESITY  Stacey Santiago is here to discuss her progress with her obesity treatment plan. She is on the the Category 2 Plan and states she is following her eating plan approximately 75 % of the time. She states she is exercising 30 minutes 3 times per week.  Interval History:  Since last office visit she has gained 1 pounds. She reports good adherence to reduced calorie nutritional plan. She has been working on reading food labels, not skipping meals, increasing protein intake at every meal, drinking more water, making healthier choices, reducing portion sizes, and incorporating more whole foods.  She is currently on Zepbound 2.5 mg once a week and has done 1 out of 4 shots.  She reports some mild nausea but no issues with diarrhea or constipation.  Has noticed a slight reduction in hunger    Barriers identified: strong hunger signals and impaired satiety / inhibitory control.   Pharmacotherapy for weight loss: She is currently taking Zepbound with adequate clinical response  and experiencing the following side effects: Mild self-limiting nausea..    ASSESSMENT AND PLAN  TREATMENT PLAN FOR OBESITY:  Recommended Dietary Goals  Stacey Santiago is currently in the action stage of change. As such, her goal is to  continue weight management plan. She has agreed to: continue current plan  Behavioral Intervention  We discussed the following Behavioral Modification Strategies today: continue to work on maintaining a reduced calorie state, getting the recommended amount of protein, incorporating whole foods, making healthy choices, staying well hydrated and practicing mindfulness when eating..  Additional resources provided today: None  Recommended Physical Activity Goals  Stacey Santiago has been advised to work up to 150 minutes of moderate intensity aerobic activity a week and strengthening exercises 2-3 times per week for cardiovascular health, weight loss maintenance and preservation of muscle mass.   She has agreed to :  Think about enjoyable ways to increase daily physical activity and overcoming barriers to exercise and Increase physical activity in their day and reduce sedentary time (increase NEAT).  Pharmacotherapy We discussed various medication options to help Stacey Santiago with her weight loss efforts and we both agreed to : increase Zepbound to 5 mg once a week  ASSOCIATED CONDITIONS ADDRESSED TODAY  Class 2 obesity without serious comorbidity with body mass index (BMI) of 39.0 to 39.9 in adult, unspecified obesity type Assessment & Plan: See obesity treatment plan  Orders: -     Tirzepatide-Weight Management; Inject 5 mg into the skin once a week.  Dispense: 2 mL; Refill: 0  Prediabetes Assessment & Plan: Asymptomatic.  Currently on tirzepatide 2.5 mg once a week for pharmacoprophylaxis.  Medication will be increased to 5 mg once a week for weight management.  Orders: -  Tirzepatide-Weight Management; Inject 5 mg into the skin once a week.  Dispense: 2 mL; Refill: 0    PHYSICAL EXAM:  Blood pressure 113/78, pulse 72, temperature 98.4 F (36.9 C), height 5\' 5"  (1.651 m), weight 233 lb (105.7 kg), last menstrual period 09/21/2023, SpO2 100%. Body mass index is 38.77 kg/m.  General: She  is overweight, cooperative, alert, well developed, and in no acute distress. PSYCH: Has normal mood, affect and thought process.   HEENT: EOMI, sclerae are anicteric. Lungs: Normal breathing effort, no conversational dyspnea. Extremities: No edema.  Neurologic: No gross sensory or motor deficits. No tremors or fasciculations noted.    DIAGNOSTIC DATA REVIEWED:  BMET    Component Value Date/Time   NA 139 05/01/2023 0901   NA 141 06/30/2021 1036   K 4.1 05/01/2023 0901   CL 105 05/01/2023 0901   CO2 23 05/01/2023 0901   GLUCOSE 89 05/01/2023 0901   BUN 10 05/01/2023 0901   BUN 9 06/30/2021 1036   CREATININE 0.94 05/01/2023 0901   CALCIUM 9.1 05/01/2023 0901   GFRNONAA 76 10/20/2020 0937   GFRAA 87 10/20/2020 0937   Lab Results  Component Value Date   HGBA1C 5.9 (H) 06/12/2023   HGBA1C 5.1 05/16/2017   Lab Results  Component Value Date   INSULIN 14.8 06/12/2023   Lab Results  Component Value Date   TSH 1.460 06/12/2023   CBC    Component Value Date/Time   WBC 6.5 05/01/2023 0901   RBC 4.14 05/01/2023 0901   HGB 12.0 05/01/2023 0901   HGB 12.3 06/30/2021 1036   HCT 36.6 05/01/2023 0901   HCT 36.7 06/30/2021 1036   PLT 360.0 05/01/2023 0901   PLT 334 06/30/2021 1036   MCV 88.4 05/01/2023 0901   MCV 87 06/30/2021 1036   MCH 29.1 06/30/2021 1036   MCH 31.4 06/10/2016 0601   MCHC 32.8 05/01/2023 0901   RDW 14.8 05/01/2023 0901   RDW 13.2 06/30/2021 1036   Iron Studies    Component Value Date/Time   IRON 38 (L) 05/01/2023 0901   TIBC 550.2 (H) 05/01/2023 0901   FERRITIN 10.4 05/01/2023 0901   IRONPCTSAT 6.9 (L) 05/01/2023 0901   Lipid Panel     Component Value Date/Time   CHOL 192 05/01/2023 0901   CHOL 227 (H) 06/30/2021 1036   TRIG 91.0 05/01/2023 0901   HDL 68.10 05/01/2023 0901   HDL 69 06/30/2021 1036   CHOLHDL 3 05/01/2023 0901   VLDL 18.2 05/01/2023 0901   LDLCALC 106 (H) 05/01/2023 0901   LDLCALC 143 (H) 06/30/2021 1036   Hepatic Function  Panel     Component Value Date/Time   PROT 6.7 04/21/2022 0815   PROT 6.9 06/30/2021 1036   ALBUMIN 3.9 04/21/2022 0815   ALBUMIN 4.3 06/30/2021 1036   AST 13 04/21/2022 0815   ALT 10 04/21/2022 0815   ALKPHOS 62 04/21/2022 0815   BILITOT 0.3 04/21/2022 0815   BILITOT 0.2 06/30/2021 1036      Component Value Date/Time   TSH 1.460 06/12/2023 0931   Nutritional Lab Results  Component Value Date   VD25OH 40.23 05/01/2023   VD25OH 45.21 10/23/2022   VD25OH 26.28 (L) 04/21/2022     Return in about 3 weeks (around 11/01/2023) for For Weight Mangement with Dr. Rikki Spearing.Marland Kitchen She was informed of the importance of frequent follow up visits to maximize her success with intensive lifestyle modifications for her multiple health conditions.   ATTESTASTION STATEMENTS:  Reviewed by clinician on  day of visit: allergies, medications, problem list, medical history, surgical history, family history, social history, and previous encounter notes.     Worthy Rancher, MD

## 2023-11-05 ENCOUNTER — Ambulatory Visit (INDEPENDENT_AMBULATORY_CARE_PROVIDER_SITE_OTHER): Payer: Managed Care, Other (non HMO) | Admitting: Internal Medicine

## 2023-11-05 ENCOUNTER — Encounter (INDEPENDENT_AMBULATORY_CARE_PROVIDER_SITE_OTHER): Payer: Self-pay | Admitting: Internal Medicine

## 2023-11-05 VITALS — BP 120/80 | HR 72 | Temp 98.0°F | Ht 65.0 in | Wt 230.0 lb

## 2023-11-05 DIAGNOSIS — R7303 Prediabetes: Secondary | ICD-10-CM | POA: Diagnosis not present

## 2023-11-05 DIAGNOSIS — E66812 Obesity, class 2: Secondary | ICD-10-CM

## 2023-11-05 DIAGNOSIS — R638 Other symptoms and signs concerning food and fluid intake: Secondary | ICD-10-CM | POA: Diagnosis not present

## 2023-11-05 DIAGNOSIS — Z6839 Body mass index (BMI) 39.0-39.9, adult: Secondary | ICD-10-CM

## 2023-11-05 MED ORDER — TIRZEPATIDE-WEIGHT MANAGEMENT 5 MG/0.5ML ~~LOC~~ SOAJ: 5.0000 mg | SUBCUTANEOUS | 0 refills | Status: DC

## 2023-11-05 NOTE — Progress Notes (Unsigned)
Office: [phone]  /  Fax: [phone]  Weight Summary And Biometrics  Vitals Temp: 98 F (36.7 C) BP: 120/80 Pulse Rate: 72 SpO2: 100 %   Anthropometric Measurements Height: 5\' 5"  (1.651 m) Weight: 230 lb (104.3 kg) BMI (Calculated): 38.27 Weight at Last Visit: 233 lb Weight Lost Since Last Visit: 3 lb Weight Gained Since Last Visit: 0 lb Starting Weight: 239 lb Total Weight Loss (lbs): 9 lb (4.082 kg) Peak Weight: 242 lb   Body Composition  Body Fat %: 44.5 % Fat Mass (lbs): 102.8 lbs Muscle Mass (lbs): 121.6 lbs Total Body Water (lbs): 86.8 lbs Visceral Fat Rating : 12    No data recorded Today's Visit #: 7  Starting Date: 06/12/23   Subjective   Chief Complaint: Obesity  Stacey Santiago is here to discuss her progress with her obesity treatment plan. She is on the the Category 2 Plan and states she is following her eating plan approximately 85 % of the time. She states she is exercising 30 minutes 3-4 times per week.  Interval History:   Discussed the use of AI scribe software for clinical note transcription with the patient, who gave verbal consent to proceed.  History of Present Illness    Patient presents for a follow-up on weight management. Since the last visit, she has lost three pounds and has been taking tirzepatide 5mg . She initially experienced lightheadedness and nausea during the first week of medication, but these symptoms have since subsided. She also reported mild constipation but continues to have regular bowel movements.  The patient has made significant dietary changes, including increased water intake and a conscious effort to eat healthier. Breakfast typically consists of eggs or a protein shake, and lunch often includes cottage cheese, hummus, and vegetables. Dinner is usually low in carbohydrates. The patient has also significantly reduced soda intake, which has positively impacted her weight loss journey.  Despite these improvements, the  patient still faces challenges, particularly during travel for her child's sports activities. She has started packing protein bars and other healthy snacks to avoid resorting to concession stand food during these trips.  The patient works from home and also cares for her spouse, who had a stroke and is unable to return to work due to physical limitations. Despite these responsibilities, the patient manages to engage in physical activity three to four times per week for about 30 minutes each session.  The patient's weight has steadily decreased from 240 pounds in June to 230 pounds at the time of the consultation. She expressed a desire to remain on the current dose of tirzepatide for another month before considering an increase. She is also preparing for a change in insurance from [place] to [place] but has confirmed that the cost of her medication will remain the same.     Orexigenic Control:  Denies problems with appetite and hunger signals.  Denies problems with satiety and satiation.  Denies problems with eating patterns and portion control.  Denies abnormal cravings. Denies feeling deprived or restricted.   Barriers identified: none.   Pharmacotherapy for weight loss: She is currently taking Zepbound with adequate clinical response  and without side effects.   Assessment and Plan   Treatment Plan For Obesity:  Recommended Dietary Goals  Stacey Santiago is currently in the action stage of change. As such, her goal is to continue weight management plan. She has agreed to: continue current plan  Behavioral Intervention  We discussed the following Behavioral Modification Strategies today: continue to work  on maintaining a reduced calorie state, getting the recommended amount of protein, incorporating whole foods, making healthy choices, staying well hydrated and practicing mindfulness when eating..  Additional resources provided today: Handout on traveling and holiday eating  strategies  Recommended Physical Activity Goals  Stacey Santiago has been advised to work up to 150 minutes of moderate intensity aerobic activity a week and strengthening exercises 2-3 times per week for cardiovascular health, weight loss maintenance and preservation of muscle mass.   She has agreed to :  continue to gradually increase the amount and intensity of exercise   Pharmacotherapy  We discussed various medication options to help Stacey Santiago with her weight loss efforts and we both agreed to : continue current anti-obesity medication regimen.  Patient is having adequate clinical response and is not experiencing any side effects she will stay at this dose for another month.  Associated Conditions Addressed Today  Abnormal food appetite Assessment & Plan: Improved on Zepbound.  She had increased orexigenic signaling, impaired satiety and inhibitory control. This is secondary to an abnormal energy regulation system and pathological neurohormonal pathways characteristic of excess adiposity.  In addition to nutritional and behavioral strategies she benefits from ongoing pharmacotherapy.  She will continue Zepbound at current dose    Class 2 obesity without serious comorbidity with body mass index (BMI) of 39.0 to 39.9 in adult, unspecified obesity type Assessment & Plan: See obesity treatment plan  Orders: -     Tirzepatide-Weight Management; Inject 5 mg into the skin once a week.  Dispense: 2 mL; Refill: 0  Prediabetes Assessment & Plan: Asymptomatic.  Currently on tirzepatide 5 mg once a week for pharmacoprophylaxis.  She will continue current dose.  She will continue with nutritional and behavioral strategies and maintaining a diet low in simple and added sugars.  Orders: -     Tirzepatide-Weight Management; Inject 5 mg into the skin once a week.  Dispense: 2 mL; Refill: 0     Objective   Physical Exam:  Blood pressure 120/80, pulse 72, temperature 98 F (36.7 C), height 5\' 5"  (1.651  m), weight 230 lb (104.3 kg), last menstrual period 10/12/2023, SpO2 100%. Body mass index is 38.27 kg/m.  General: She is overweight, cooperative, alert, well developed, and in no acute distress. PSYCH: Has normal mood, affect and thought process.   HEENT: EOMI, sclerae are anicteric. Lungs: Normal breathing effort, no conversational dyspnea. Extremities: No edema.  Neurologic: No gross sensory or motor deficits. No tremors or fasciculations noted.    Diagnostic Data Reviewed:  BMET    Component Value Date/Time   NA 139 05/01/2023 0901   NA 141 06/30/2021 1036   K 4.1 05/01/2023 0901   CL 105 05/01/2023 0901   CO2 23 05/01/2023 0901   GLUCOSE 89 05/01/2023 0901   BUN 10 05/01/2023 0901   BUN 9 06/30/2021 1036   CREATININE 0.94 05/01/2023 0901   CALCIUM 9.1 05/01/2023 0901   GFRNONAA 76 10/20/2020 0937   GFRAA 87 10/20/2020 0937   Lab Results  Component Value Date   HGBA1C 5.9 (H) 06/12/2023   HGBA1C 5.1 05/16/2017   Lab Results  Component Value Date   INSULIN 14.8 06/12/2023   Lab Results  Component Value Date   TSH 1.460 06/12/2023   CBC    Component Value Date/Time   WBC 6.5 05/01/2023 0901   RBC 4.14 05/01/2023 0901   HGB 12.0 05/01/2023 0901   HGB 12.3 06/30/2021 1036   HCT 36.6 05/01/2023 0901  HCT 36.7 06/30/2021 1036   PLT 360.0 05/01/2023 0901   PLT 334 06/30/2021 1036   MCV 88.4 05/01/2023 0901   MCV 87 06/30/2021 1036   MCH 29.1 06/30/2021 1036   MCH 31.4 06/10/2016 0601   MCHC 32.8 05/01/2023 0901   RDW 14.8 05/01/2023 0901   RDW 13.2 06/30/2021 1036   Iron Studies    Component Value Date/Time   IRON 38 (L) 05/01/2023 0901   TIBC 550.2 (H) 05/01/2023 0901   FERRITIN 10.4 05/01/2023 0901   IRONPCTSAT 6.9 (L) 05/01/2023 0901   Lipid Panel     Component Value Date/Time   CHOL 192 05/01/2023 0901   CHOL 227 (H) 06/30/2021 1036   TRIG 91.0 05/01/2023 0901   HDL 68.10 05/01/2023 0901   HDL 69 06/30/2021 1036   CHOLHDL 3 05/01/2023  0901   VLDL 18.2 05/01/2023 0901   LDLCALC 106 (H) 05/01/2023 0901   LDLCALC 143 (H) 06/30/2021 1036   Hepatic Function Panel     Component Value Date/Time   PROT 6.7 04/21/2022 0815   PROT 6.9 06/30/2021 1036   ALBUMIN 3.9 04/21/2022 0815   ALBUMIN 4.3 06/30/2021 1036   AST 13 04/21/2022 0815   ALT 10 04/21/2022 0815   ALKPHOS 62 04/21/2022 0815   BILITOT 0.3 04/21/2022 0815   BILITOT 0.2 06/30/2021 1036      Component Value Date/Time   TSH 1.460 06/12/2023 0931   Nutritional Lab Results  Component Value Date   VD25OH 40.23 05/01/2023   VD25OH 45.21 10/23/2022   VD25OH 26.28 (L) 04/21/2022    Follow-Up   Return in about 3 weeks (around 11/26/2023) for For Weight Mangement with Dr. Rikki Spearing.Marland Kitchen She was informed of the importance of frequent follow up visits to maximize her success with intensive lifestyle modifications for her multiple health conditions.  Attestation Statement   Reviewed by clinician on day of visit: allergies, medications, problem list, medical history, surgical history, family history, social history, and previous encounter notes.     Worthy Rancher, MD

## 2023-11-06 NOTE — Assessment & Plan Note (Signed)
Asymptomatic.  Currently on tirzepatide 5 mg once a week for pharmacoprophylaxis.  She will continue current dose.  She will continue with nutritional and behavioral strategies and maintaining a diet low in simple and added sugars.

## 2023-11-06 NOTE — Assessment & Plan Note (Signed)
 See obesity treatment plan

## 2023-11-06 NOTE — Assessment & Plan Note (Signed)
Improved on Zepbound.  She had increased orexigenic signaling, impaired satiety and inhibitory control. This is secondary to an abnormal energy regulation system and pathological neurohormonal pathways characteristic of excess adiposity.  In addition to nutritional and behavioral strategies she benefits from ongoing pharmacotherapy.  She will continue Zepbound at current dose

## 2023-12-18 ENCOUNTER — Encounter (INDEPENDENT_AMBULATORY_CARE_PROVIDER_SITE_OTHER): Payer: Self-pay | Admitting: Internal Medicine

## 2023-12-18 ENCOUNTER — Ambulatory Visit (INDEPENDENT_AMBULATORY_CARE_PROVIDER_SITE_OTHER): Payer: 59 | Admitting: Internal Medicine

## 2023-12-18 VITALS — BP 111/77 | HR 88 | Temp 98.2°F | Ht 65.0 in | Wt 217.0 lb

## 2023-12-18 DIAGNOSIS — R638 Other symptoms and signs concerning food and fluid intake: Secondary | ICD-10-CM | POA: Diagnosis not present

## 2023-12-18 DIAGNOSIS — Z6839 Body mass index (BMI) 39.0-39.9, adult: Secondary | ICD-10-CM | POA: Diagnosis not present

## 2023-12-18 DIAGNOSIS — E66812 Obesity, class 2: Secondary | ICD-10-CM

## 2023-12-18 DIAGNOSIS — R7303 Prediabetes: Secondary | ICD-10-CM | POA: Diagnosis not present

## 2023-12-18 MED ORDER — TIRZEPATIDE-WEIGHT MANAGEMENT 5 MG/0.5ML ~~LOC~~ SOAJ: 5.0000 mg | SUBCUTANEOUS | 0 refills | Status: DC

## 2023-12-18 NOTE — Assessment & Plan Note (Signed)
 Improved on Zepbound.  She had increased orexigenic signaling, impaired satiety and inhibitory control. This is secondary to an abnormal energy regulation system and pathological neurohormonal pathways characteristic of excess adiposity.  In addition to nutritional and behavioral strategies she benefits from ongoing pharmacotherapy.  She will continue Zepbound at current dose

## 2023-12-18 NOTE — Progress Notes (Signed)
 Office: [phone]  /  Fax: [phone]  Weight Summary And Biometrics  Vitals Temp: 98.2 F (36.8 C) BP: 111/77 Pulse Rate: 88 SpO2: 98 %   Anthropometric Measurements Height: 5' 5 (1.651 m) Weight: 217 lb (98.4 kg) BMI (Calculated): 36.11 Weight at Last Visit: 230 lb Weight Lost Since Last Visit: 13 lb Weight Gained Since Last Visit: 0 lb Starting Weight: 239 lb Total Weight Loss (lbs): 22 lb (9.979 kg) Peak Weight: 242 lb   Body Composition  Body Fat %: 42 % Fat Mass (lbs): 91.2 lbs Muscle Mass (lbs): 119.4 lbs Total Body Water (lbs): 80.6 lbs Visceral Fat Rating : 10    No data recorded Today's Visit #: 8  Starting Date: 06/12/23   Subjective   Chief Complaint: Obesity  Stacey Santiago is here to discuss her progress with her obesity treatment plan. She is on the the Category 2 Plan and states she is following her eating plan approximately 85 % of the time. She states she is exercising 30 minutes 3 times per week.  Interval History:   Discussed the use of AI scribe software for clinical note transcription with the patient, who gave verbal consent to proceed.  History of Present Illness   The patient, who is currently on a medical weight management program, has impressively lost thirteen pounds over the holiday period. She attributes this weight loss to a conscious effort to control portion sizes and limit indulgence in desserts during family gatherings. She reports not feeling deprived during this period and has been eating smaller meals throughout the day to manage hunger, which she rates as a 4 on a scale of 1 to 10, with 10 being constantly hungry.  The patient has been adhering to a reduced calorie, high protein diet about 85% of the time and exercises for thirty minutes three times per week. However, she acknowledges that she may not be consistently meeting her protein intake goals daily.  Despite the successful weight loss, the patient's body composition  analysis shows a concerning loss of muscle mass. She started with an average muscle mass of around 129 pounds, which has now decreased to 119 pounds.   The patient has been taking a multivitamin with minerals and is on a 5 milligram dose of Setmelanotide (Setbound). She reports adequate appetite control on this medication and sometimes has to remind herself to eat.  The patient has free weights at home and expresses interest in incorporating more strength training into her exercise routine. She also mentions the possibility of engaging in a plank competition with her daughter, who is currently doing weight training for track.       Orexigenic Control:  Reports improved problems with appetite and hunger signals.  Reports improved problems with satiety and satiation.  Reports improved problems with eating patterns and portion control.  Denies abnormal cravings. Denies feeling deprived or restricted.   Barriers identified:  losing muscle weight too quickly .   Pharmacotherapy for weight loss: She is currently taking Zepbound  with adequate clinical response  and without side effects..   Assessment and Plan   Treatment Plan For Obesity:  Recommended Dietary Goals  Arriel is currently in the action stage of change. As such, her goal is to continue weight management plan. She has agreed to: continue current plan and patient instructed to begin tracking and journaling I will like her to not go under 1200 cal  Behavioral Intervention  We discussed the following Behavioral Modification Strategies today: increasing lean protein intake  to established goals and increase calories to slow down weight loss rate .  Additional resources provided today: Handout and personalized instruction on tracking and journaling using Apps  Recommended Physical Activity Goals  Kaityln has been advised to work up to 150 minutes of moderate intensity aerobic activity a week and strengthening exercises 2-3 times per  week for cardiovascular health, weight loss maintenance and preservation of muscle mass.   She has agreed to :  Start strengthening exercises with a goal of 2-3 sessions a week   Pharmacotherapy  We discussed various medication options to help Fidelia with her weight loss efforts and we both agreed to : continue current anti-obesity medication regimen  Associated Conditions Addressed and Impacted by Obesity Treatment  Abnormal food appetite Assessment & Plan: Improved on Zepbound .  She had increased orexigenic signaling, impaired satiety and inhibitory control. This is secondary to an abnormal energy regulation system and pathological neurohormonal pathways characteristic of excess adiposity.  In addition to nutritional and behavioral strategies she benefits from ongoing pharmacotherapy.  She will continue Zepbound  at current dose    Class 2 obesity without serious comorbidity with body mass index (BMI) of 39.0 to 39.9 in adult, unspecified obesity type Assessment & Plan: Actively managing weight with a loss of 26 pounds since June 2024, currently weighing 217 pounds. On semaglutide  (5 mg) and following a reduced-calorie, high-protein diet. Rapid weight loss has resulted in significant muscle mass loss (approximately 12 pounds). Goal is to slow weight loss to about 2 pounds per week to prevent further muscle loss and potential adverse effects such as malnutrition and hair loss. Discussed the importance of maintaining muscle mass through adequate protein intake and strength training. Explained risks of rapid weight loss including muscle loss, malnutrition, and hair loss. Emphasized the need for a balanced approach to weight loss.  - Maintain Zepbound  at 5 mg   - Ensure intake of at least 1200 calories per day   - Increase protein intake to 90-120 grams per day   - Incorporate strength training exercises 2-3 times per week   - Download and use a tracking app like [employer] to monitor protein and  calorie intake   - Add healthy fats to meals (e.g., olive oil, nuts, avocado)   - Take a multivitamin with minerals daily   - Follow up in 4 weeks    Orders: -     Tirzepatide -Weight Management; Inject 5 mg into the skin once a week.  Dispense: 2 mL; Refill: 0  Prediabetes Assessment & Plan: Asymptomatic.  Currently on tirzepatide  5 mg once a week for pharmacoprophylaxis.  She will continue current dose.  She will continue with nutritional and behavioral strategies and maintaining a diet low in simple and added sugars.  Orders: -     Tirzepatide -Weight Management; Inject 5 mg into the skin once a week.  Dispense: 2 mL; Refill: 0         Objective   Physical Exam:  Blood pressure 111/77, pulse 88, temperature 98.2 F (36.8 C), height 5' 5 (1.651 m), weight 217 lb (98.4 kg), SpO2 98%. Body mass index is 36.11 kg/m.  General: She is overweight, cooperative, alert, well developed, and in no acute distress. PSYCH: Has normal mood, affect and thought process.   HEENT: EOMI, sclerae are anicteric. Lungs: Normal breathing effort, no conversational dyspnea. Extremities: No edema.  Neurologic: No gross sensory or motor deficits. No tremors or fasciculations noted.    Diagnostic Data Reviewed:  BMET  Component Value Date/Time   NA 139 05/01/2023 0901   NA 141 06/30/2021 1036   K 4.1 05/01/2023 0901   CL 105 05/01/2023 0901   CO2 23 05/01/2023 0901   GLUCOSE 89 05/01/2023 0901   BUN 10 05/01/2023 0901   BUN 9 06/30/2021 1036   CREATININE 0.94 05/01/2023 0901   CALCIUM 9.1 05/01/2023 0901   GFRNONAA 76 10/20/2020 0937   GFRAA 87 10/20/2020 0937   Lab Results  Component Value Date   HGBA1C 5.9 (H) 06/12/2023   HGBA1C 5.1 05/16/2017   Lab Results  Component Value Date   INSULIN  14.8 06/12/2023   Lab Results  Component Value Date   TSH 1.460 06/12/2023   CBC    Component Value Date/Time   WBC 6.5 05/01/2023 0901   RBC 4.14 05/01/2023 0901   HGB 12.0  05/01/2023 0901   HGB 12.3 06/30/2021 1036   HCT 36.6 05/01/2023 0901   HCT 36.7 06/30/2021 1036   PLT 360.0 05/01/2023 0901   PLT 334 06/30/2021 1036   MCV 88.4 05/01/2023 0901   MCV 87 06/30/2021 1036   MCH 29.1 06/30/2021 1036   MCH 31.4 06/10/2016 0601   MCHC 32.8 05/01/2023 0901   RDW 14.8 05/01/2023 0901   RDW 13.2 06/30/2021 1036   Iron Studies    Component Value Date/Time   IRON 38 (L) 05/01/2023 0901   TIBC 550.2 (H) 05/01/2023 0901   FERRITIN 10.4 05/01/2023 0901   IRONPCTSAT 6.9 (L) 05/01/2023 0901   Lipid Panel     Component Value Date/Time   CHOL 192 05/01/2023 0901   CHOL 227 (H) 06/30/2021 1036   TRIG 91.0 05/01/2023 0901   HDL 68.10 05/01/2023 0901   HDL 69 06/30/2021 1036   CHOLHDL 3 05/01/2023 0901   VLDL 18.2 05/01/2023 0901   LDLCALC 106 (H) 05/01/2023 0901   LDLCALC 143 (H) 06/30/2021 1036   Hepatic Function Panel     Component Value Date/Time   PROT 6.7 04/21/2022 0815   PROT 6.9 06/30/2021 1036   ALBUMIN 3.9 04/21/2022 0815   ALBUMIN 4.3 06/30/2021 1036   AST 13 04/21/2022 0815   ALT 10 04/21/2022 0815   ALKPHOS 62 04/21/2022 0815   BILITOT 0.3 04/21/2022 0815   BILITOT 0.2 06/30/2021 1036      Component Value Date/Time   TSH 1.460 06/12/2023 0931   Nutritional Lab Results  Component Value Date   VD25OH 40.23 05/01/2023   VD25OH 45.21 10/23/2022   VD25OH 26.28 (L) 04/21/2022    Follow-Up   Return in about 4 weeks (around 01/15/2024) for For Weight Mangement with Dr. Francyne.SABRA She was informed of the importance of frequent follow up visits to maximize her success with intensive lifestyle modifications for her multiple health conditions.  Attestation Statement   Reviewed by clinician on day of visit: allergies, medications, problem list, medical history, surgical history, family history, social history, and previous encounter notes.     Lucas Francyne, MD

## 2023-12-18 NOTE — Assessment & Plan Note (Signed)
 Actively managing weight with a loss of 26 pounds since June 2024, currently weighing 217 pounds. On semaglutide  (5 mg) and following a reduced-calorie, high-protein diet. Rapid weight loss has resulted in significant muscle mass loss (approximately 12 pounds). Goal is to slow weight loss to about 2 pounds per week to prevent further muscle loss and potential adverse effects such as malnutrition and hair loss. Discussed the importance of maintaining muscle mass through adequate protein intake and strength training. Explained risks of rapid weight loss including muscle loss, malnutrition, and hair loss. Emphasized the need for a balanced approach to weight loss.  - Maintain Zepbound  at 5 mg   - Ensure intake of at least 1200 calories per day   - Increase protein intake to 90-120 grams per day   - Incorporate strength training exercises 2-3 times per week   - Download and use a tracking app like [employer] to monitor protein and calorie intake   - Add healthy fats to meals (e.g., olive oil, nuts, avocado)   - Take a multivitamin with minerals daily   - Follow up in 4 weeks

## 2023-12-18 NOTE — Assessment & Plan Note (Signed)
 Asymptomatic.  Currently on tirzepatide 5 mg once a week for pharmacoprophylaxis.  She will continue current dose.  She will continue with nutritional and behavioral strategies and maintaining a diet low in simple and added sugars.

## 2024-01-15 ENCOUNTER — Encounter (INDEPENDENT_AMBULATORY_CARE_PROVIDER_SITE_OTHER): Payer: Self-pay | Admitting: Internal Medicine

## 2024-01-15 ENCOUNTER — Ambulatory Visit (INDEPENDENT_AMBULATORY_CARE_PROVIDER_SITE_OTHER): Payer: 59 | Admitting: Internal Medicine

## 2024-01-15 VITALS — BP 104/73 | HR 76 | Temp 98.1°F | Ht 65.0 in | Wt 213.0 lb

## 2024-01-15 DIAGNOSIS — E538 Deficiency of other specified B group vitamins: Secondary | ICD-10-CM

## 2024-01-15 DIAGNOSIS — R638 Other symptoms and signs concerning food and fluid intake: Secondary | ICD-10-CM

## 2024-01-15 DIAGNOSIS — E66812 Obesity, class 2: Secondary | ICD-10-CM | POA: Diagnosis not present

## 2024-01-15 DIAGNOSIS — Z6839 Body mass index (BMI) 39.0-39.9, adult: Secondary | ICD-10-CM

## 2024-01-15 DIAGNOSIS — R7303 Prediabetes: Secondary | ICD-10-CM | POA: Diagnosis not present

## 2024-01-15 MED ORDER — TIRZEPATIDE-WEIGHT MANAGEMENT 5 MG/0.5ML ~~LOC~~ SOAJ: 5.0000 mg | SUBCUTANEOUS | 0 refills | Status: DC

## 2024-01-15 NOTE — Assessment & Plan Note (Signed)
Patient advised to hold B12 supplementation 2 days before testing we will also check methylmalonic acid at the next office visit.

## 2024-01-15 NOTE — Assessment & Plan Note (Signed)
 Improved on Zepbound.  She had increased orexigenic signaling, impaired satiety and inhibitory control. This is secondary to an abnormal energy regulation system and pathological neurohormonal pathways characteristic of excess adiposity.  In addition to nutritional and behavioral strategies she benefits from ongoing pharmacotherapy.  She will continue Zepbound at current dose

## 2024-01-15 NOTE — Assessment & Plan Note (Signed)
 She has lost 29 pounds or 12% of total body weight on medically supervised weight management plan inclusive of GLP-1 therapy.  Because of loss of muscle mass at a rate of 30% we have not increase her GLP-1.  She continues to increase her protein intake and has started to do strengthening exercises.  She will aim to getting 30 to 40 g per meal and doing 2-3 sessions of strengthening per week.  Also advised to increase calories a day that she is working out particularly if she is feeling hungry.  Zepbound  will stay at 5 mg once a week.

## 2024-01-15 NOTE — Progress Notes (Signed)
 Office: [phone]  /  Fax: [phone]  Weight Summary And Biometrics  Vitals Temp: 98.1 F (36.7 C) BP: 104/73 Pulse Rate: 76 SpO2: 100 %   Anthropometric Measurements Height: 5' 5 (1.651 m) Weight: 213 lb (96.6 kg) BMI (Calculated): 35.44 Weight at Last Visit: 217 lb Weight Lost Since Last Visit: 4 lb Weight Gained Since Last Visit: 0 lb Starting Weight: 239 lb Total Weight Loss (lbs): 26 lb (11.8 kg) Peak Weight: 242 lb   Body Composition  Body Fat %: 41.7 % Fat Mass (lbs): 89 lbs Muscle Mass (lbs): 118.4 lbs Total Body Water (lbs): 82.6 lbs Visceral Fat Rating : 10    No data recorded Today's Visit #: 9  Starting Date: 06/12/23   Subjective   Chief Complaint: Obesity  Stacey Santiago is here to discuss her progress with her obesity treatment plan. She is on the the Category 2 Plan and states she is following her eating plan approximately 90 % of the time. She states she is exercising 30 minutes 5 times per week.  Weight Progress Since Last Visit:  Since last office visit she has lost 4 pounds. She reports good adherence to reduced calorie nutritional plan. She has been working on reading food labels, not skipping meals, increasing protein intake at every meal, drinking more water, making healthier choices, reducing portion sizes, and incorporating more whole foods   Challenges affecting patient progress: none.   Stacey Santiago: Denies problems with appetite and hunger signals.  Denies problems with satiety and satiation.  Denies problems with eating patterns and portion Santiago.  Denies abnormal cravings. Denies feeling deprived or restricted.   Pharmacotherapy for weight management: She is currently taking Stacey Santiago  with adequate clinical response  and without side effects..   Assessment and Plan   Treatment Plan For Obesity:  Recommended Dietary Goals  Stacey Santiago is currently in the action stage of change. As such, her goal is to continue weight  management plan. She has agreed to: continue current plan  Behavioral Health and Counseling  We discussed the following behavioral modification strategies today: continue to work on maintaining a reduced calorie state, getting the recommended amount of protein, incorporating whole foods, making healthy choices, staying well hydrated and practicing mindfulness when eating..  Additional education and resources provided today: None  Recommended Physical Activity Goals  Stacey Santiago has been advised to work up to 150 minutes of moderate intensity aerobic activity a week and strengthening exercises 2-3 times per week for cardiovascular health, weight loss maintenance and preservation of muscle mass.   She has agreed to :  Increase the intensity, frequency or duration of strengthening exercises   Pharmacotherapy  We discussed various medication options to help Stacey Santiago with her weight loss efforts and we both agreed to : adequate clinical response to current dose, continue current regimen and do not recommend further increases in GLP-1 due to loss of muscle  Associated Conditions Impacted by Obesity Treatment  B12 deficiency Assessment & Plan: Patient advised to hold B12 supplementation 2 days before testing we will also check methylmalonic acid at the next office visit.   Class 2 obesity without serious comorbidity with body mass index (BMI) of 39.0 to 39.9 in adult, unspecified obesity type Assessment & Plan: She has lost 29 pounds or 12% of total body weight on medically supervised weight management plan inclusive of GLP-1 therapy.  Because of loss of muscle mass at a rate of 30% we have not increase her GLP-1.  She continues to increase her  protein intake and has started to do strengthening exercises.  She will aim to getting 30 to 40 g per meal and doing 2-3 sessions of strengthening per week.  Also advised to increase calories a day that she is working out particularly if she is feeling hungry.   Stacey Santiago  will stay at 5 mg once a week.  Orders: -     Tirzepatide -Weight Management; Inject 5 mg into the skin once a week.  Dispense: 2 mL; Refill: 0  Prediabetes Assessment & Plan: Stable.  We will check fasting blood glucose, hemoglobin A1c and insulin  levels fasting at the next office visit.  Continue Stacey Santiago  for pharmacoprophylaxis.  Continue maintaining a diet with a low glycemic load.  Orders: -     Tirzepatide -Weight Management; Inject 5 mg into the skin once a week.  Dispense: 2 mL; Refill: 0  Abnormal food appetite Assessment & Plan: Improved on Stacey Santiago .  She had increased Stacey signaling, impaired satiety and inhibitory Santiago. This is secondary to an abnormal energy regulation system and pathological neurohormonal pathways characteristic of excess adiposity.  In addition to nutritional and behavioral strategies she benefits from ongoing pharmacotherapy.  She will continue Stacey Santiago  at current dose       Objective   Physical Exam:  Blood pressure 104/73, pulse 76, temperature 98.1 F (36.7 C), height 5' 5 (1.651 m), weight 213 lb (96.6 kg), last menstrual period 01/10/2024, SpO2 100%. Body mass index is 35.45 kg/m.  General: She is overweight, cooperative, alert, well developed, and in no acute distress. PSYCH: Has normal mood, affect and thought process.   HEENT: EOMI, sclerae are anicteric. Lungs: Normal breathing effort, no conversational dyspnea. Extremities: No edema.  Neurologic: No gross sensory or motor deficits. No tremors or fasciculations noted.    Diagnostic Data Reviewed:  BMET    Component Value Date/Time   NA 139 05/01/2023 0901   NA 141 06/30/2021 1036   K 4.1 05/01/2023 0901   CL 105 05/01/2023 0901   CO2 23 05/01/2023 0901   GLUCOSE 89 05/01/2023 0901   BUN 10 05/01/2023 0901   BUN 9 06/30/2021 1036   CREATININE 0.94 05/01/2023 0901   CALCIUM 9.1 05/01/2023 0901   GFRNONAA 76 10/20/2020 0937   GFRAA 87 10/20/2020 0937   Lab  Results  Component Value Date   HGBA1C 5.9 (H) 06/12/2023   HGBA1C 5.1 05/16/2017   Lab Results  Component Value Date   INSULIN  14.8 06/12/2023   Lab Results  Component Value Date   TSH 1.460 06/12/2023   CBC    Component Value Date/Time   WBC 6.5 05/01/2023 0901   RBC 4.14 05/01/2023 0901   HGB 12.0 05/01/2023 0901   HGB 12.3 06/30/2021 1036   HCT 36.6 05/01/2023 0901   HCT 36.7 06/30/2021 1036   PLT 360.0 05/01/2023 0901   PLT 334 06/30/2021 1036   MCV 88.4 05/01/2023 0901   MCV 87 06/30/2021 1036   MCH 29.1 06/30/2021 1036   MCH 31.4 06/10/2016 0601   MCHC 32.8 05/01/2023 0901   RDW 14.8 05/01/2023 0901   RDW 13.2 06/30/2021 1036   Iron Studies    Component Value Date/Time   IRON 38 (L) 05/01/2023 0901   TIBC 550.2 (H) 05/01/2023 0901   FERRITIN 10.4 05/01/2023 0901   IRONPCTSAT 6.9 (L) 05/01/2023 0901   Lipid Panel     Component Value Date/Time   CHOL 192 05/01/2023 0901   CHOL 227 (H) 06/30/2021 1036   TRIG 91.0 05/01/2023 0901  HDL 68.10 05/01/2023 0901   HDL 69 06/30/2021 1036   CHOLHDL 3 05/01/2023 0901   VLDL 18.2 05/01/2023 0901   LDLCALC 106 (H) 05/01/2023 0901   LDLCALC 143 (H) 06/30/2021 1036   Hepatic Function Panel     Component Value Date/Time   PROT 6.7 04/21/2022 0815   PROT 6.9 06/30/2021 1036   ALBUMIN 3.9 04/21/2022 0815   ALBUMIN 4.3 06/30/2021 1036   AST 13 04/21/2022 0815   ALT 10 04/21/2022 0815   ALKPHOS 62 04/21/2022 0815   BILITOT 0.3 04/21/2022 0815   BILITOT 0.2 06/30/2021 1036      Component Value Date/Time   TSH 1.460 06/12/2023 0931   Nutritional Lab Results  Component Value Date   VD25OH 40.23 05/01/2023   VD25OH 45.21 10/23/2022   VD25OH 26.28 (L) 04/21/2022    Follow-Up   Return in about 4 weeks (around 02/12/2024) for For Weight Mangement with Dr. [email or web address].SABRA She was informed of the importance of frequent follow up visits to maximize her success with intensive lifestyle modifications for her multiple  health conditions.  Attestation Statement   Reviewed by clinician on day of visit: allergies, medications, problem list, medical history, surgical history, family history, social history, and previous encounter notes.     Lucas Francyne, MD

## 2024-01-15 NOTE — Assessment & Plan Note (Signed)
Stable.  We will check fasting blood glucose, hemoglobin A1c and insulin levels fasting at the next office visit.  Continue Zepbound for pharmacoprophylaxis.  Continue maintaining a diet with a low glycemic load.

## 2024-02-12 ENCOUNTER — Encounter (INDEPENDENT_AMBULATORY_CARE_PROVIDER_SITE_OTHER): Payer: Self-pay | Admitting: Internal Medicine

## 2024-02-12 ENCOUNTER — Ambulatory Visit (INDEPENDENT_AMBULATORY_CARE_PROVIDER_SITE_OTHER): Payer: 59 | Admitting: Internal Medicine

## 2024-02-12 VITALS — BP 116/79 | HR 70 | Temp 98.7°F | Ht 65.0 in | Wt 210.0 lb

## 2024-02-12 DIAGNOSIS — E538 Deficiency of other specified B group vitamins: Secondary | ICD-10-CM | POA: Diagnosis not present

## 2024-02-12 DIAGNOSIS — D500 Iron deficiency anemia secondary to blood loss (chronic): Secondary | ICD-10-CM | POA: Diagnosis not present

## 2024-02-12 DIAGNOSIS — E66812 Obesity, class 2: Secondary | ICD-10-CM

## 2024-02-12 DIAGNOSIS — Z6839 Body mass index (BMI) 39.0-39.9, adult: Secondary | ICD-10-CM

## 2024-02-12 DIAGNOSIS — R7303 Prediabetes: Secondary | ICD-10-CM | POA: Diagnosis not present

## 2024-02-12 MED ORDER — TIRZEPATIDE-WEIGHT MANAGEMENT 5 MG/0.5ML ~~LOC~~ SOAJ: 5.0000 mg | SUBCUTANEOUS | 0 refills | Status: DC

## 2024-02-12 NOTE — Progress Notes (Signed)
 Office: [phone]  /  Fax: [phone]  Weight Summary And Biometrics  Vitals Temp: 98.7 F (37.1 C) BP: 116/79 Pulse Rate: 70 SpO2: 100 %   Anthropometric Measurements Height: 5\' 5"  (1.651 m) Weight: 210 lb (95.3 kg) BMI (Calculated): 34.95 Weight at Last Visit: 213 lb Weight Lost Since Last Visit: 3 lb Weight Gained Since Last Visit: 0 Starting Weight: 239 lb Total Weight Loss (lbs): 29 lb (13.2 kg) Peak Weight: 242 lb   Body Composition  Body Fat %: 42 % Fat Mass (lbs): 88.6 lbs Muscle Mass (lbs): 116 lbs Total Body Water (lbs): 83.4 lbs Visceral Fat Rating : 10    No data recorded Today's Visit #: 10  Starting Date: 06/12/23   Subjective   Chief Complaint: Obesity  Interval History Discussed the use of AI scribe software for clinical note transcription with the patient, who gave verbal consent to proceed.  History of Present Illness   Stacey Santiago is a 43 year old female who presents for medical weight management.  She has lost three pounds since her last visit and a total of thirty pounds since starting the program in July, beginning at a weight of 239 pounds. She follows the category two plan 70-80% of the time, focusing on whole foods and adequate protein intake. She maintains hydration, consumes processed foods infrequently, and does not skip meals.  Frequent work travel presents challenges in maintaining her diet. She makes conscious choices to select healthier options, such as choosing vegetables over fries and grilled chicken over fried options. Her daughter's athletic involvement influences her meal choices, and she prepares meals in advance to avoid fast food, especially during busy sports schedules.  She engages in physical activity by walking four days a week for about thirty minutes and doing strengthening exercises at home.  She is currently on a maintenance dose of medication for weight management, which she started in October. She notes  that she does not feel as full as she used to, but she is still losing weight and managing her hunger effectively.        Challenges affecting patient progress: multiple competing priorities.    Pharmacotherapy for weight management: She is currently taking Zepbound with adequate clinical response  and without side effects..   Assessment and Plan   Treatment Plan For Obesity:  Recommended Dietary Goals  Betsi is currently in the action stage of change. As such, her goal is to continue weight management plan. She has agreed to: continue current plan  Behavioral Health and Counseling  We discussed the following behavioral modification strategies today: continue to work on maintaining a reduced calorie state, getting the recommended amount of protein, incorporating whole foods, making healthy choices, staying well hydrated and practicing mindfulness when eating..  Additional education and resources provided today: None  Recommended Physical Activity Goals  Mattea has been advised to work up to 150 minutes of moderate intensity aerobic activity a week and strengthening exercises 2-3 times per week for cardiovascular health, weight loss maintenance and preservation of muscle mass.   She has agreed to :  continue to gradually increase the amount and intensity of exercise routine  Pharmacotherapy  We discussed various medication options to help Deeksha with her weight loss efforts and we both agreed to : adequate clinical response to current dose, continue current regimen  Associated Conditions Impacted by Obesity Treatment  B12 deficiency Assessment & Plan: On B12 supplementation, check B12 levels today.  Orders: -  Vitamin B12  Class 2 obesity without serious comorbidity with body mass index (BMI) of 39.0 to 39.9 in adult, unspecified obesity type Assessment & Plan: Patient has lost three pounds since the last visit, totaling thirty pounds since July. She adheres to the  category two plan 70-80% of the time, consuming more whole foods, maintaining hydration, and limiting processed foods. She walks four days a week for thirty minutes and performs strengthening exercises at home. She reports increased hunger and concerns about the current medication dose.  Emphasized the importance of consistency and a healthy weight loss rate of 3-8 pounds per month. - Continue dietary plan focusing on whole foods, adequate protein, and balanced meals. - Maintain physical activity with a goal of 115 minutes of aerobic exercise and 2-3 strengthening sessions per week. - Consider increasing Zepbound to 7.5 mg if current dose is ineffective in controlling hunger. Monitor for constipation and nausea. - Reassess medication dose in one month.  Orders: -     Tirzepatide-Weight Management; Inject 5 mg into the skin once a week.  Dispense: 2 mL; Refill: 0  Prediabetes Assessment & Plan: Stable.  We will check fasting blood glucose, hemoglobin A1c and insulin levels fasting today.  Continue Zepbound for pharmacoprophylaxis.    Orders: -     Tirzepatide-Weight Management; Inject 5 mg into the skin once a week.  Dispense: 2 mL; Refill: 0 -     Hemoglobin A1c -     Insulin, random  Iron deficiency anemia due to chronic blood loss Assessment & Plan: Patient is on an iron supplement. Effectiveness has not been recently assessed. - Order iron panel to assess current iron levels.  Orders: -     Iron and TIBC -     Ferritin            Objective   Physical Exam:  Blood pressure 116/79, pulse 70, temperature 98.7 F (37.1 C), height 5\' 5"  (1.651 m), weight 210 lb (95.3 kg), last menstrual period 01/10/2024, SpO2 100%. Body mass index is 34.95 kg/m.  General: She is overweight, cooperative, alert, well developed, and in no acute distress. PSYCH: Has normal mood, affect and thought process.   HEENT: EOMI, sclerae are anicteric. Lungs: Normal breathing effort, no conversational  dyspnea. Extremities: No edema.  Neurologic: No gross sensory or motor deficits. No tremors or fasciculations noted.    Diagnostic Data Reviewed:  BMET    Component Value Date/Time   NA 139 05/01/2023 0901   NA 141 06/30/2021 1036   K 4.1 05/01/2023 0901   CL 105 05/01/2023 0901   CO2 23 05/01/2023 0901   GLUCOSE 89 05/01/2023 0901   BUN 10 05/01/2023 0901   BUN 9 06/30/2021 1036   CREATININE 0.94 05/01/2023 0901   CALCIUM 9.1 05/01/2023 0901   GFRNONAA 76 10/20/2020 0937   GFRAA 87 10/20/2020 0937   Lab Results  Component Value Date   HGBA1C 5.9 (H) 06/12/2023   HGBA1C 5.1 05/16/2017   Lab Results  Component Value Date   INSULIN 14.8 06/12/2023   Lab Results  Component Value Date   TSH 1.460 06/12/2023   CBC    Component Value Date/Time   WBC 6.5 05/01/2023 0901   RBC 4.14 05/01/2023 0901   HGB 12.0 05/01/2023 0901   HGB 12.3 06/30/2021 1036   HCT 36.6 05/01/2023 0901   HCT 36.7 06/30/2021 1036   PLT 360.0 05/01/2023 0901   PLT 334 06/30/2021 1036   MCV 88.4 05/01/2023 0901  MCV 87 06/30/2021 1036   MCH 29.1 06/30/2021 1036   MCH 31.4 06/10/2016 0601   MCHC 32.8 05/01/2023 0901   RDW 14.8 05/01/2023 0901   RDW 13.2 06/30/2021 1036   Iron Studies    Component Value Date/Time   IRON 38 (L) 05/01/2023 0901   TIBC 550.2 (H) 05/01/2023 0901   FERRITIN 10.4 05/01/2023 0901   IRONPCTSAT 6.9 (L) 05/01/2023 0901   Lipid Panel     Component Value Date/Time   CHOL 192 05/01/2023 0901   CHOL 227 (H) 06/30/2021 1036   TRIG 91.0 05/01/2023 0901   HDL 68.10 05/01/2023 0901   HDL 69 06/30/2021 1036   CHOLHDL 3 05/01/2023 0901   VLDL 18.2 05/01/2023 0901   LDLCALC 106 (H) 05/01/2023 0901   LDLCALC 143 (H) 06/30/2021 1036   Hepatic Function Panel     Component Value Date/Time   PROT 6.7 04/21/2022 0815   PROT 6.9 06/30/2021 1036   ALBUMIN 3.9 04/21/2022 0815   ALBUMIN 4.3 06/30/2021 1036   AST 13 04/21/2022 0815   ALT 10 04/21/2022 0815   ALKPHOS 62  04/21/2022 0815   BILITOT 0.3 04/21/2022 0815   BILITOT 0.2 06/30/2021 1036      Component Value Date/Time   TSH 1.460 06/12/2023 0931   Nutritional Lab Results  Component Value Date   VD25OH 40.23 05/01/2023   VD25OH 45.21 10/23/2022   VD25OH 26.28 (L) 04/21/2022    Medications: Outpatient Encounter Medications as of 02/12/2024  Medication Sig   cyanocobalamin (VITAMIN B12) 1000 MCG tablet Take 1 tablet (1,000 mcg total) by mouth every other day.   desogestrel-ethinyl estradiol (APRI) 0.15-30 MG-MCG tablet Take 1 tablet by mouth daily.   escitalopram (LEXAPRO) 10 MG tablet Take 1 tablet (10 mg total) by mouth daily. Please schedule office visit before any future refill.   [DISCONTINUED] tirzepatide (ZEPBOUND) 5 MG/0.5ML Pen Inject 5 mg into the skin once a week.   tirzepatide (ZEPBOUND) 5 MG/0.5ML Pen Inject 5 mg into the skin once a week.   No facility-administered encounter medications on file as of 02/12/2024.     Follow-Up   Return in about 4 weeks (around 03/11/2024) for For Weight Mangement with Dr. Rikki Spearing.Marland Kitchen She was informed of the importance of frequent follow up visits to maximize her success with intensive lifestyle modifications for her multiple health conditions.  Attestation Statement   Reviewed by clinician on day of visit: allergies, medications, problem list, medical history, surgical history, family history, social history, and previous encounter notes.     Worthy Rancher, MD

## 2024-02-12 NOTE — Assessment & Plan Note (Signed)
 Patient is on an iron supplement. Effectiveness has not been recently assessed. - Order iron panel to assess current iron levels.

## 2024-02-12 NOTE — Assessment & Plan Note (Signed)
 Patient has lost three pounds since the last visit, totaling thirty pounds since July. She adheres to the category two plan 70-80% of the time, consuming more whole foods, maintaining hydration, and limiting processed foods. She walks four days a week for thirty minutes and performs strengthening exercises at home. She reports increased hunger and concerns about the current medication dose.  Emphasized the importance of consistency and a healthy weight loss rate of 3-8 pounds per month. - Continue dietary plan focusing on whole foods, adequate protein, and balanced meals. - Maintain physical activity with a goal of 115 minutes of aerobic exercise and 2-3 strengthening sessions per week. - Consider increasing Zepbound to 7.5 mg if current dose is ineffective in controlling hunger. Monitor for constipation and nausea. - Reassess medication dose in one month.

## 2024-02-12 NOTE — Assessment & Plan Note (Signed)
 Stable.  We will check fasting blood glucose, hemoglobin A1c and insulin levels fasting today.  Continue Zepbound for pharmacoprophylaxis.

## 2024-02-12 NOTE — Assessment & Plan Note (Signed)
 On B12 supplementation, check B12 levels today.

## 2024-02-13 LAB — VITAMIN B12: Vitamin B-12: 641 pg/mL (ref 232–1245)

## 2024-02-13 LAB — HEMOGLOBIN A1C
Est. average glucose Bld gHb Est-mCnc: 97 mg/dL
Hgb A1c MFr Bld: 5 % (ref 4.8–5.6)

## 2024-02-13 LAB — IRON AND TIBC
Iron Saturation: 16 % (ref 15–55)
Iron: 70 ug/dL (ref 27–159)
Total Iron Binding Capacity: 435 ug/dL (ref 250–450)
UIBC: 365 ug/dL (ref 131–425)

## 2024-02-13 LAB — INSULIN, RANDOM: INSULIN: 12.3 u[IU]/mL (ref 2.6–24.9)

## 2024-02-13 LAB — FERRITIN: Ferritin: 28 ng/mL (ref 15–150)

## 2024-02-14 ENCOUNTER — Encounter (INDEPENDENT_AMBULATORY_CARE_PROVIDER_SITE_OTHER): Payer: Self-pay | Admitting: Internal Medicine

## 2024-03-12 ENCOUNTER — Encounter (INDEPENDENT_AMBULATORY_CARE_PROVIDER_SITE_OTHER): Payer: Self-pay | Admitting: Internal Medicine

## 2024-03-12 ENCOUNTER — Ambulatory Visit (INDEPENDENT_AMBULATORY_CARE_PROVIDER_SITE_OTHER): Admitting: Internal Medicine

## 2024-03-12 VITALS — BP 101/69 | HR 71 | Temp 98.1°F | Ht 65.0 in | Wt 203.0 lb

## 2024-03-12 DIAGNOSIS — R638 Other symptoms and signs concerning food and fluid intake: Secondary | ICD-10-CM

## 2024-03-12 DIAGNOSIS — Z6839 Body mass index (BMI) 39.0-39.9, adult: Secondary | ICD-10-CM

## 2024-03-12 DIAGNOSIS — E538 Deficiency of other specified B group vitamins: Secondary | ICD-10-CM | POA: Diagnosis not present

## 2024-03-12 DIAGNOSIS — R7303 Prediabetes: Secondary | ICD-10-CM

## 2024-03-12 DIAGNOSIS — E66812 Obesity, class 2: Secondary | ICD-10-CM

## 2024-03-12 DIAGNOSIS — E785 Hyperlipidemia, unspecified: Secondary | ICD-10-CM

## 2024-03-12 MED ORDER — TIRZEPATIDE-WEIGHT MANAGEMENT 5 MG/0.5ML ~~LOC~~ SOAJ: 5.0000 mg | SUBCUTANEOUS | 1 refills | Status: DC

## 2024-03-12 NOTE — Progress Notes (Signed)
 Office: [phone]  /  Fax: [phone]  Weight Summary And Biometrics  Vitals Temp: 98.1 F (36.7 C) BP: 101/69 Pulse Rate: 71 SpO2: 100 %   Anthropometric Measurements Height: 5\' 5"  (1.651 m) Weight: 203 lb (92.1 kg) BMI (Calculated): 33.78 Weight at Last Visit: 210 lb Weight Lost Since Last Visit: 7 lb Weight Gained Since Last Visit: 0 lb Starting Weight: 239 lb Total Weight Loss (lbs): 36 lb (16.3 kg) Peak Weight: 242 lb   Body Composition  Body Fat %: 39.2 % Fat Mass (lbs): 79.8 lbs Muscle Mass (lbs): 117.6 lbs Total Body Water (lbs): 80 lbs Visceral Fat Rating : 9    No data recorded Today's Visit #: 11  Starting Date: 06/12/23   Subjective   Chief Complaint: Obesity  Interval History Discussed the use of AI scribe software for clinical note transcription with the patient, who gave verbal consent to proceed.  History of Present Illness Stacey Santiago is a 43 year old female with obesity, hyperlipidemia, and prediabetes who presents for medical weight management.  She has a history of obesity, hyperlipidemia, and prediabetes. Her hemoglobin A1c has improved from 5.9% to 5.0% as of March 04, 2024. She has lost seven pounds since her last visit and is following a category two meal plan 70-80% of the time. She is not tracking calories but is eating more whole foods, getting the recommended amount of protein, and denies skipping meals. She exercises five days a week for about 30 minutes, including strengthening and cardio exercises. She maintains adequate sleep and denies high levels of stress.  She is currently being treated with Zepbound for her conditions. Her appetite is controlled, and she feels fuller quicker, which helps prevent overeating.  Her peak weight was 242 pounds, and she has lost 17% of her body weight. Her body fat percentage has decreased from 44% to 39%, and her visceral fat has decreased from 12 to 9. She has gained muscle mass, increasing  to 117 pounds. She is more active, especially with the nicer weather, and enjoys spending time outside with her daughter.  She is considering the impact of upcoming travel on her weight management plan, as she will be traveling for work to [place], [place], [place], and [place] between May and June. She plans to stay active during her travels and make healthier food choices.  Her B12 levels have improved from 189 to 641, and she will continue B12 supplementation.    Challenges affecting patient progress: none.    Pharmacotherapy for weight management: She is currently taking Zepbound with adequate clinical response  and without side effects..   Assessment and Plan   Treatment Plan For Obesity:  Recommended Dietary Goals  Stacey Santiago is currently in the action stage of change. As such, her goal is to continue weight management plan. She has agreed to: follow the Category 2 plan - 1200 kcal per day  Behavioral Health and Counseling  We discussed the following behavioral modification strategies today: continue to work on maintaining a reduced calorie state, getting the recommended amount of protein, incorporating whole foods, making healthy choices, staying well hydrated and practicing mindfulness when eating..  Additional education and resources provided today: None  Recommended Physical Activity Goals  Stacey Santiago has been advised to work up to 150 minutes of moderate intensity aerobic activity a week and strengthening exercises 2-3 times per week for cardiovascular health, weight loss maintenance and preservation of muscle mass.   She has agreed to :  Increase the intensity,  frequency or duration of strengthening exercises  and Increase the intensity, frequency or duration of aerobic exercises    Pharmacotherapy  We discussed various medication options to help Stacey Santiago with her weight loss efforts and we both agreed to : adequate clinical response to current dose, continue current  regimen  Associated Conditions Impacted by Obesity Treatment  Abnormal food appetite  Class 2 obesity without serious comorbidity with body mass index (BMI) of 39.0 to 39.9 in adult, unspecified obesity type -     Tirzepatide-Weight Management; Inject 5 mg into the skin once a week.  Dispense: 2 mL; Refill: 1  Prediabetes -     Tirzepatide-Weight Management; Inject 5 mg into the skin once a week.  Dispense: 2 mL; Refill: 1  B12 deficiency    Assessment and Plan Assessment & Plan Obesity She is undergoing medical weight management and has lost 7 pounds since the last visit. Improvements include a reduction in body fat percentage from 44% to 39% and a decrease in visceral fat from 12 to 9. Muscle mass has increased by almost 2 pounds. She adheres to a category two meal plan 70-80% of the time, consumes more whole foods, and exercises five days a week. She is on GLP-1 receptor agonist therapy (Zepbound) and tolerates the current dose well without side effects. Her sensitivity to physical activity has contributed to weight loss and body composition improvements. She plans to maintain her physical activity and dietary habits during upcoming travel, which includes extensive walking opportunities. - Continue current dose of Zepbound. - Encourage adherence to meal plan and exercise regimen. - Advise maintaining physical activity during travel. - Provide refill for Zepbound and advise on obtaining a vacation supply from insurance. - Instruct to keep medication refrigerated during travel.  Abnormal food appetite Managed with GLP-1 receptor agonist therapy (Zepbound), which aids in appetite control and reduces food noise, making her feel fuller quicker and preventing overeating. - Continue current dose of zepbound for appetite control.  Prediabetes Improving, as evidenced by a decrease in hemoglobin A1c from 5.9% to 5.0% as of March 04, 2024. This improvement is attributed to her current weight  loss plan and lifestyle modifications.  Continue set back for pharmacal prophylaxis   Hyperlipidemia Associated with obesity and managed with lifestyle modifications and medication (Zepbound). - Continue current management with lifestyle modifications and medication. -Check fasting lipid profile in June  Vitamin B12 deficiency B12 levels have improved from 189 to 641 with supplementation. She will continue B12 supplementation to maintain adequate levels. - Continue B12 supplementation.  Follow-up She will be traveling extensively between May and June, which may impact her routine. She plans to maintain her physical activity and dietary habits during travel. - Plan follow-up appointment in about a month, adjusting for travel schedule.      Objective   Physical Exam:  Blood pressure 101/69, pulse 71, temperature 98.1 F (36.7 C), height 5\' 5"  (1.651 m), weight 203 lb (92.1 kg), last menstrual period 03/06/2024, SpO2 100%. Body mass index is 33.78 kg/m.  General: She is overweight, cooperative, alert, well developed, and in no acute distress. PSYCH: Has normal mood, affect and thought process.   HEENT: EOMI, sclerae are anicteric. Lungs: Normal breathing effort, no conversational dyspnea. Extremities: No edema.  Neurologic: No gross sensory or motor deficits. No tremors or fasciculations noted.    Diagnostic Data Reviewed:  BMET    Component Value Date/Time   NA 139 05/01/2023 0901   NA 141 06/30/2021 1036   K  4.1 05/01/2023 0901   CL 105 05/01/2023 0901   CO2 23 05/01/2023 0901   GLUCOSE 89 05/01/2023 0901   BUN 10 05/01/2023 0901   BUN 9 06/30/2021 1036   CREATININE 0.94 05/01/2023 0901   CALCIUM 9.1 05/01/2023 0901   GFRNONAA 76 10/20/2020 0937   GFRAA 87 10/20/2020 0937   Lab Results  Component Value Date   HGBA1C 5.0 02/12/2024   HGBA1C 5.1 05/16/2017   Lab Results  Component Value Date   INSULIN 12.3 02/12/2024   INSULIN 14.8 06/12/2023   Lab Results   Component Value Date   TSH 1.460 06/12/2023   CBC    Component Value Date/Time   WBC 6.5 05/01/2023 0901   RBC 4.14 05/01/2023 0901   HGB 12.0 05/01/2023 0901   HGB 12.3 06/30/2021 1036   HCT 36.6 05/01/2023 0901   HCT 36.7 06/30/2021 1036   PLT 360.0 05/01/2023 0901   PLT 334 06/30/2021 1036   MCV 88.4 05/01/2023 0901   MCV 87 06/30/2021 1036   MCH 29.1 06/30/2021 1036   MCH 31.4 06/10/2016 0601   MCHC 32.8 05/01/2023 0901   RDW 14.8 05/01/2023 0901   RDW 13.2 06/30/2021 1036   Iron Studies    Component Value Date/Time   IRON 70 02/12/2024 1116   TIBC 435 02/12/2024 1116   FERRITIN 28 02/12/2024 1116   IRONPCTSAT 16 02/12/2024 1116   Lipid Panel     Component Value Date/Time   CHOL 192 05/01/2023 0901   CHOL 227 (H) 06/30/2021 1036   TRIG 91.0 05/01/2023 0901   HDL 68.10 05/01/2023 0901   HDL 69 06/30/2021 1036   CHOLHDL 3 05/01/2023 0901   VLDL 18.2 05/01/2023 0901   LDLCALC 106 (H) 05/01/2023 0901   LDLCALC 143 (H) 06/30/2021 1036   Hepatic Function Panel     Component Value Date/Time   PROT 6.7 04/21/2022 0815   PROT 6.9 06/30/2021 1036   ALBUMIN 3.9 04/21/2022 0815   ALBUMIN 4.3 06/30/2021 1036   AST 13 04/21/2022 0815   ALT 10 04/21/2022 0815   ALKPHOS 62 04/21/2022 0815   BILITOT 0.3 04/21/2022 0815   BILITOT 0.2 06/30/2021 1036      Component Value Date/Time   TSH 1.460 06/12/2023 0931   Nutritional Lab Results  Component Value Date   VD25OH 40.23 05/01/2023   VD25OH 45.21 10/23/2022   VD25OH 26.28 (L) 04/21/2022    Medications: Outpatient Encounter Medications as of 03/12/2024  Medication Sig   cyanocobalamin (VITAMIN B12) 1000 MCG tablet Take 1 tablet (1,000 mcg total) by mouth every other day.   desogestrel-ethinyl estradiol (APRI) 0.15-30 MG-MCG tablet Take 1 tablet by mouth daily.   escitalopram (LEXAPRO) 10 MG tablet Take 1 tablet (10 mg total) by mouth daily. Please schedule office visit before any future refill.    [DISCONTINUED] tirzepatide (ZEPBOUND) 5 MG/0.5ML Pen Inject 5 mg into the skin once a week.   tirzepatide (ZEPBOUND) 5 MG/0.5ML Pen Inject 5 mg into the skin once a week.   No facility-administered encounter medications on file as of 03/12/2024.     Follow-Up   Return in about 4 weeks (around 04/09/2024) for For Weight Mangement with Dr. Rikki Spearing.Marland Kitchen She was informed of the importance of frequent follow up visits to maximize her success with intensive lifestyle modifications for her multiple health conditions.  Attestation Statement   Reviewed by clinician on day of visit: allergies, medications, problem list, medical history, surgical history, family history, social history, and previous encounter  notes.     Worthy Rancher, MD

## 2024-05-06 ENCOUNTER — Telehealth: Payer: Self-pay | Admitting: *Deleted

## 2024-05-06 ENCOUNTER — Ambulatory Visit (INDEPENDENT_AMBULATORY_CARE_PROVIDER_SITE_OTHER): Admitting: Internal Medicine

## 2024-05-06 ENCOUNTER — Encounter (INDEPENDENT_AMBULATORY_CARE_PROVIDER_SITE_OTHER): Payer: Self-pay | Admitting: Internal Medicine

## 2024-05-06 VITALS — BP 92/67 | HR 85 | Ht 65.0 in | Wt 194.0 lb

## 2024-05-06 DIAGNOSIS — Z3009 Encounter for other general counseling and advice on contraception: Secondary | ICD-10-CM

## 2024-05-06 DIAGNOSIS — E66812 Obesity, class 2: Secondary | ICD-10-CM | POA: Diagnosis not present

## 2024-05-06 DIAGNOSIS — R638 Other symptoms and signs concerning food and fluid intake: Secondary | ICD-10-CM | POA: Diagnosis not present

## 2024-05-06 DIAGNOSIS — R7303 Prediabetes: Secondary | ICD-10-CM

## 2024-05-06 DIAGNOSIS — Z6839 Body mass index (BMI) 39.0-39.9, adult: Secondary | ICD-10-CM

## 2024-05-06 MED ORDER — MULTI-VITAMIN/MINERALS PO TABS: 1.0000 | ORAL_TABLET | Freq: Every day | ORAL | Status: AC

## 2024-05-06 MED ORDER — TIRZEPATIDE-WEIGHT MANAGEMENT 5 MG/0.5ML ~~LOC~~ SOAJ: 5.0000 mg | SUBCUTANEOUS | 1 refills | Status: DC

## 2024-05-06 NOTE — Progress Notes (Signed)
 Office: [phone]  /  Fax: [phone]  Weight Summary And Biometrics  Vitals BP: 92/67 Pulse Rate: 85 SpO2: 98 %   Anthropometric Measurements Height: 5\' 5"  (1.651 m) Weight: 194 lb (88 kg) BMI (Calculated): 32.28 Weight at Last Visit: 203lb Weight Lost Since Last Visit: 9lb Weight Gained Since Last Visit: 0lb Starting Weight: 239lb Total Weight Loss (lbs): 45 lb (20.4 kg) Peak Weight: 242lb   Body Composition  Body Fat %: 39.1 % Fat Mass (lbs): 76 lbs Muscle Mass (lbs): 112.4 lbs Total Body Water (lbs): 79 lbs Visceral Fat Rating : 9    No data recorded Today's Visit #: 12  Starting Date: 06/12/23   Subjective   Chief Complaint: Obesity  Interval History Discussed the use of AI scribe software for clinical note transcription with the patient, who gave verbal consent to proceed.  History of Present Illness   Stacey Santiago is a 43 year old female who presents for medical weight management.  Since her last visit, she has lost nine pounds. She adheres to a 1200 calorie meal plan 70-80% of the time, focusing on whole foods and adequate protein intake, and avoids skipping meals. She exercises three days a week for about 30 minutes each session.  She experiences challenges maintaining her diet during work-related travel due to frequent dining out. She manages this by consuming protein bars for breakfast and making healthier choices when eating out. Her meals have become smaller, and she feels fuller quicker, leading to not finishing some meals.  She has not been engaging in as much weight training, focusing more on cardio, especially during her travels. She has not been consuming her usual protein shakes over the past few weeks due to travel, which typically provide her with 30 grams of protein per serving. She plans to resume this routine.  She is currently taking Zepbound  at a dose of 5 mg. She has not been taking a multivitamin with minerals recently but has  one available to start using.  No high levels of stress and adequate sleep. No skipped meals, and meals have become smaller, leading to a quicker sense of fullness.       Challenges affecting patient progress: low volume of physical activity at present  and frequent travel.    Pharmacotherapy for weight management: She is currently taking Zepbound  with adequate clinical response  and without side effects..   Assessment and Plan   Treatment Plan For Obesity:  Recommended Dietary Goals  Akyah is currently in the action stage of change. As such, her goal is to continue weight management plan. She has agreed to: continue current plan  Behavioral Health and Counseling  We discussed the following behavioral modification strategies today: staying on track while traveling and vacationing and continue to work on maintaining a reduced calorie state, getting the recommended amount of protein, incorporating whole foods, making healthy choices, staying well hydrated and practicing mindfulness when eating..  Additional education and resources provided today: None  Recommended Physical Activity Goals  Runell has been advised to work up to 150 minutes of moderate intensity aerobic activity a week and strengthening exercises 2-3 times per week for cardiovascular health, weight loss maintenance and preservation of muscle mass.   She has agreed to :  continue to gradually increase the amount and intensity of exercise routine  Pharmacotherapy  We discussed various medication options to help Waldine with her weight loss efforts and we both agreed to : adequate clinical response to anti-obesity medication, continue current regimen  Associated Conditions Impacted by Obesity Treatment  Abnormal food appetite Assessment & Plan: Improved on Zepbound .  She had increased orexigenic signaling, impaired satiety and inhibitory control. This is secondary to an abnormal energy regulation system and pathological  neurohormonal pathways characteristic of excess adiposity.  In addition to nutritional and behavioral strategies she benefits from ongoing pharmacotherapy.  She will continue Zepbound  at current dose    Class 2 obesity without serious comorbidity with body mass index (BMI) of 39.0 to 39.9 in adult, unspecified obesity type Assessment & Plan: Obesity management is ongoing with a focus on weight reduction. Since the last visit, there has been a weight loss of nine pounds. Current strategies include a 1200 calorie meal plan, increased whole food intake, adequate protein consumption, and regular exercise. She is on GLP-1 receptor agonist therapy (Zepbound ) at a dose of 5 mg, which has been effective in weight reduction. It is important to maintain a caloric intake above 1200 calories to prevent muscle loss and ensure adequate protein intake to support muscle mass. Strength training exercises are recommended to preserve muscle mass. She is aware of the potential for decreased contraceptive effectiveness with Zepbound  and is using reliable contraception. The goal is to continue weight loss while preserving muscle mass and improving body composition. - Continue Zepbound  at 5 mg. - Advise maintaining caloric intake above 1200 calories. - Encourage adequate protein intake, aiming for 30 grams per meal. - Recommend strength training exercises. - Start taking a multivitamin with minerals daily. - Ensure reliable contraception is used. Muscle loss There is a decrease in muscle mass, likely due to insufficient caloric and protein intake while on a weight loss regimen with GLP-1 receptor agonist therapy. She has experienced a significant weight loss, which includes muscle loss. The importance of maintaining adequate caloric and protein intake to preserve muscle mass was discussed. Prioritizing strength training exercises is advised to mitigate muscle loss. Monitoring muscle mass at the next visit will help determine  if this is a trend or a variation. - Encourage strength training exercises. - Advise maintaining adequate protein intake, aiming for 30 grams per meal. - Monitor muscle mass at the next visit.   Orders: -     Tirzepatide -Weight Management; Inject 5 mg into the skin once a week.  Dispense: 2 mL; Refill: 1 -     Multi-Vitamin/Minerals; Take 1 tablet by mouth daily.  Prediabetes Assessment & Plan: Most recent A1c is  Lab Results  Component Value Date   HGBA1C 5.0 02/12/2024   HGBA1C 5.1 05/16/2017    And improved.  Continue current weight management strategy supported by Zepbound  for pharmacoprophylaxis.     Orders: -     Tirzepatide -Weight Management; Inject 5 mg into the skin once a week.  Dispense: 2 mL; Refill: 1  Family planning Assessment & Plan: We reviewed her concern regarding GLP-1 therapy and oral contraceptions.  GLP-1 treatment because of delayed gastric emptying may affect levels and effectiveness of OCP.  She reports using 2 methods of contraception.       Objective   Physical Exam:  Blood pressure 92/67, pulse 85, height 5\' 5"  (1.651 m), weight 194 lb (88 kg), SpO2 98%. Body mass index is 32.28 kg/m.  General: She is overweight, cooperative, alert, well developed, and in no acute distress. PSYCH: Has normal mood, affect and thought process.   HEENT: EOMI, sclerae are anicteric. Lungs: Normal breathing effort, no conversational dyspnea. Extremities: No edema.  Neurologic: No gross sensory or motor deficits. No tremors  or fasciculations noted.    Diagnostic Data Reviewed:  BMET    Component Value Date/Time   NA 139 05/01/2023 0901   NA 141 06/30/2021 1036   K 4.1 05/01/2023 0901   CL 105 05/01/2023 0901   CO2 23 05/01/2023 0901   GLUCOSE 89 05/01/2023 0901   BUN 10 05/01/2023 0901   BUN 9 06/30/2021 1036   CREATININE 0.94 05/01/2023 0901   CALCIUM 9.1 05/01/2023 0901   GFRNONAA 76 10/20/2020 0937   GFRAA 87 10/20/2020 0937   Lab Results   Component Value Date   HGBA1C 5.0 02/12/2024   HGBA1C 5.1 05/16/2017   Lab Results  Component Value Date   INSULIN  12.3 02/12/2024   INSULIN  14.8 06/12/2023   Lab Results  Component Value Date   TSH 1.460 06/12/2023   CBC    Component Value Date/Time   WBC 6.5 05/01/2023 0901   RBC 4.14 05/01/2023 0901   HGB 12.0 05/01/2023 0901   HGB 12.3 06/30/2021 1036   HCT 36.6 05/01/2023 0901   HCT 36.7 06/30/2021 1036   PLT 360.0 05/01/2023 0901   PLT 334 06/30/2021 1036   MCV 88.4 05/01/2023 0901   MCV 87 06/30/2021 1036   MCH 29.1 06/30/2021 1036   MCH 31.4 06/10/2016 0601   MCHC 32.8 05/01/2023 0901   RDW 14.8 05/01/2023 0901   RDW 13.2 06/30/2021 1036   Iron Studies    Component Value Date/Time   IRON 70 02/12/2024 1116   TIBC 435 02/12/2024 1116   FERRITIN 28 02/12/2024 1116   IRONPCTSAT 16 02/12/2024 1116   Lipid Panel     Component Value Date/Time   CHOL 192 05/01/2023 0901   CHOL 227 (H) 06/30/2021 1036   TRIG 91.0 05/01/2023 0901   HDL 68.10 05/01/2023 0901   HDL 69 06/30/2021 1036   CHOLHDL 3 05/01/2023 0901   VLDL 18.2 05/01/2023 0901   LDLCALC 106 (H) 05/01/2023 0901   LDLCALC 143 (H) 06/30/2021 1036   Hepatic Function Panel     Component Value Date/Time   PROT 6.7 04/21/2022 0815   PROT 6.9 06/30/2021 1036   ALBUMIN 3.9 04/21/2022 0815   ALBUMIN 4.3 06/30/2021 1036   AST 13 04/21/2022 0815   ALT 10 04/21/2022 0815   ALKPHOS 62 04/21/2022 0815   BILITOT 0.3 04/21/2022 0815   BILITOT 0.2 06/30/2021 1036      Component Value Date/Time   TSH 1.460 06/12/2023 0931   Nutritional Lab Results  Component Value Date   VD25OH 40.23 05/01/2023   VD25OH 45.21 10/23/2022   VD25OH 26.28 (L) 04/21/2022    Medications: Outpatient Encounter Medications as of 05/06/2024  Medication Sig   cyanocobalamin (VITAMIN B12) 1000 MCG tablet Take 1 tablet (1,000 mcg total) by mouth every other day.   desogestrel -ethinyl estradiol  (APRI ) 0.15-30 MG-MCG tablet  Take 1 tablet by mouth daily.   escitalopram  (LEXAPRO ) 10 MG tablet Take 1 tablet (10 mg total) by mouth daily. Please schedule office visit before any future refill.   Multiple Vitamins-Minerals (MULTIVITAMIN WITH MINERALS) tablet Take 1 tablet by mouth daily.   [DISCONTINUED] tirzepatide  (ZEPBOUND ) 5 MG/0.5ML Pen Inject 5 mg into the skin once a week.   tirzepatide  (ZEPBOUND ) 5 MG/0.5ML Pen Inject 5 mg into the skin once a week.   No facility-administered encounter medications on file as of 05/06/2024.     Follow-Up   Return in about 4 weeks (around 06/03/2024) for For Weight Mangement with Dr. Allie Area.Aaron Aas She was informed of the  importance of frequent follow up visits to maximize her success with intensive lifestyle modifications for her multiple health conditions.  Attestation Statement   Reviewed by clinician on day of visit: allergies, medications, problem list, medical history, surgical history, family history, social history, and previous encounter notes.     Ladd Picker, MD

## 2024-05-06 NOTE — Assessment & Plan Note (Signed)
 We reviewed her concern regarding GLP-1 therapy and oral contraceptions.  GLP-1 treatment because of delayed gastric emptying may affect levels and effectiveness of OCP.  She reports using 2 methods of contraception.

## 2024-05-06 NOTE — Assessment & Plan Note (Signed)
 Improved on Zepbound.  She had increased orexigenic signaling, impaired satiety and inhibitory control. This is secondary to an abnormal energy regulation system and pathological neurohormonal pathways characteristic of excess adiposity.  In addition to nutritional and behavioral strategies she benefits from ongoing pharmacotherapy.  She will continue Zepbound at current dose

## 2024-05-06 NOTE — Telephone Encounter (Signed)
 Prior authorization done via cover my meds for patients Zepbound. Waiting on determination.

## 2024-05-06 NOTE — Assessment & Plan Note (Addendum)
 Obesity management is ongoing with a focus on weight reduction. Since the last visit, there has been a weight loss of nine pounds. Current strategies include a 1200 calorie meal plan, increased whole food intake, adequate protein consumption, and regular exercise. She is on GLP-1 receptor agonist therapy (Zepbound ) at a dose of 5 mg, which has been effective in weight reduction. It is important to maintain a caloric intake above 1200 calories to prevent muscle loss and ensure adequate protein intake to support muscle mass. Strength training exercises are recommended to preserve muscle mass. She is aware of the potential for decreased contraceptive effectiveness with Zepbound  and is using reliable contraception. The goal is to continue weight loss while preserving muscle mass and improving body composition. - Continue Zepbound  at 5 mg. - Advise maintaining caloric intake above 1200 calories. - Encourage adequate protein intake, aiming for 30 grams per meal. - Recommend strength training exercises. - Start taking a multivitamin with minerals daily. - Ensure reliable contraception is used. Muscle loss There is a decrease in muscle mass, likely due to insufficient caloric and protein intake while on a weight loss regimen with GLP-1 receptor agonist therapy. She has experienced a significant weight loss, which includes muscle loss. The importance of maintaining adequate caloric and protein intake to preserve muscle mass was discussed. Prioritizing strength training exercises is advised to mitigate muscle loss. Monitoring muscle mass at the next visit will help determine if this is a trend or a variation. - Encourage strength training exercises. - Advise maintaining adequate protein intake, aiming for 30 grams per meal. - Monitor muscle mass at the next visit.

## 2024-05-06 NOTE — Assessment & Plan Note (Signed)
 Most recent A1c is  Lab Results  Component Value Date   HGBA1C 5.0 02/12/2024   HGBA1C 5.1 05/16/2017    And improved.  Continue current weight management strategy supported by Zepbound  for pharmacoprophylaxis.

## 2024-05-07 NOTE — Telephone Encounter (Signed)
 PA for Zepbound  5MG  has been approved. PA is now complete.     Message from Plan Your PA request has been approved. Additional information will be provided in the approval communication. (Message 1145). Authorization Expiration Date: May 06, 2025.

## 2024-06-10 ENCOUNTER — Other Ambulatory Visit: Payer: Self-pay | Admitting: Family

## 2024-06-10 DIAGNOSIS — Z3009 Encounter for other general counseling and advice on contraception: Secondary | ICD-10-CM

## 2024-06-11 NOTE — Telephone Encounter (Signed)
 [email or web address], sent mychart message

## 2024-06-12 NOTE — Telephone Encounter (Signed)
 Spoke to pt, sch cpe for 09/26/24. Pt asked could a referral be sent to [facility] in [place] for her mammogram? Pt states she's due for her next mammogram in Oct.

## 2024-06-16 ENCOUNTER — Encounter (INDEPENDENT_AMBULATORY_CARE_PROVIDER_SITE_OTHER): Payer: Self-pay | Admitting: Internal Medicine

## 2024-06-16 ENCOUNTER — Ambulatory Visit (INDEPENDENT_AMBULATORY_CARE_PROVIDER_SITE_OTHER): Admitting: Internal Medicine

## 2024-06-16 VITALS — BP 100/70 | HR 82 | Temp 98.3°F | Ht 65.0 in | Wt 191.0 lb

## 2024-06-16 DIAGNOSIS — E782 Mixed hyperlipidemia: Secondary | ICD-10-CM

## 2024-06-16 DIAGNOSIS — R638 Other symptoms and signs concerning food and fluid intake: Secondary | ICD-10-CM | POA: Diagnosis not present

## 2024-06-16 DIAGNOSIS — R7303 Prediabetes: Secondary | ICD-10-CM | POA: Diagnosis not present

## 2024-06-16 DIAGNOSIS — Z6839 Body mass index (BMI) 39.0-39.9, adult: Secondary | ICD-10-CM

## 2024-06-16 DIAGNOSIS — E66812 Obesity, class 2: Secondary | ICD-10-CM

## 2024-06-16 MED ORDER — TIRZEPATIDE-WEIGHT MANAGEMENT 5 MG/0.5ML ~~LOC~~ SOAJ: 5.0000 mg | SUBCUTANEOUS | 1 refills | Status: DC

## 2024-06-16 NOTE — Assessment & Plan Note (Signed)
 Improved on Zepbound.  She had increased orexigenic signaling, impaired satiety and inhibitory control. This is secondary to an abnormal energy regulation system and pathological neurohormonal pathways characteristic of excess adiposity.  In addition to nutritional and behavioral strategies she benefits from ongoing pharmacotherapy.  She will continue Zepbound at current dose

## 2024-06-16 NOTE — Progress Notes (Signed)
 Office: [phone]  /  Fax: [phone]  Weight Summary and Body Composition Analysis (BIA)  Vitals Temp: 98.3 F (36.8 C) BP: 100/70 Pulse Rate: 82 SpO2: 97 %   Anthropometric Measurements Height: 5' 5 (1.651 m) Weight: 191 lb (86.6 kg) BMI (Calculated): 31.78 Weight at Last Visit: 194 lb Weight Lost Since Last Visit: 3 lb Weight Gained Since Last Visit: 0 lb Starting Weight: 239 lb Total Weight Loss (lbs): 48 lb (21.8 kg) Peak Weight: 242 lb   Body Composition  Body Fat %: 35.3 % Fat Mass (lbs): 67.4 lbs Muscle Mass (lbs): 117.6 lbs Total Body Water (lbs): 77.4 lbs Visceral Fat Rating : 8    RMR: 1858  Today's Visit #: 13  Starting Date: 06/12/23   Subjective   Chief Complaint: Obesity  Interval History Discussed the use of AI scribe software for clinical note transcription with the patient, who gave verbal consent to proceed.  History of Present Illness   Stacey Santiago is a 43 year old female with prediabetes who presents for medical weight management.  Stacey Santiago is currently taking Zepbound  at a dose of 5 mg and has lost 3 pounds since Stacey Santiago last visit. Stacey Santiago adheres to a 1200 calorie nutrition plan, tracking Stacey Santiago intake 85% of the time, and focuses on consuming whole foods. Stacey Santiago ensures adequate protein intake and hydration, and does not skip meals. Stacey Santiago exercise routine includes 3 to 4 sessions per week, each lasting about 30 minutes.  Stacey Santiago has experienced some muscle loss, which has affected Stacey Santiago ability to increase Stacey Santiago GOP1. Stacey Santiago body composition records indicate a decrease in body fat percentage from 43% to 35% since May, and a reduction in visceral fat from 12 to 8. Stacey Santiago incorporates weight lifting into Stacey Santiago routine, using free weights and body weight exercises like squats and lunges.  Stacey Santiago has been focusing on increasing Stacey Santiago protein intake with foods like cottage cheese, yogurt, and protein shakes. Stacey Santiago notes a change in taste preferences, finding sugary drinks  overly sweet, and has reduced Stacey Santiago soda intake, opting for water instead.       Challenges affecting patient progress: multiple competing priorities.    Pharmacotherapy for weight management: Stacey Santiago is currently taking Zepbound  with adequate clinical response  and without side effects..   Assessment and Plan   Treatment Plan For Obesity:  Recommended Dietary Goals  Stacey Santiago is currently in the action stage of change. As such, Stacey Santiago goal is to continue weight management plan. Stacey Santiago has agreed to: continue current plan  Behavioral Health and Counseling  We discussed the following behavioral modification strategies today: continue to work on maintaining a reduced calorie state, getting the recommended amount of protein, incorporating whole foods, making healthy choices, staying well hydrated and practicing mindfulness when eating..  Additional education and resources provided today: None  Recommended Physical Activity Goals  Stacey Santiago has been advised to work up to 150 minutes of moderate intensity aerobic activity a week and strengthening exercises 2-3 times per week for cardiovascular health, weight loss maintenance and preservation of muscle mass.   Stacey Santiago has agreed to :  continue to gradually increase the amount and intensity of exercise routine  Medical Interventions and Pharmacotherapy  We discussed various medication options to help Stacey Santiago with Stacey Santiago weight loss efforts and we both agreed to : Adequate clinical response to anti-obesity medication, continue current regimen and do not recommend further increases in GLP-1 due to adequate clinical response   Associated Conditions Impacted by Obesity Treatment  Assessment &  Plan Class 2 obesity without serious comorbidity with body mass index (BMI) of 39.0 to 39.9 in adult, unspecified obesity type Obesity with significant improvement in body composition. Body fat percentage decreased from 43% to 35%. Visceral fat reduced from 12 to 8. Total weight  loss of 22% from peak weight of 242 lbs. Muscle mass decreased from 127 lbs to 117 lbs, with 20% of weight loss from muscle. Stacey Santiago is on Zepbound  5 mg and following a 1200 calorie nutrition plan with regular exercise. Concerns about muscle loss with potential increase in Zepbound  dosage. Current dose of Zepbound  is effective, surpassing typical outcomes with lifestyle changes and medication at this dose.  - Continue Zepbound  5 mg - Encourage adherence to 1200 calorie nutrition plan - Maintain regular exercise regimen, including weight lifting and body weight exercises - Focus on protein intake, aiming for 30-40 grams per meal - Consider whey isolate protein powder to supplement protein intake - Monitor body composition and muscle mass - Reassess need for Zepbound  dosage increase in future visits Prediabetes Most recent A1c is  Lab Results  Component Value Date   HGBA1C 5.0 02/12/2024   HGBA1C 5.1 05/16/2017    And improved.  Continue current weight management strategy supported by Zepbound  for pharmacoprophylaxis.    Abnormal food appetite Improved on Zepbound .  Stacey Santiago had increased orexigenic signaling, impaired satiety and inhibitory control. This is secondary to an abnormal energy regulation system and pathological neurohormonal pathways characteristic of excess adiposity.  In addition to nutritional and behavioral strategies Stacey Santiago benefits from ongoing pharmacotherapy.  Stacey Santiago will continue Zepbound  at current dose  Mixed hyperlipidemia Reviewed most recent lipid panel.  Stacey Santiago LDL has decreased from 140 to 109 with dietary changes.  Stacey Santiago cardiovascular risk is low Stacey Santiago does not warrant statin therapy.  Losing 10% of body weight may improve condition also reducing saturated fats to less than 10% of daily calories.           Objective   Physical Exam:  Blood pressure 100/70, pulse 82, temperature 98.3 F (36.8 C), height 5' 5 (1.651 m), weight 191 lb (86.6 kg), last menstrual period  05/29/2024, SpO2 97%. Body mass index is 31.78 kg/m.  General: Stacey Santiago is overweight, cooperative, alert, well developed, and in no acute distress. PSYCH: Has normal mood, affect and thought process.   HEENT: EOMI, sclerae are anicteric. Lungs: Normal breathing effort, no conversational dyspnea. Extremities: No edema.  Neurologic: No gross sensory or motor deficits. No tremors or fasciculations noted.    Diagnostic Data Reviewed:  BMET    Component Value Date/Time   NA 139 05/01/2023 0901   NA 141 06/30/2021 1036   K 4.1 05/01/2023 0901   CL 105 05/01/2023 0901   CO2 23 05/01/2023 0901   GLUCOSE 89 05/01/2023 0901   BUN 10 05/01/2023 0901   BUN 9 06/30/2021 1036   CREATININE 0.94 05/01/2023 0901   CALCIUM 9.1 05/01/2023 0901   GFRNONAA 76 10/20/2020 0937   GFRAA 87 10/20/2020 0937   Lab Results  Component Value Date   HGBA1C 5.0 02/12/2024   HGBA1C 5.1 05/16/2017   Lab Results  Component Value Date   INSULIN  12.3 02/12/2024   INSULIN  14.8 06/12/2023   Lab Results  Component Value Date   TSH 1.460 06/12/2023   CBC    Component Value Date/Time   WBC 6.5 05/01/2023 0901   RBC 4.14 05/01/2023 0901   HGB 12.0 05/01/2023 0901   HGB 12.3 06/30/2021 1036   HCT 36.6 05/01/2023  0901   HCT 36.7 06/30/2021 1036   PLT 360.0 05/01/2023 0901   PLT 334 06/30/2021 1036   MCV 88.4 05/01/2023 0901   MCV 87 06/30/2021 1036   MCH 29.1 06/30/2021 1036   MCH 31.4 06/10/2016 0601   MCHC 32.8 05/01/2023 0901   RDW 14.8 05/01/2023 0901   RDW 13.2 06/30/2021 1036   Iron Studies    Component Value Date/Time   IRON 70 02/12/2024 1116   TIBC 435 02/12/2024 1116   FERRITIN 28 02/12/2024 1116   IRONPCTSAT 16 02/12/2024 1116   Lipid Panel     Component Value Date/Time   CHOL 192 05/01/2023 0901   CHOL 227 (H) 06/30/2021 1036   TRIG 91.0 05/01/2023 0901   HDL 68.10 05/01/2023 0901   HDL 69 06/30/2021 1036   CHOLHDL 3 05/01/2023 0901   VLDL 18.2 05/01/2023 0901   LDLCALC 106  (H) 05/01/2023 0901   LDLCALC 143 (H) 06/30/2021 1036   Hepatic Function Panel     Component Value Date/Time   PROT 6.7 04/21/2022 0815   PROT 6.9 06/30/2021 1036   ALBUMIN 3.9 04/21/2022 0815   ALBUMIN 4.3 06/30/2021 1036   AST 13 04/21/2022 0815   ALT 10 04/21/2022 0815   ALKPHOS 62 04/21/2022 0815   BILITOT 0.3 04/21/2022 0815   BILITOT 0.2 06/30/2021 1036      Component Value Date/Time   TSH 1.460 06/12/2023 0931   Nutritional Lab Results  Component Value Date   VD25OH 40.23 05/01/2023   VD25OH 45.21 10/23/2022   VD25OH 26.28 (L) 04/21/2022    Medications: Outpatient Encounter Medications as of 06/16/2024  Medication Sig   APRI  0.15-30 MG-MCG tablet TAKE 1 TABLET BY MOUTH EVERY DAY   cyanocobalamin (VITAMIN B12) 1000 MCG tablet Take 1 tablet (1,000 mcg total) by mouth every other day.   escitalopram  (LEXAPRO ) 10 MG tablet Take 1 tablet (10 mg total) by mouth daily. Please schedule office visit before any future refill.   Multiple Vitamins-Minerals (MULTIVITAMIN WITH MINERALS) tablet Take 1 tablet by mouth daily.   [DISCONTINUED] tirzepatide  (ZEPBOUND ) 5 MG/0.5ML Pen Inject 5 mg into the skin once a week.   tirzepatide  (ZEPBOUND ) 5 MG/0.5ML Pen Inject 5 mg into the skin once a week.   No facility-administered encounter medications on file as of 06/16/2024.     Follow-Up   Return in about 4 weeks (around 07/14/2024) for For Weight Mangement with Dr. Francyne.SABRA Stacey Santiago was informed of the importance of frequent follow up visits to maximize Stacey Santiago success with intensive lifestyle modifications for Stacey Santiago multiple health conditions.  Attestation Statement   Reviewed by clinician on day of visit: allergies, medications, problem list, medical history, surgical history, family history, social history, and previous encounter notes.     Lucas Francyne, MD

## 2024-06-16 NOTE — Assessment & Plan Note (Signed)
 Most recent A1c is  Lab Results  Component Value Date   HGBA1C 5.0 02/12/2024   HGBA1C 5.1 05/16/2017    And improved.  Continue current weight management strategy supported by Zepbound  for pharmacoprophylaxis.

## 2024-06-16 NOTE — Assessment & Plan Note (Signed)
Reviewed most recent lipid panel.  Her LDL has decreased from 140 to 109 with dietary changes.  Her cardiovascular risk is low she does not warrant statin therapy.  Losing 10% of body weight may improve condition also reducing saturated fats to less than 10% of daily calories.

## 2024-06-16 NOTE — Assessment & Plan Note (Signed)
 Obesity with significant improvement in body composition. Body fat percentage decreased from 43% to 35%. Visceral fat reduced from 12 to 8. Total weight loss of 22% from peak weight of 242 lbs. Muscle mass decreased from 127 lbs to 117 lbs, with 20% of weight loss from muscle. She is on Zepbound  5 mg and following a 1200 calorie nutrition plan with regular exercise. Concerns about muscle loss with potential increase in Zepbound  dosage. Current dose of Zepbound  is effective, surpassing typical outcomes with lifestyle changes and medication at this dose.  - Continue Zepbound  5 mg - Encourage adherence to 1200 calorie nutrition plan - Maintain regular exercise regimen, including weight lifting and body weight exercises - Focus on protein intake, aiming for 30-40 grams per meal - Consider whey isolate protein powder to supplement protein intake - Monitor body composition and muscle mass - Reassess need for Zepbound  dosage increase in future visits

## 2024-06-30 ENCOUNTER — Telehealth (INDEPENDENT_AMBULATORY_CARE_PROVIDER_SITE_OTHER): Payer: Self-pay

## 2024-06-30 NOTE — Telephone Encounter (Signed)
 Message from Plan:  Your PA request has been approved. Additional information will be provided in the approval communication. (Message 1145).   Authorization Expiration Date: May 06, 2025.

## 2024-07-11 ENCOUNTER — Encounter (INDEPENDENT_AMBULATORY_CARE_PROVIDER_SITE_OTHER): Payer: Self-pay | Admitting: Internal Medicine

## 2024-07-14 ENCOUNTER — Encounter (INDEPENDENT_AMBULATORY_CARE_PROVIDER_SITE_OTHER): Payer: Self-pay

## 2024-07-15 ENCOUNTER — Telehealth (INDEPENDENT_AMBULATORY_CARE_PROVIDER_SITE_OTHER): Payer: Self-pay

## 2024-07-15 ENCOUNTER — Ambulatory Visit (INDEPENDENT_AMBULATORY_CARE_PROVIDER_SITE_OTHER): Admitting: Internal Medicine

## 2024-07-15 ENCOUNTER — Encounter (INDEPENDENT_AMBULATORY_CARE_PROVIDER_SITE_OTHER): Payer: Self-pay | Admitting: Internal Medicine

## 2024-07-15 VITALS — BP 105/80 | HR 75 | Temp 98.1°F | Ht 65.0 in | Wt 185.0 lb

## 2024-07-15 DIAGNOSIS — R638 Other symptoms and signs concerning food and fluid intake: Secondary | ICD-10-CM

## 2024-07-15 DIAGNOSIS — E782 Mixed hyperlipidemia: Secondary | ICD-10-CM | POA: Diagnosis not present

## 2024-07-15 DIAGNOSIS — E66812 Obesity, class 2: Secondary | ICD-10-CM | POA: Diagnosis not present

## 2024-07-15 DIAGNOSIS — R7303 Prediabetes: Secondary | ICD-10-CM

## 2024-07-15 DIAGNOSIS — Z6839 Body mass index (BMI) 39.0-39.9, adult: Secondary | ICD-10-CM

## 2024-07-15 NOTE — Progress Notes (Signed)
 Office: [phone]  /  Fax: [phone]  Weight Summary and Body Composition Analysis (BIA)  Vitals Temp: 98.1 F (36.7 C) BP: 105/80 Pulse Rate: 75 SpO2: 98 %   Anthropometric Measurements Height: 5' 5 (1.651 m) Weight: 185 lb (83.9 kg) BMI (Calculated): 30.79 Weight at Last Visit: 191 lb Weight Lost Since Last Visit: 6 lb Weight Gained Since Last Visit: 0 lb Starting Weight: 239 lb Total Weight Loss (lbs): 54 lb (24.5 kg) Peak Weight: 242 lb   Body Composition  Body Fat %: 33.6 % Fat Mass (lbs): 62.4 lbs Muscle Mass (lbs): 117 lbs Total Body Water (lbs): 78.6 lbs Visceral Fat Rating : 7    RMR: 1858  Today's Visit #: 14  Starting Date: 06/12/23   Subjective   Chief Complaint: Obesity  Interval History Discussed the use of AI scribe software for clinical note transcription with the patient, who gave verbal consent to proceed.  History of Present Illness   Stacey Santiago is a 43 year old female who presents for medical weight management.  She has lost six pounds since her last office visit, adhering to a 1200 calorie nutrition plan approximately 80% of the time, focusing on whole foods, adequate protein intake, and hydration. She does not skip meals and reports adequate sleep. She exercises three to five days a week for about 30 minutes, engaging in walking and weightlifting.  Her peak weight was 242 pounds, and she started the program at 239 pounds. She is currently on Zepbound  5 mg but is experiencing issues with insurance coverage for the medication. She has not missed a dose, but her last dose was this past Sunday. She has been trying to get a refill, but there have been delays due to insurance requiring prior authorization. She received a letter stating she could continue using Zepbound , but the pharmacy indicates that insurance still needs authorization. She mentions that her insurance might switch her to [facility], which she notes has more nausea as a side  effect compared to Zepbound .  She has lost 24% of her body weight, approximately 57 pounds, since starting the medication. Her body fat percentage has decreased from 43% to 33%, and her visceral fat has reduced from 12 to 7. She has gone down two clothing sizes and had to purchase new clothes.  She travels frequently for work, including trips to [place], [place], and [place], as she works for [employer]. 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       Challenges affecting patient progress: having difficulties with GLP-1 or AOM coverage.    Pharmacotherapy for weight management: She is currently taking Zepbound  with adequate clinical response  and without side effects..   Assessment and Plan   Treatment Plan For Obesity:  Recommended Dietary Goals  [name] is currently in the action stage of change. As such, her goal is to continue weight management plan. She has agreed to: continue current plan  Behavioral Health and Counseling  We discussed the following behavioral modification strategies today: continue to work on maintaining a reduced calorie state, getting the recommended amount of protein, incorporating whole foods, making healthy choices, staying well hydrated and practicing mindfulness when eating..  Additional education and resources provided today: None  Recommended Physical Activity Goals  Gabriella has been advised to work up to 150 minutes of moderate intensity aerobic activity a week and strengthening exercises 2-3 times per week for cardiovascular health, weight loss maintenance and preservation  of muscle mass.   She has agreed to :  continue to gradually increase the amount and intensity of exercise routine  Medical Interventions and Pharmacotherapy  We discussed various medication options to help [name] with her weight loss efforts and we both agreed to : Adequate clinical response to anti-obesity medication,  continue current regimen.   Associated Conditions Impacted by Obesity Treatment  Assessment & Plan Class 2 obesity without serious comorbidity with body mass index (BMI) of 39.0 to 39.9 in adult, unspecified obesity type Obesity is being managed with Zepbound , resulting in a 24% weight loss from peak weight, approximately 57 pounds.  With improvements in subcutaneous fat and visceral adiposity.  Her prediabetes is in remission, blood pressure is well-controlled, and overall cardiometabolic health has improved. Insurance issues with Zepbound  coverage; potential switch to [facility] if Zepbound  is not covered. Zepbound  is more effective and has less nausea due to dual medication composition. Georjean may cause more nausea and requires careful dosing transition. She is following a 1200 calorie nutrition plan, exercising regularly, and has improved body composition with decreased body fat percentage and visceral fat. - Contact insurance to resolve Zepbound  coverage issues. - If Zepbound  is not covered, transition to [facility] starting at 1 mg for a month, then increase to 1.7 mg. - Continue current nutrition and exercise regimen. - Monitor body composition and weight loss progress.  Mixed hyperlipidemia Reviewed most recent lipid panel.  Her LDL has decreased from 140 to 109 with dietary changes.  Her cardiovascular risk is low she does not warrant statin therapy.  Losing 10% of body weight may improve condition also reducing saturated fats to less than 10% of daily calories. Prediabetes Most recent A1c is  Lab Results  Component Value Date   HGBA1C 5.0 02/12/2024   HGBA1C 5.1 05/16/2017    And improved.  Continue current weight management strategy supported by Zepbound  for pharmacoprophylaxis.    Abnormal food appetite Improved on Zepbound .  She had increased orexigenic signaling, impaired satiety and inhibitory control. This is secondary to an abnormal energy regulation system and pathological  neurohormonal pathways characteristic of excess adiposity.  In addition to nutritional and behavioral strategies she benefits from ongoing pharmacotherapy.  She will continue Zepbound  at current dose            Objective   Physical Exam:  Blood pressure 105/80, pulse 75, temperature 98.1 F (36.7 C), height 5' 5 (1.651 m), weight 185 lb (83.9 kg), last menstrual period 05/29/2024, SpO2 98%. Body mass index is 30.79 kg/m.  General: She is overweight, cooperative, alert, well developed, and in no acute distress. PSYCH: Has normal mood, affect and thought process.   HEENT: EOMI, sclerae are anicteric. Lungs: Normal breathing effort, no conversational dyspnea. Extremities: No edema.  Neurologic: No gross sensory or motor deficits. No tremors or fasciculations noted.    Diagnostic Data Reviewed:  BMET    Component Value Date/Time   NA 139 05/01/2023 0901   NA 141 06/30/2021 1036   K 4.1 05/01/2023 0901   CL 105 05/01/2023 0901   CO2 23 05/01/2023 0901   GLUCOSE 89 05/01/2023 0901   BUN 10 05/01/2023 0901   BUN 9 06/30/2021 1036   CREATININE 0.94 05/01/2023 0901   CALCIUM 9.1 05/01/2023 0901   GFRNONAA 76 10/20/2020 0937   GFRAA 87 10/20/2020 0937   Lab Results  Component Value Date   HGBA1C 5.0 02/12/2024   HGBA1C 5.1 05/16/2017   Lab Results  Component Value Date  INSULIN  12.3 02/12/2024   INSULIN  14.8 06/12/2023   Lab Results  Component Value Date   TSH 1.460 06/12/2023   CBC    Component Value Date/Time   WBC 6.5 05/01/2023 0901   RBC 4.14 05/01/2023 0901   HGB 12.0 05/01/2023 0901   HGB 12.3 06/30/2021 1036   HCT 36.6 05/01/2023 0901   HCT 36.7 06/30/2021 1036   PLT 360.0 05/01/2023 0901   PLT 334 06/30/2021 1036   MCV 88.4 05/01/2023 0901   MCV 87 06/30/2021 1036   MCH 29.1 06/30/2021 1036   MCH 31.4 06/10/2016 0601   MCHC 32.8 05/01/2023 0901   RDW 14.8 05/01/2023 0901   RDW 13.2 06/30/2021 1036   Iron Studies    Component Value  Date/Time   IRON 70 02/12/2024 1116   TIBC 435 02/12/2024 1116   FERRITIN 28 02/12/2024 1116   IRONPCTSAT 16 02/12/2024 1116   Lipid Panel     Component Value Date/Time   CHOL 192 05/01/2023 0901   CHOL 227 (H) 06/30/2021 1036   TRIG 91.0 05/01/2023 0901   HDL 68.10 05/01/2023 0901   HDL 69 06/30/2021 1036   CHOLHDL 3 05/01/2023 0901   VLDL 18.2 05/01/2023 0901   LDLCALC 106 (H) 05/01/2023 0901   LDLCALC 143 (H) 06/30/2021 1036   Hepatic Function Panel     Component Value Date/Time   PROT 6.7 04/21/2022 0815   PROT 6.9 06/30/2021 1036   ALBUMIN 3.9 04/21/2022 0815   ALBUMIN 4.3 06/30/2021 1036   AST 13 04/21/2022 0815   ALT 10 04/21/2022 0815   ALKPHOS 62 04/21/2022 0815   BILITOT 0.3 04/21/2022 0815   BILITOT 0.2 06/30/2021 1036      Component Value Date/Time   TSH 1.460 06/12/2023 0931   Nutritional Lab Results  Component Value Date   VD25OH 40.23 05/01/2023   VD25OH 45.21 10/23/2022   VD25OH 26.28 (L) 04/21/2022    Medications: Outpatient Encounter Medications as of 07/15/2024  Medication Sig   APRI  0.15-30 MG-MCG tablet TAKE 1 TABLET BY MOUTH EVERY DAY   cyanocobalamin (VITAMIN B12) 1000 MCG tablet Take 1 tablet (1,000 mcg total) by mouth every other day.   escitalopram  (LEXAPRO ) 10 MG tablet Take 1 tablet (10 mg total) by mouth daily. Please schedule office visit before any future refill.   Multiple Vitamins-Minerals (MULTIVITAMIN WITH MINERALS) tablet Take 1 tablet by mouth daily.   tirzepatide  (ZEPBOUND ) 5 MG/0.5ML Pen Inject 5 mg into the skin once a week.   No facility-administered encounter medications on file as of 07/15/2024.     Follow-Up   Return in about 4 weeks (around 08/12/2024) for For Weight Mangement with Dr. Francyne.SABRA She was informed of the importance of frequent follow up visits to maximize her success with intensive lifestyle modifications for her multiple health conditions.  Attestation Statement   Reviewed by clinician on day of  visit: allergies, medications, problem list, medical history, surgical history, family history, social history, and previous encounter notes.     Lucas Francyne, MD

## 2024-07-15 NOTE — Telephone Encounter (Signed)
 PA for Zepbound  started, was approved 06/30/24, but no longer and looks like wegovy is the preferred.

## 2024-07-15 NOTE — Assessment & Plan Note (Signed)
 Improved on Zepbound.  She had increased orexigenic signaling, impaired satiety and inhibitory control. This is secondary to an abnormal energy regulation system and pathological neurohormonal pathways characteristic of excess adiposity.  In addition to nutritional and behavioral strategies she benefits from ongoing pharmacotherapy.  She will continue Zepbound at current dose

## 2024-07-15 NOTE — Assessment & Plan Note (Signed)
 Obesity is being managed with Zepbound , resulting in a 24% weight loss from peak weight, approximately 57 pounds.  With improvements in subcutaneous fat and visceral adiposity.  Her prediabetes is in remission, blood pressure is well-controlled, and overall cardiometabolic health has improved. Insurance issues with Zepbound  coverage; potential switch to [facility] if Zepbound  is not covered. Zepbound  is more effective and has less nausea due to dual medication composition. Stacey Santiago may cause more nausea and requires careful dosing transition. She is following a 1200 calorie nutrition plan, exercising regularly, and has improved body composition with decreased body fat percentage and visceral fat. - Contact insurance to resolve Zepbound  coverage issues. - If Zepbound  is not covered, transition to [facility] starting at 1 mg for a month, then increase to 1.7 mg. - Continue current nutrition and exercise regimen. - Monitor body composition and weight loss progress.

## 2024-07-15 NOTE — Assessment & Plan Note (Signed)
Reviewed most recent lipid panel.  Her LDL has decreased from 140 to 109 with dietary changes.  Her cardiovascular risk is low she does not warrant statin therapy.  Losing 10% of body weight may improve condition also reducing saturated fats to less than 10% of daily calories.

## 2024-07-15 NOTE — Assessment & Plan Note (Signed)
 Most recent A1c is  Lab Results  Component Value Date   HGBA1C 5.0 02/12/2024   HGBA1C 5.1 05/16/2017    And improved.  Continue current weight management strategy supported by Zepbound  for pharmacoprophylaxis.

## 2024-07-16 ENCOUNTER — Encounter (INDEPENDENT_AMBULATORY_CARE_PROVIDER_SITE_OTHER): Payer: Self-pay

## 2024-07-16 NOTE — Telephone Encounter (Signed)
Wegovy approved, pt notified

## 2024-07-21 ENCOUNTER — Other Ambulatory Visit (INDEPENDENT_AMBULATORY_CARE_PROVIDER_SITE_OTHER): Payer: Self-pay

## 2024-07-21 ENCOUNTER — Other Ambulatory Visit (INDEPENDENT_AMBULATORY_CARE_PROVIDER_SITE_OTHER): Payer: Self-pay | Admitting: Internal Medicine

## 2024-07-21 ENCOUNTER — Telehealth (INDEPENDENT_AMBULATORY_CARE_PROVIDER_SITE_OTHER): Payer: Self-pay | Admitting: Internal Medicine

## 2024-07-21 MED ORDER — WEGOVY 0.25 MG/0.5ML ~~LOC~~ SOAJ: 0.2500 mg | SUBCUTANEOUS | 0 refills | Status: DC

## 2024-07-21 MED ORDER — SEMAGLUTIDE(0.25 OR 0.5MG/DOS) 2 MG/1.5ML ~~LOC~~ SOPN: 0.2500 mg | PEN_INJECTOR | SUBCUTANEOUS | 0 refills | Status: DC

## 2024-07-21 NOTE — Telephone Encounter (Signed)
 Patient needs a RX for Wegovy  due to Zepbound  no longer covered by pt's insurance. Pt wants a call back from Dr. Francyne or Olam asap. Pt has missed last injection of Zepbound , and wants RX for Wegovy  asap. Please call pt at [phone]. [facility].

## 2024-08-06 ENCOUNTER — Ambulatory Visit: Payer: Self-pay

## 2024-08-06 ENCOUNTER — Ambulatory Visit
Admission: RE | Admit: 2024-08-06 | Discharge: 2024-08-06 | Disposition: A | Discharge status: Home / Self Care | Attending: Emergency Medicine | Admitting: Emergency Medicine

## 2024-08-06 VITALS — BP 102/75 | HR 81 | Temp 98.4°F | Resp 16 | Ht 65.0 in | Wt 188.0 lb

## 2024-08-06 DIAGNOSIS — L03213 Periorbital cellulitis: Secondary | ICD-10-CM | POA: Diagnosis not present

## 2024-08-06 MED ORDER — AMOXICILLIN-POT CLAVULANATE 875-125 MG PO TABS: 1.0000 | ORAL_TABLET | Freq: Two times a day (BID) | ORAL | 0 refills | Status: DC

## 2024-08-06 NOTE — ED Provider Notes (Signed)
 CAY RALPH PELT    CSN: [id number] Arrival date & time: 08/06/24  1022      History   Chief Complaint Chief Complaint  Patient presents with   Rash    Eye swollen, bumps - Entered by patient    HPI Stacey Santiago is a 43 y.o. female.  Patient presents with 1 day history of swelling and redness of right upper and lower eyelids.  She also reports a bump on her right upper eyelid and under her lower lip x 1 day.  No trauma.  She denies eye pain, vision change, eye drainage, fever, chills, ear pain, sore throat, cough, shortness of breath.  No OTC medications taken.  The history is provided by the patient and medical records.    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 of age    OB History     Gravida  2   Para  2   Term  2   Preterm      AB      Living  2      SAB      IAB      Ectopic      Multiple       Live Births  2            Home Medications    Prior to Admission medications   Medication Sig Start Date End Date Taking? Authorizing Provider  amoxicillin -clavulanate (AUGMENTIN ) 875-125 MG tablet Take 1 tablet by mouth every 12 (twelve) hours. 08/06/24  Yes Corlis Burnard DEL, NP  APRI  0.15-30 MG-MCG tablet TAKE 1 TABLET BY MOUTH EVERY DAY 06/11/24   Dugal, Tabitha, FNP  cyanocobalamin (VITAMIN B12) 1000 MCG tablet Take 1 tablet (1,000 mcg total) by mouth every other day. 06/26/23   Francyne Romano, MD  escitalopram  (LEXAPRO ) 10 MG tablet Take 1 tablet (10 mg total) by mouth daily. Please schedule office visit before any future refill. 05/01/23   Corwin Antu, FNP  Multiple Vitamins-Minerals (MULTIVITAMIN WITH MINERALS) tablet Take 1 tablet by mouth daily. 05/06/24   Francyne Romano, MD  semaglutide -weight management (WEGOVY ) 0.25 MG/0.5ML SOAJ SQ injection Inject 0.25 mg into the skin once a week. 07/21/24   Francyne Romano, MD    Family History Family History  Problem Relation Age of Onset   Diabetes Mother    Hypertension Mother  Hyperlipidemia Mother    Heart disease Mother    Obesity Mother    Hypertension Father    Hyperlipidemia Father    Heart attack Maternal Grandmother        25   Diabetes Maternal Grandfather    Heart attack Maternal Grandfather        29's   Breast cancer Neg Hx     Social History Social History   Tobacco Use   Smoking status: Never   Smokeless tobacco: Never  Vaping Use   Vaping status: Never Used  Substance Use Topics   Alcohol use: Yes    Comment: social   Drug use: No     Allergies   Patient has no known allergies.   Review of Systems Review of Systems  Constitutional:  Negative for chills and fever.  HENT:  Negative for ear pain, mouth sores, sore throat, trouble swallowing and voice change.   Eyes:  Positive for redness. Negative for pain, discharge, itching and visual disturbance.  Respiratory:  Negative for cough  and shortness of breath.      Physical Exam Triage Vital Signs ED Triage Vitals  Encounter Vitals Group     BP 08/06/24 1059 102/75     Girls Systolic BP Percentile --      Girls Diastolic BP Percentile --      Boys Systolic BP Percentile --      Boys Diastolic BP Percentile --      Pulse Rate 08/06/24 1059 81     Resp 08/06/24 1059 16     Temp 08/06/24 1059 98.4 F (36.9 C)     Temp Source 08/06/24 1059 Oral     SpO2 08/06/24 1059 100 %     Weight 08/06/24 1056 188 lb (85.3 kg)     Height 08/06/24 1056 5' 5 (1.651 m)     Head Circumference --      Peak Flow --      Pain Score --      Pain Loc --      Pain Education --      Exclude from Growth Chart --    No data found.  Updated Vital Signs BP 102/75 (BP Location: Left Arm)   Pulse 81   Temp 98.4 F (36.9 C) (Oral)   Resp 16   Ht 5' 5 (1.651 m)   Wt 188 lb (85.3 kg)   LMP 07/24/2024   SpO2 100%   BMI 31.28 kg/m   Visual Acuity Right Eye Distance:   Left Eye Distance:   Bilateral Distance:    Right Eye Near: R Near: 20/25 Left Eye Near:  L Near: 20/20 Bilateral Near:  20/20  Physical Exam Constitutional:      General: She is not in acute distress. HENT:     Right Ear: Tympanic membrane normal.     Left Ear: Tympanic membrane normal.     Nose: Nose normal.     Mouth/Throat:     Mouth: Mucous membranes are moist.     Pharynx: Oropharynx is clear.  Eyes:     General: Vision grossly intact.        Right eye: No discharge.        Left eye: No discharge.     Extraocular Movements: Extraocular movements intact.     Conjunctiva/sclera: Conjunctivae normal.     Pupils: Pupils are equal, round, and reactive to light.     Comments: Mild erythema and moderate edema of right upper and  lower eyelids.   Cardiovascular:     Rate and Rhythm: Normal rate and regular rhythm.     Heart sounds: Normal heart sounds.  Pulmonary:     Effort: Pulmonary effort is normal. No respiratory distress.     Breath sounds:  Normal breath sounds.  Skin:    General: Skin is warm and dry.     Findings: No rash.  Neurological:     Mental Status: She is alert.      UC Treatments / Results  Labs (all labs ordered are listed, but only abnormal results are displayed) Labs Reviewed - No data to display  EKG   Radiology No results found.  Procedures Procedures (including critical care time)  Medications Ordered in UC Medications - No data to display  Initial Impression / Assessment and Plan / UC Course  I have reviewed the triage vital signs and the nursing notes.  Pertinent labs & imaging results that were available during my care of the patient were reviewed by me and considered in my medical decision making (see chart for details).    Preseptal cellulitis of right eye.  Afebrile and vital signs are stable.  Treating with Augmentin .  Education provided on preseptal cellulitis.  ED precautions discussed.  Instructed patient to follow-up with her PCP tomorrow.  She agrees to plan of care.  Final Clinical Impressions(s) / UC Diagnoses   Final diagnoses:  Preseptal cellulitis of right eye     Discharge Instructions      Take the Augmentin  as directed.    Follow up with your primary care provider tomorrow.  Go to the emergency department if you have worsening symptoms.        ED Prescriptions     Medication Sig Dispense Auth. Provider   amoxicillin -clavulanate (AUGMENTIN ) 875-125 MG tablet Take 1 tablet by mouth every 12 (twelve) hours. 14 tablet Corlis Burnard DEL, NP      PDMP not reviewed this encounter.   Corlis Burnard DEL, NP 08/06/24 1122

## 2024-08-06 NOTE — ED Triage Notes (Addendum)
 Patient in office today complaint of bilateral rash,swelling on eyes and possible bottom lip area x 1d Ni vision change   OTC: none  Denies: n/v

## 2024-08-06 NOTE — Discharge Instructions (Addendum)
Take the Augmentin as directed.  Follow up with your primary care provider tomorrow.  Go to the emergency department if you have worsening symptoms.

## 2024-08-08 ENCOUNTER — Encounter: Payer: Self-pay | Admitting: Family

## 2024-08-08 ENCOUNTER — Ambulatory Visit: Admitting: Family

## 2024-08-08 VITALS — BP 122/70 | HR 67 | Temp 98.8°F | Ht 65.0 in | Wt 187.6 lb

## 2024-08-08 DIAGNOSIS — T783XXA Angioneurotic edema, initial encounter: Secondary | ICD-10-CM | POA: Diagnosis not present

## 2024-08-08 DIAGNOSIS — Z1231 Encounter for screening mammogram for malignant neoplasm of breast: Secondary | ICD-10-CM | POA: Diagnosis not present

## 2024-08-08 DIAGNOSIS — L239 Allergic contact dermatitis, unspecified cause: Secondary | ICD-10-CM | POA: Diagnosis not present

## 2024-08-08 MED ORDER — MUPIROCIN 2 % EX OINT: 1.0000 | TOPICAL_OINTMENT | Freq: Two times a day (BID) | CUTANEOUS | 0 refills | Status: DC

## 2024-08-08 MED ORDER — PREDNISONE 20 MG PO TABS: ORAL_TABLET | ORAL | 0 refills | Status: DC

## 2024-08-08 MED ORDER — HYDROXYZINE HCL 10 MG PO TABS: ORAL_TABLET | ORAL | 0 refills | Status: DC

## 2024-08-08 MED ORDER — DESONIDE 0.05 % EX CREA: TOPICAL_CREAM | Freq: Two times a day (BID) | CUTANEOUS | 0 refills | Status: DC

## 2024-08-08 MED ORDER — EPINEPHRINE 0.3 MG/0.3ML IJ SOAJ: 0.3000 mg | INTRAMUSCULAR | 1 refills | Status: AC | PRN

## 2024-08-08 NOTE — Progress Notes (Signed)
 Established Patient Office Visit  Subjective:      CC:  Chief Complaint  Patient presents with   Follow-up    Urgent care follow up    HPI: Stacey Santiago is a 43 y.o. female presenting on 08/08/2024 for Follow-up (Urgent care follow up) .  Discussed the use of AI scribe software for clinical note transcription with the patient, who gave verbal consent to proceed.  History of Present Illness Stacey Santiago is a 43 year old female who presents with a rash and swelling around the eyes and face.  The patient experienced the onset of white bumps around her eye area on Tuesday, followed by swelling of the eye on Wednesday morning. She visited urgent care on Wednesday and was prescribed Augmentin  (amoxicillin ) for suspected preseptal cellulitis.  Since starting Augmentin , the rash has spread to her nose, shoulders, and back, appearing as red, blister-like rashes resembling poison oak. The rash is itchy, and the swelling has not improved with antibiotics. She has been using Zyrtec, which has helped with the itching.  No eye pain, changes in vision, or significant discharge from the eyes, although she notes some normal morning discharge. No throat tightness, breathing difficulties, fever, cough, or recent illness, although her son has been coughing.  Her current medications include Wegovy , which she started after her insurance stopped covering Zepbound . She has taken three shots of Wegovy  so far. She is also on birth control and has not changed any other medications recently. She mentions using a new face serum containing hyaluronic acid and collagen for the past three weeks but has stopped using it since the rash appeared.  She denies any known allergies to medications and has not introduced any new vitamins or personal care products recently, except for the face serum. She works from home and has a son who attends [employer].         Social history:  Relevant past medical,  surgical, family and social history reviewed and updated as indicated. Interim medical history since our last visit reviewed.  Allergies and medications reviewed and updated.  DATA REVIEWED: CHART IN EPIC     ROS: Negative unless specifically indicated above in HPI.    Current Outpatient Medications:    APRI  0.15-30 MG-MCG tablet, TAKE 1 TABLET BY MOUTH EVERY DAY, Disp: 84 tablet, Rfl: 0   cyanocobalamin (VITAMIN B12) 1000 MCG tablet, Take 1 tablet (1,000 mcg total) by mouth every other day., Disp: , Rfl:    desonide  (DESOWEN ) 0.05 % cream, Apply topically 2 (two) times daily., Disp: 30 g, Rfl: 0   EPINEPHrine  0.3 mg/0.3 mL IJ SOAJ injection, Inject 0.3 mg into the muscle as needed for anaphylaxis., Disp: 1 each, Rfl: 1   escitalopram  (LEXAPRO ) 10 MG tablet, Take 1 tablet (10 mg total) by mouth daily. Please schedule office visit before any future refill., Disp: 90 tablet, Rfl: 3   hydrOXYzine  (ATARAX ) 10 MG tablet, 1/2 to one tablet po at bedtime prn itching, Disp: 30 tablet, Rfl: 0   Multiple Vitamins-Minerals (MULTIVITAMIN WITH MINERALS) tablet, Take 1 tablet by mouth daily., Disp: , Rfl:    mupirocin  ointment (BACTROBAN ) 2 %, Apply 1 Application topically 2 (two) times daily., Disp: 22 g, Rfl: 0   predniSONE  (DELTASONE ) 20 MG tablet, Take two tablets po qd for five days, one tablet po qd for five days, then 1/2 tablet po qd for five days, Disp: 18 tablet, Rfl: 0   semaglutide -weight management (WEGOVY ) 0.25 MG/0.5ML SOAJ SQ injection,  Inject 0.25 mg into the skin once a week., Disp: 2 mL, Rfl: 0        Objective:        BP 122/70 (BP Location: Left Arm, Patient Position: Sitting, Cuff Size: Normal)   Pulse 67   Temp 98.8 F (37.1 C) (Temporal)   Ht 5' 5 (1.651 m)   Wt 187 lb 9.6 oz (85.1 kg)   LMP 07/24/2024   SpO2 98%   BMI 31.22 kg/m   Physical Exam HEENT: Mild swelling of the upper lip under the nose. SKIN: Vesicular rash on arm, welts on face, blisters on left  arm, spreading spots on back.  Wt Readings from Last 3 Encounters:  08/08/24 187 lb 9.6 oz (85.1 kg)  08/06/24 188 lb (85.3 kg)  07/15/24 185 lb (83.9 kg)    Physical Exam Vitals reviewed.  Constitutional:      General: She is not in acute distress.    Appearance: Normal appearance. She is normal weight. She is not ill-appearing, toxic-appearing or diaphoretic.  HENT:     Head: Normocephalic. Right periorbital erythema and left periorbital erythema present.     Nose: Nasal deformity (swelling) present.  Cardiovascular:     Rate and Rhythm: Normal rate and regular rhythm.  Pulmonary:     Effort: Pulmonary effort is normal.     Breath sounds: Normal breath sounds.  Musculoskeletal:        General: Normal range of motion.  Skin:    Findings: Rash present. Rash is urticarial (left anterior neck with urticarial welt) and vesicular (multiple scattered vesicular lesions on upper forehead, left lateral upper arm, and a few spots on the back).     Comments: Left upper lip and left external nares with yellow dried discharge  Neurological:     General: No focal deficit present.     Mental Status: She is alert and oriented to person, place, and time. Mental status is at baseline.  Psychiatric:        Mood and Affect: Mood normal.        Behavior: Behavior normal.        Thought Content: Thought content normal.        Judgment: Judgment normal.                Results   Assessment & Plan:   Assessment and Plan. Assessment & Plan Allergic reaction with cutaneous involvement (angioedema and allergic contact dermatitis) Presents with angioedema and allergic contact dermatitis, characterized by swelling around the eyes, nose, and welts on the body. Possible triggers include Augmentin  or a new face serum with hyaluronic acid and collagen. Differential diagnosis includes poison ivy exposure and potential impetigo due to vesicular rash and localized infection. - Discontinue  Augmentin . - Prescribe prednisone  taper for 15 days to reduce inflammation and swelling. - Prescribe hydroxyzine  for itching, starting with half a tablet to assess drowsiness. - Provide an EpiPen  for emergency use with instructions to read the packet and call 911 if used. - Prescribe desonide  cream for welts, avoiding use near the eyes.  Impetigo (localized staph infection) Suspected localized staph infection around the nose and mouth, possibly secondary to scratching. Presents with yellow crusting, indicative of impetigo. - Prescribe mupirocin  ointment for application to infected areas twice daily for 10 days. - Advise application of mupirocin  inside the nostrils using a Q-tip to address potential nasal staph colonization.  Recording duration: 20 minutes  Wt Readings from Last 3 Encounters:  08/08/24 187 lb  9.6 oz (85.1 kg)  08/06/24 188 lb (85.3 kg)  07/15/24 185 lb (83.9 kg)       Return if symptoms worsen or fail to improve.     Ginger Patrick, MSN, APRN, FNP-C [facility]

## 2024-08-18 ENCOUNTER — Ambulatory Visit (INDEPENDENT_AMBULATORY_CARE_PROVIDER_SITE_OTHER): Admitting: Internal Medicine

## 2024-08-18 ENCOUNTER — Encounter (INDEPENDENT_AMBULATORY_CARE_PROVIDER_SITE_OTHER): Payer: Self-pay | Admitting: Internal Medicine

## 2024-08-18 VITALS — BP 114/78 | HR 82 | Temp 98.1°F | Ht 65.0 in | Wt 186.0 lb

## 2024-08-18 DIAGNOSIS — Z6830 Body mass index (BMI) 30.0-30.9, adult: Secondary | ICD-10-CM

## 2024-08-18 DIAGNOSIS — R638 Other symptoms and signs concerning food and fluid intake: Secondary | ICD-10-CM

## 2024-08-18 DIAGNOSIS — E669 Obesity, unspecified: Secondary | ICD-10-CM | POA: Diagnosis not present

## 2024-08-18 DIAGNOSIS — R7303 Prediabetes: Secondary | ICD-10-CM

## 2024-08-18 DIAGNOSIS — E782 Mixed hyperlipidemia: Secondary | ICD-10-CM | POA: Diagnosis not present

## 2024-08-18 NOTE — Progress Notes (Unsigned)
 Office: [phone]  /  Fax: [phone]  Weight Summary and Body Composition Analysis (BIA)  Vitals Temp: 98.1 F (36.7 C) BP: 114/78 Pulse Rate: 82 SpO2: 97 %   Anthropometric Measurements Height: 5' 5 (1.651 m) Weight: 186 lb (84.4 kg) BMI (Calculated): 30.95 Weight at Last Visit: 185 lb Weight Lost Since Last Visit: 0 lb Weight Gained Since Last Visit: 1 lb Starting Weight: 239 lb Total Weight Loss (lbs): 53 lb (24 kg) Peak Weight: 242 lb   Body Composition  Body Fat %: 37.5 % Fat Mass (lbs): 70 lbs Muscle Mass (lbs): 110.8 lbs Total Body Water (lbs): 76.6 lbs Visceral Fat Rating : 8    RMR: 1858  Today's Visit #: 15  Starting Date: 06/12/23   Subjective   Chief Complaint: Obesity  Interval History Discussed the use of AI scribe software for clinical note transcription with the patient, who gave verbal consent to proceed.  History of Present Illness Stacey Santiago is a 43 year old female with prediabetes and hyperlipidemia who presents for medical weight management.  She has gained one pound since her last visit, despite adhering to a 1200 calorie diet and exercising three to five days a week for 20 to 60 minutes, including walking and strengthening exercises. Over the past year and three months, the patient reports significant weight loss.  She is currently on Wegovy , having completed her first month on a starter dose of 0.25 mg. She experienced facial swelling after two doses, initially treated as a skin infection with amoxicillin  and prednisone . It was later suspected to be an allergic reaction to amoxicillin , which is now listed as an allergy. No nausea with Wegovy .  She has been on Zepbound  previously.  She notes an increase in hunger, stating she can finish her plate and needs to consciously save food for leftovers. Despite this, she has maintained her weight with only a one-pound gain.     Challenges affecting patient progress: strong hunger  signals and/or impaired satiety / inhibitory control and having difficulties with GLP-1 or AOM coverage.    Pharmacotherapy for weight management: She is currently taking Wegovy  with adequate clinical response  and without side effects..   Assessment and Plan   Treatment Plan For Obesity:  Recommended Dietary Goals  [name] is currently in the action stage of change. As such, her goal is to continue weight management plan. She has agreed to: continue current plan  Behavioral Health and Counseling  We discussed the following behavioral modification strategies today: continue to work on maintaining a reduced calorie state, getting the recommended amount of protein, incorporating whole foods, making healthy choices, staying well hydrated and practicing mindfulness when eating. and increase protein intake, fibrous foods (25 grams per day for women, 30 grams for men) and water to improve satiety and decrease hunger signals. .  Additional education and resources provided today: None  Recommended Physical Activity Goals  [name] has been advised to work up to 150 minutes of moderate intensity aerobic activity a week and strengthening exercises 2-3 times per week for cardiovascular health, weight loss maintenance and preservation of muscle mass.  She has agreed to :  Think about enjoyable ways to increase daily physical activity and overcoming barriers to exercise, Increase physical activity in their day and reduce sedentary time (increase NEAT)., Increase volume of physical activity to a goal of 240 minutes a week, and Combine aerobic and strengthening exercises for efficiency and improved cardiometabolic health.  Medical Interventions and Pharmacotherapy  We  discussed various medication options to help Stacey Santiago with her weight loss efforts and we both agreed to : Increase Wegovy  to 0.5 mg once a week  Associated Conditions Impacted by Obesity Treatment  Assessment & Plan Prediabetes Currently  on Wegovy  for pharmacoprophylaxis without any adverse effects continue GLP-1 along with nutritional behavioral strategies for diabetes prevention. Abnormal food appetite BMI 30.0-30.9,adult Generalized obesity -with a starting BMI of 40 Weight: decrease of 56.8 lb (23.4%) over 1 year, 3 months  Start: 05/01/2023 242 lb 12.8 oz (110.1 kg)  End: 08/18/2024 186 lb (84.4 kg)   Obesity with abnormal appetite regulation Obesity with abnormal appetite regulation, currently managed with Wegovy . She has maintained weight with only a 1-pound gain despite being on a lower dose of Wegovy . Reports increased hunger but has adapted eating habits to manage portion sizes. Discussed genetic predisposition to increased gastric emptying, potentially contributing to appetite regulation issues. No nausea reported with Wegovy .  Previously had been on Zepbound  continue with up titration of medication. - Increase Wegovy  to 0.5 mg weekly - Implement dietary plan inspired by [facility] to delay gastric emptying, including pre-meal protein snacks - Encourage 240 minutes of exercise per week, combining cardio and strengthening exercises - Discuss potential to taper off GLP-1 medications once goal weight is achieved Mixed hyperlipidemia She will have fasting lipid profile at her upcoming physical we will follow-up on test results.         Objective   Physical Exam:  Blood pressure 114/78, pulse 82, temperature 98.1 F (36.7 C), height 5' 5 (1.651 m), weight 186 lb (84.4 kg), last menstrual period 07/24/2024, SpO2 97%. Body mass index is 30.95 kg/m.  General: She is overweight, cooperative, alert, well developed, and in no acute distress. PSYCH: Has normal mood, affect and thought process.   HEENT: EOMI, sclerae are anicteric. Lungs: Normal breathing effort, no conversational dyspnea. Extremities: No edema.  Neurologic: No gross sensory or motor deficits. No tremors or fasciculations noted.    Diagnostic Data  Reviewed:  BMET    Component Value Date/Time   NA 139 05/01/2023 0901   NA 141 06/30/2021 1036   K 4.1 05/01/2023 0901   CL 105 05/01/2023 0901   CO2 23 05/01/2023 0901   GLUCOSE 89 05/01/2023 0901   BUN 10 05/01/2023 0901   BUN 9 06/30/2021 1036   CREATININE 0.94 05/01/2023 0901   CALCIUM 9.1 05/01/2023 0901   GFRNONAA 76 10/20/2020 0937   GFRAA 87 10/20/2020 0937   Lab Results  Component Value Date   HGBA1C 5.0 02/12/2024   HGBA1C 5.1 05/16/2017   Lab Results  Component Value Date   INSULIN  12.3 02/12/2024   INSULIN  14.8 06/12/2023   Lab Results  Component Value Date   TSH 1.460 06/12/2023   CBC    Component Value Date/Time   WBC 6.5 05/01/2023 0901   RBC 4.14 05/01/2023 0901   HGB 12.0 05/01/2023 0901   HGB 12.3 06/30/2021 1036   HCT 36.6 05/01/2023 0901   HCT 36.7 06/30/2021 1036   PLT 360.0 05/01/2023 0901   PLT 334 06/30/2021 1036   MCV 88.4 05/01/2023 0901   MCV 87 06/30/2021 1036   MCH 29.1 06/30/2021 1036   MCH 31.4 06/10/2016 0601   MCHC 32.8 05/01/2023 0901   RDW 14.8 05/01/2023 0901   RDW 13.2 06/30/2021 1036   Iron Studies    Component Value Date/Time   IRON 70 02/12/2024 1116   TIBC 435 02/12/2024 1116   FERRITIN 28 02/12/2024  1116   IRONPCTSAT 16 02/12/2024 1116   Lipid Panel     Component Value Date/Time   CHOL 192 05/01/2023 0901   CHOL 227 (H) 06/30/2021 1036   TRIG 91.0 05/01/2023 0901   HDL 68.10 05/01/2023 0901   HDL 69 06/30/2021 1036   CHOLHDL 3 05/01/2023 0901   VLDL 18.2 05/01/2023 0901   LDLCALC 106 (H) 05/01/2023 0901   LDLCALC 143 (H) 06/30/2021 1036   Hepatic Function Panel     Component Value Date/Time   PROT 6.7 04/21/2022 0815   PROT 6.9 06/30/2021 1036   ALBUMIN 3.9 04/21/2022 0815   ALBUMIN 4.3 06/30/2021 1036   AST 13 04/21/2022 0815   ALT 10 04/21/2022 0815   ALKPHOS 62 04/21/2022 0815   BILITOT 0.3 04/21/2022 0815   BILITOT 0.2 06/30/2021 1036      Component Value Date/Time   TSH 1.460  06/12/2023 0931   Nutritional Lab Results  Component Value Date   VD25OH 40.23 05/01/2023   VD25OH 45.21 10/23/2022   VD25OH 26.28 (L) 04/21/2022    Medications: Outpatient Encounter Medications as of 08/18/2024  Medication Sig   APRI  0.15-30 MG-MCG tablet TAKE 1 TABLET BY MOUTH EVERY DAY   cyanocobalamin (VITAMIN B12) 1000 MCG tablet Take 1 tablet (1,000 mcg total) by mouth every other day.   desonide  (DESOWEN ) 0.05 % cream Apply topically 2 (two) times daily.   EPINEPHrine  0.3 mg/0.3 mL IJ SOAJ injection Inject 0.3 mg into the muscle as needed for anaphylaxis.   escitalopram  (LEXAPRO ) 10 MG tablet Take 1 tablet (10 mg total) by mouth daily. Please schedule office visit before any future refill.   hydrOXYzine  (ATARAX ) 10 MG tablet 1/2 to one tablet po at bedtime prn itching   Multiple Vitamins-Minerals (MULTIVITAMIN WITH MINERALS) tablet Take 1 tablet by mouth daily.   mupirocin  ointment (BACTROBAN ) 2 % Apply 1 Application topically 2 (two) times daily.   predniSONE  (DELTASONE ) 20 MG tablet Take two tablets po qd for five days, one tablet po qd for five days, then 1/2 tablet po qd for five days   semaglutide -weight management (WEGOVY ) 0.25 MG/0.5ML SOAJ SQ injection Inject 0.25 mg into the skin once a week.   No facility-administered encounter medications on file as of 08/18/2024.     Follow-Up   No follow-ups on file.SABRA She was informed of the importance of frequent follow up visits to maximize her success with intensive lifestyle modifications for her multiple health conditions.  Attestation Statement   Reviewed by clinician on day of visit: allergies, medications, problem list, medical history, surgical history, family history, social history, and previous encounter notes.     Lucas Parker, MD

## 2024-08-19 DIAGNOSIS — E669 Obesity, unspecified: Secondary | ICD-10-CM | POA: Insufficient documentation

## 2024-08-19 DIAGNOSIS — Z6830 Body mass index (BMI) 30.0-30.9, adult: Secondary | ICD-10-CM | POA: Insufficient documentation

## 2024-08-19 MED ORDER — WEGOVY 0.5 MG/0.5ML ~~LOC~~ SOAJ: 0.5000 mg | SUBCUTANEOUS | 0 refills | Status: DC

## 2024-08-19 NOTE — Assessment & Plan Note (Signed)
 She will have fasting lipid profile at her upcoming physical we will follow-up on test results.

## 2024-08-19 NOTE — Assessment & Plan Note (Signed)
 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 She has maintained weight with only a 1-pound gain despite being on a lower dose of Wegovy . Reports increased hunger but has adapted eating habits to manage portion sizes. Discussed genetic predisposition to increased gastric emptying, potentially contributing to appetite regulation issues. No nausea reported with Wegovy .  Previously had been on Zepbound  continue with up titration of medication. - Increase Wegovy  to 0.5 mg weekly - Implement dietary plan inspired by [facility] to delay gastric emptying, including pre-meal protein snacks - Encourage 240 minutes of exercise per week, combining cardio and strengthening exercises - Discuss potential to taper off GLP-1 medications once goal weight is achieved

## 2024-08-19 NOTE — Assessment & Plan Note (Signed)
 Currently on Wegovy  for pharmacoprophylaxis without any adverse effects continue GLP-1 along with nutritional behavioral strategies for diabetes prevention.

## 2024-08-30 ENCOUNTER — Other Ambulatory Visit: Payer: Self-pay | Admitting: Family

## 2024-08-30 DIAGNOSIS — Z3009 Encounter for other general counseling and advice on contraception: Secondary | ICD-10-CM

## 2024-08-30 DIAGNOSIS — L239 Allergic contact dermatitis, unspecified cause: Secondary | ICD-10-CM

## 2024-08-30 DIAGNOSIS — T783XXA Angioneurotic edema, initial encounter: Secondary | ICD-10-CM

## 2024-09-12 ENCOUNTER — Encounter: Payer: Self-pay | Admitting: Family

## 2024-09-12 ENCOUNTER — Ambulatory Visit: Payer: Self-pay | Admitting: Family

## 2024-09-12 VITALS — BP 116/76 | HR 71 | Temp 98.2°F | Ht 65.0 in | Wt 184.4 lb

## 2024-09-12 DIAGNOSIS — E559 Vitamin D deficiency, unspecified: Secondary | ICD-10-CM

## 2024-09-12 DIAGNOSIS — R7303 Prediabetes: Secondary | ICD-10-CM

## 2024-09-12 DIAGNOSIS — Z3009 Encounter for other general counseling and advice on contraception: Secondary | ICD-10-CM

## 2024-09-12 DIAGNOSIS — Z0000 Encounter for general adult medical examination without abnormal findings: Secondary | ICD-10-CM

## 2024-09-12 DIAGNOSIS — E782 Mixed hyperlipidemia: Secondary | ICD-10-CM

## 2024-09-12 DIAGNOSIS — F411 Generalized anxiety disorder: Secondary | ICD-10-CM

## 2024-09-12 DIAGNOSIS — E538 Deficiency of other specified B group vitamins: Secondary | ICD-10-CM | POA: Diagnosis not present

## 2024-09-12 DIAGNOSIS — Z23 Encounter for immunization: Secondary | ICD-10-CM

## 2024-09-12 DIAGNOSIS — D500 Iron deficiency anemia secondary to blood loss (chronic): Secondary | ICD-10-CM

## 2024-09-12 LAB — BASIC METABOLIC PANEL WITH GFR
BUN: 11 mg/dL (ref 6–23)
CO2: 24 meq/L (ref 19–32)
Calcium: 9.7 mg/dL (ref 8.4–10.5)
Chloride: 107 meq/L (ref 96–112)
Creatinine, Ser: 1 mg/dL (ref 0.40–1.20)
GFR: 69.04 mL/min (ref >60.00)
Glucose, Bld: 80 mg/dL (ref 70–99)
Potassium: 4.8 meq/L (ref 3.5–5.1)
Sodium: 141 meq/L (ref 135–145)

## 2024-09-12 LAB — LIPID PANEL
Cholesterol: 224 mg/dL — ABNORMAL HIGH (ref 0–200)
HDL: 67.8 mg/dL (ref >39.00)
LDL Cholesterol: 134 mg/dL — ABNORMAL HIGH (ref 0–99)
NonHDL: 156.23
Total CHOL/HDL Ratio: 3
Triglycerides: 110 mg/dL (ref 0.0–149.0)
VLDL: 22 mg/dL (ref 0.0–40.0)

## 2024-09-12 LAB — TSH: TSH: 1.41 u[IU]/mL (ref 0.35–5.50)

## 2024-09-12 LAB — VITAMIN B12: Vitamin B-12: 532 pg/mL (ref 211–911)

## 2024-09-12 LAB — CBC
HCT: 37.2 % (ref 36.0–46.0)
Hemoglobin: 12 g/dL (ref 12.0–15.0)
MCHC: 32.4 g/dL (ref 30.0–36.0)
MCV: 83.7 fl (ref 78.0–100.0)
Platelets: 428 K/uL — ABNORMAL HIGH (ref 150.0–400.0)
RBC: 4.44 Mil/uL (ref 3.87–5.11)
RDW: 16 % — ABNORMAL HIGH (ref 11.5–15.5)
WBC: 6.8 K/uL (ref 4.0–10.5)

## 2024-09-12 LAB — HEMOGLOBIN A1C: Hgb A1c MFr Bld: 5.6 % (ref 4.6–6.5)

## 2024-09-12 LAB — VITAMIN D 25 HYDROXY (VIT D DEFICIENCY, FRACTURES): VITD: 36.47 ng/mL (ref 30.00–100.00)

## 2024-09-12 MED ORDER — ESCITALOPRAM OXALATE 10 MG PO TABS
10.0000 mg | ORAL_TABLET | Freq: Every day | ORAL | 3 refills | Status: AC
Start: 2024-09-12 — End: ?

## 2024-09-12 MED ORDER — APRI 0.15-30 MG-MCG PO TABS: 1.0000 | ORAL_TABLET | Freq: Every day | ORAL | 3 refills | Status: AC

## 2024-09-12 NOTE — Progress Notes (Signed)
 Subjective:  Patient ID: Stacey Santiago, female    DOB: 1981/11/07  Age: 43 y.o. MRN: [id number]  Patient Care Team: Corwin Antu, FNP as PCP - General (Family Medicine)   CC:  Chief Complaint  Patient presents with   Annual Exam    HPI Stacey Santiago is a 43 y.o. female who presents today for an annual physical exam. She reports consuming a general diet. The patient does not participate in regular exercise at present. She generally feels well. She reports sleeping well. She does not have additional problems to discuss today.   Vision:Within last year Dental:Receives regular dental care  Mammogram: 09/27/23 has scheduled  Last pap: 10/20/20 every five years   Pt is without acute concerns.   Advanced Directives Patient does not have advanced directives   DEPRESSION SCREENING    09/12/2024    9:58 AM 06/06/2023    2:03 PM 05/01/2023    8:26 AM 10/23/2022    8:35 AM 06/30/2021    9:35 AM 10/20/2020    8:58 AM 09/13/2020    8:48 AM  PHQ 2/9 Scores  PHQ - 2 Score 0 0 0 0 1 0 0  PHQ- 9 Score 0    2       ROS: Negative unless specifically indicated above in HPI.    Current Outpatient Medications:    APRI  0.15-30 MG-MCG tablet, TAKE 1 TABLET BY MOUTH EVERY DAY, Disp: 84 tablet, Rfl: 0   cyanocobalamin (VITAMIN B12) 1000 MCG tablet, Take 1 tablet (1,000 mcg total) by mouth every other day., Disp: , Rfl:    EPINEPHrine  0.3 mg/0.3 mL IJ SOAJ injection, Inject 0.3 mg into the muscle as needed for anaphylaxis., Disp: 1 each, Rfl: 1   escitalopram  (LEXAPRO ) 10 MG tablet, Take 1 tablet (10 mg total) by mouth daily. Please schedule office visit before any future refill., Disp: 90 tablet, Rfl: 3   Multiple Vitamins-Minerals (MULTIVITAMIN WITH MINERALS) tablet, Take 1 tablet by mouth daily., Disp: , Rfl:    semaglutide -weight management (WEGOVY ) 0.5 MG/0.5ML SOAJ SQ injection, Inject 0.5 mg into the skin once a week., Disp: 2 mL, Rfl: 0    Objective:    BP 116/76 (BP Location: Left Arm,  Patient Position: Sitting, Cuff Size: Normal)   Pulse 71   Temp 98.2 F (36.8 C) (Temporal)   Ht 5' 5 (1.651 m)   Wt 184 lb 6.4 oz (83.6 kg)   SpO2 100%   BMI 30.69 kg/m   BP Readings from Last 3 Encounters:  09/12/24 116/76  08/18/24 114/78  08/08/24 122/70      Physical Exam Vitals reviewed.  Constitutional:      General: She is not in acute distress.    Appearance: Normal appearance. She is obese. She is not ill-appearing.  HENT:     Head: Normocephalic.     Right Ear: Tympanic membrane normal.     Left Ear: Tympanic membrane normal.     Nose: Nose normal.     Mouth/Throat:     Mouth: Mucous membranes are moist.  Eyes:     Extraocular Movements: Extraocular movements intact.     Pupils: Pupils are equal, round, and reactive to light.  Cardiovascular:     Rate and Rhythm: Normal rate and regular rhythm.  Pulmonary:     Effort: Pulmonary effort is normal.     Breath sounds: Normal breath sounds.  Abdominal:     General: Abdomen is flat. Bowel sounds are normal.  Palpations: Abdomen is soft.     Tenderness: There is no guarding or rebound.  Musculoskeletal:        General: Normal range of motion.     Cervical back: Normal range of motion.  Skin:    General: Skin is warm.     Capillary Refill: Capillary refill takes less than 2 seconds.  Neurological:     General: No focal deficit present.     Mental Status: She is alert.  Psychiatric:        Mood and Affect: Mood normal.        Behavior: Behavior normal.        Thought Content: Thought content normal.        Judgment: Judgment normal.       Results       Assessment & Plan:   Assessment and Plan Assessment & Plan General Health Maintenance She is generally well with no significant issues. She is up to date with eye and dental exams, attempts to exercise, and reports good sleep. Her A1c was last checked in April, and vitamin D  and B12 levels have improved with supplementation. She is on  semaglutide  (Wegovy ) for weight management after discontinuing Zepbound  due to insurance issues. Wegovy  works on two receptors, promoting satiety, but she is not yet at the optimal dose for full effects. She lacks an advanced directive or living will but was advised to provide one if obtained. - Administer tetanus vaccine today. - Review records for HPV vaccination status and administer if needed. - Order A1c test to monitor glucose control. - Monitor vitamin D  and B12 levels periodically. - Continue semaglutide  (Wegovy ) for weight management.     Patient Counseling(The following topics were reviewed):  Preventative care handout given to pt  Health maintenance and immunizations reviewed. Please refer to Health maintenance section. Pt advised on safe sex, wearing seatbelts in car, and proper nutrition labwork ordered today for annual Dental health: Discussed importance of regular tooth brushing, flossing, and dental visits.     Follow-up: Return in about 1 year (around 09/12/2025) for f/u CPE.   Stacey Patrick, FNP

## 2024-09-16 ENCOUNTER — Ambulatory Visit (INDEPENDENT_AMBULATORY_CARE_PROVIDER_SITE_OTHER): Admitting: Internal Medicine

## 2024-09-16 ENCOUNTER — Encounter (INDEPENDENT_AMBULATORY_CARE_PROVIDER_SITE_OTHER): Payer: Self-pay | Admitting: Internal Medicine

## 2024-09-16 VITALS — BP 116/80 | HR 74 | Temp 98.4°F | Ht 65.0 in | Wt 181.0 lb

## 2024-09-16 DIAGNOSIS — E669 Obesity, unspecified: Secondary | ICD-10-CM | POA: Diagnosis not present

## 2024-09-16 DIAGNOSIS — E782 Mixed hyperlipidemia: Secondary | ICD-10-CM | POA: Diagnosis not present

## 2024-09-16 DIAGNOSIS — R7303 Prediabetes: Secondary | ICD-10-CM

## 2024-09-16 DIAGNOSIS — Z6830 Body mass index (BMI) 30.0-30.9, adult: Secondary | ICD-10-CM | POA: Diagnosis not present

## 2024-09-16 MED ORDER — WEGOVY 1 MG/0.5ML ~~LOC~~ SOAJ: 1.0000 mg | SUBCUTANEOUS | 0 refills | Status: DC

## 2024-09-16 NOTE — Assessment & Plan Note (Signed)
 Most recent A1c is  Lab Results  Component Value Date   HGBA1C 5.6 09/12/2024   HGBA1C 5.1 05/16/2017   A1c is at goal at 5.6.  She is currently on Wegovy  for pharmacoprophylaxis and will continue current weight management strategy inclusive of GLP-1.

## 2024-09-16 NOTE — Assessment & Plan Note (Signed)
 Weight: decrease of 61.8 lb (25.5%) over 1 year, 4 months  Start: 05/01/2023 242 lb 12.8 oz (110.1 kg)  End: 09/16/2024 181 lb (82.1 kg)  Continue 1200-calorie nutrition plan Work on increasing protein intake to 120 g/day and increasing fiber to 25 g Work on increasing volume of physical activity to 240 minutes a week

## 2024-09-16 NOTE — Assessment & Plan Note (Signed)
 LDL is not at goal. Elevated LDL may be secondary to nutrition, genetics and spillover effect from excess adiposity. Recommended LDL goal is <70 to reduce the risk of fatty streaks and the progression to obstructive ASCVD in the future.   Her 10 year risk is: The 10-year ASCVD risk score (Arnett DK, et al., 2019) is: 0.5%  Lab Results  Component Value Date   CHOL 224 (H) 09/12/2024   HDL 67.80 09/12/2024   LDLCALC 134 (H) 09/12/2024   TRIG 110.0 09/12/2024   CHOLHDL 3 09/12/2024    Continue weight loss therapy, losing 10% or more of body weight may improve condition. Also advised to reduce saturated fats in diet to less than 10% of daily calories.  We discussed reading food labels and avoiding foods that have more than 10% of saturated fats.  Considering age and cardiovascular risk she does not warrant statin therapy.  Further risk stratification may be completed by primary care team.

## 2024-09-16 NOTE — Progress Notes (Deleted)
 Office: [phone]  /  Fax: [phone]  Weight Summary and Body Composition Analysis (BIA)  Vitals Temp: 98.4 F (36.9 C) BP: 116/80 Pulse Rate: 74 SpO2: 99 %   Anthropometric Measurements Height: 5' 5 (1.651 m) Weight: 181 lb (82.1 kg) BMI (Calculated): 30.12 Weight at Last Visit: 186 lb Weight Lost Since Last Visit: 5lb Weight Gained Since Last Visit: 0 lb Starting Weight: 239 lb Total Weight Loss (lbs): 58 lb (26.3 kg) Peak Weight: 242 lb   Body Composition  Body Fat %: 34.4 % Fat Mass (lbs): 62.4 lbs Muscle Mass (lbs): 112.8 lbs Total Body Water (lbs): 76.2 lbs Visceral Fat Rating : 7   The 10-year ASCVD risk score (Arnett DK, et al., 2019) is: 0.5%  RMR: 1858  Today's Visit #: 16  Starting Date: 06/12/23   Subjective   Chief Complaint: Obesity  Interval History Discussed the use of AI scribe software for clinical note transcription with the patient, who gave verbal consent to proceed.  History of Present Illness      Challenges affecting patient progress: {EMOBESITYBARRIERS:28841::none}.    Pharmacotherapy for weight management: She is currently taking {EMPharmaco:28845}.   Assessment and Plan   Treatment Plan For Obesity:  Recommended Dietary Goals  Stacey Santiago is currently in the action stage of change. As such, her goal is to continue weight management plan. She has agreed to: {EMWTLOSSPLAN:29297::continue current plan}  Behavioral Health and Counseling  We discussed the following behavioral modification strategies today: {EMWMwtlossstrategies:28914::continue to work on maintaining a reduced calorie state, getting the recommended amount of protein, incorporating whole foods, making healthy choices, staying well hydrated and practicing mindfulness when eating.,increase protein intake, fibrous foods (25 grams per day for women, 30 grams for men) and water to improve satiety and decrease hunger signals. }.  Additional education and  resources provided today: {EMadditionalresources:29169::None}  Recommended Physical Activity Goals  Stacey Santiago has been advised to work up to 150 minutes of moderate intensity aerobic activity a week and strengthening exercises 2-3 times per week for cardiovascular health, weight loss maintenance and preservation of muscle mass.  She has agreed to :  {EMEXERCISE:28847::Think about enjoyable ways to increase daily physical activity and overcoming barriers to exercise,Increase physical activity in their day and reduce sedentary time (increase NEAT).,Increase volume of physical activity to a goal of 240 minutes a week,Combine aerobic and strengthening exercises for efficiency and improved cardiometabolic health.}  Medical Interventions and Pharmacotherapy  We discussed various medication options to help Stacey Santiago with her weight loss efforts and we both agreed to : {EMagreedrx:29170}  Associated Conditions Impacted by Obesity Treatment  Assessment & Plan Prediabetes  Obesity (BMI 30-39.9)     Assessment and Plan Assessment & Plan   Weight: decrease of 61.8 lb (25.5%) over 1 year, 4 months  Start: 05/01/2023 242 lb 12.8 oz (110.1 kg)  End: 09/16/2024 181 lb (82.1 kg)      Objective   Physical Exam:  Blood pressure 116/80, pulse 74, temperature 98.4 F (36.9 C), height 5' 5 (1.651 m), weight 181 lb (82.1 kg), SpO2 99%. Body mass index is 30.12 kg/m.  General: She is overweight, cooperative, alert, well developed, and in no acute distress. PSYCH: Has normal mood, affect and thought process.   HEENT: EOMI, sclerae are anicteric. Lungs: Normal breathing effort, no conversational dyspnea. Extremities: No edema.  Neurologic: No gross sensory or motor deficits. No tremors or fasciculations noted.    Diagnostic Data Reviewed:  BMET    Component Value Date/Time   NA  141 09/12/2024 1044   NA 141 06/30/2021 1036   K 4.8 09/12/2024 1044   CL 107 09/12/2024 1044   CO2 24  09/12/2024 1044   GLUCOSE 80 09/12/2024 1044   BUN 11 09/12/2024 1044   BUN 9 06/30/2021 1036   CREATININE 1.00 09/12/2024 1044   CALCIUM 9.7 09/12/2024 1044   GFRNONAA 76 10/20/2020 0937   GFRAA 87 10/20/2020 0937   Lab Results  Component Value Date   HGBA1C 5.6 09/12/2024   HGBA1C 5.1 05/16/2017   Lab Results  Component Value Date   INSULIN  12.3 02/12/2024   INSULIN  14.8 06/12/2023   Lab Results  Component Value Date   TSH 1.41 09/12/2024   CBC    Component Value Date/Time   WBC 6.8 09/12/2024 1044   RBC 4.44 09/12/2024 1044   HGB 12.0 09/12/2024 1044   HGB 12.3 06/30/2021 1036   HCT 37.2 09/12/2024 1044   HCT 36.7 06/30/2021 1036   PLT 428.0 (H) 09/12/2024 1044   PLT 334 06/30/2021 1036   MCV 83.7 09/12/2024 1044   MCV 87 06/30/2021 1036   MCH 29.1 06/30/2021 1036   MCH 31.4 06/10/2016 0601   MCHC 32.4 09/12/2024 1044   RDW 16.0 (H) 09/12/2024 1044   RDW 13.2 06/30/2021 1036   Iron Studies    Component Value Date/Time   IRON 70 02/12/2024 1116   TIBC 435 02/12/2024 1116   FERRITIN 28 02/12/2024 1116   IRONPCTSAT 16 02/12/2024 1116   Lipid Panel     Component Value Date/Time   CHOL 224 (H) 09/12/2024 1044   CHOL 227 (H) 06/30/2021 1036   TRIG 110.0 09/12/2024 1044   HDL 67.80 09/12/2024 1044   HDL 69 06/30/2021 1036   CHOLHDL 3 09/12/2024 1044   VLDL 22.0 09/12/2024 1044   LDLCALC 134 (H) 09/12/2024 1044   LDLCALC 143 (H) 06/30/2021 1036   Hepatic Function Panel     Component Value Date/Time   PROT 6.7 04/21/2022 0815   PROT 6.9 06/30/2021 1036   ALBUMIN 3.9 04/21/2022 0815   ALBUMIN 4.3 06/30/2021 1036   AST 13 04/21/2022 0815   ALT 10 04/21/2022 0815   ALKPHOS 62 04/21/2022 0815   BILITOT 0.3 04/21/2022 0815   BILITOT 0.2 06/30/2021 1036      Component Value Date/Time   TSH 1.41 09/12/2024 1044   Nutritional Lab Results  Component Value Date   VD25OH 36.47 09/12/2024   VD25OH 40.23 05/01/2023   VD25OH 45.21 10/23/2022     Medications: Outpatient Encounter Medications as of 09/16/2024  Medication Sig   cyanocobalamin (VITAMIN B12) 1000 MCG tablet Take 1 tablet (1,000 mcg total) by mouth every other day.   desogestrel -ethinyl estradiol  (APRI ) 0.15-30 MG-MCG tablet Take 1 tablet by mouth daily.   EPINEPHrine  0.3 mg/0.3 mL IJ SOAJ injection Inject 0.3 mg into the muscle as needed for anaphylaxis.   escitalopram  (LEXAPRO ) 10 MG tablet Take 1 tablet (10 mg total) by mouth daily. Please schedule office visit before any future refill.   Multiple Vitamins-Minerals (MULTIVITAMIN WITH MINERALS) tablet Take 1 tablet by mouth daily.   semaglutide -weight management (WEGOVY ) 0.5 MG/0.5ML SOAJ SQ injection Inject 0.5 mg into the skin once a week.   No facility-administered encounter medications on file as of 09/16/2024.     Follow-Up   No follow-ups on file.Stacey Santiago She was informed of the importance of frequent follow up visits to maximize her success with intensive lifestyle modifications for her multiple health conditions.  Attestation Statement  Reviewed by clinician on day of visit: allergies, medications, problem list, medical history, surgical history, family history, social history, and previous encounter notes.     Lucas Parker, MD spent

## 2024-09-16 NOTE — Progress Notes (Signed)
 Office: [phone]  /  Fax: [phone]  Weight Summary And Body Composition Analysis (BIA)  Vitals Temp: 98.4 F (36.9 C) BP: 116/80 Pulse Rate: 74 SpO2: 99 %   Anthropometric Measurements Height: 5' 5 (1.651 m) Weight: 181 lb (82.1 kg) BMI (Calculated): 30.12 Weight at Last Visit: 186 lb Weight Lost Since Last Visit: 5lb Weight Gained Since Last Visit: 0 lb Starting Weight: 239 lb Total Weight Loss (lbs): 58 lb (26.3 kg) Peak Weight: 242 lb   Body Composition  Body Fat %: 34.4 % Fat Mass (lbs): 62.4 lbs Muscle Mass (lbs): 112.8 lbs Total Body Water (lbs): 76.2 lbs Visceral Fat Rating : 7    RMR: 1858  Today's Visit #: 16  Starting Date: 06/12/23   Subjective   Chief Complaint: Obesity  Stacey Santiago is here to discuss her progress with her obesity treatment plan. She is following the Category 2 plan - 1200 kcal per day and states she is following her eating plan approximately 70-80% of the time. She states she is exercising 30 minutes and 60 minutes 4 times per week..  Weight Progress Since Last Visit:  Since last office visit she has lost 5 pounds. She reports fair adherence to reduced calorie nutritional plan. She has been working on reading food labels, not skipping meals, increasing protein intake at every meal, drinking more water, making healthier choices, reducing portion sizes, and incorporating more whole foods   Nutritional 24 HR Recall: Intake consistent with prescribed nutritional plan  Challenges affecting patient progress: none.   Orexigenic Control: Denies problems with appetite and hunger signals.  Denies problems with satiety and satiation.  Denies problems with eating patterns and portion control.  Denies abnormal cravings. Denies feeling deprived or restricted.   Pharmacotherapy for weight management: She is currently taking Wegovy  with adequate clinical response  and experiencing the following side effects: constipation.    Assessment and Plan   Treatment Plan For Obesity:  Recommended Dietary Goals  [name] is currently in the action stage of change. As such, her goal is to continue weight management plan. She has agreed to: continue current plan  Behavioral Health and Counseling  We discussed the following behavioral modification strategies today: continue to work on maintaining a reduced calorie state, getting the recommended amount of protein, incorporating whole foods, making healthy choices, staying well hydrated and practicing mindfulness when eating. and increase protein intake, fibrous foods (25 grams per day for women, 30 grams for men) and water to improve satiety and decrease hunger signals. .  Additional education and resources provided today: None  Recommended Physical Activity Goals  [name] has been advised to work up to 150 minutes of moderate intensity aerobic activity a week and strengthening exercises 2-3 times per week for cardiovascular health, weight loss maintenance and preservation of muscle mass.   She has agreed to :  Increase volume of physical activity to a goal of 240 minutes a week and Combine aerobic and strengthening exercises for efficiency and improved cardiometabolic health.  Pharmacotherapy and Medical Interventions  Increase Wegovy  to 1 mg once a week  Associated Conditions Impacted by Obesity Treatment  Assessment & Plan Prediabetes Most recent A1c is  Lab Results  Component Value Date   HGBA1C 5.6 09/12/2024   HGBA1C 5.1 05/16/2017   A1c is at goal at 5.6.  She is currently on Wegovy  for pharmacoprophylaxis and will continue current weight management strategy inclusive of GLP-1.   Obesity (BMI 30-39.9) Weight: decrease of 61.8 lb (25.5%) over  1 year, 4 months  Start: 05/01/2023 242 lb 12.8 oz (110.1 kg)  End: 09/16/2024 181 lb (82.1 kg)  Continue 1200-calorie nutrition plan Work on increasing protein intake to 120 g/day and increasing fiber to 25 g Work  on increasing volume of physical activity to 240 minutes a week Mixed hyperlipidemia LDL is not at goal. Elevated LDL may be secondary to nutrition, genetics and spillover effect from excess adiposity. Recommended LDL goal is <70 to reduce the risk of fatty streaks and the progression to obstructive ASCVD in the future.   Her 10 year risk is: The 10-year ASCVD risk score (Arnett DK, et al., 2019) is: 0.5%  Lab Results  Component Value Date   CHOL 224 (H) 09/12/2024   HDL 67.80 09/12/2024   LDLCALC 134 (H) 09/12/2024   TRIG 110.0 09/12/2024   CHOLHDL 3 09/12/2024    Continue weight loss therapy, losing 10% or more of body weight may improve condition. Also advised to reduce saturated fats in diet to less than 10% of daily calories.  We discussed reading food labels and avoiding foods that have more than 10% of saturated fats.  Considering age and cardiovascular risk she does not warrant statin therapy.  Further risk stratification may be completed by primary care team.       Objective   Physical Exam:  Blood pressure 116/80, pulse 74, temperature 98.4 F (36.9 C), height 5' 5 (1.651 m), weight 181 lb (82.1 kg), SpO2 99%. Body mass index is 30.12 kg/m.  General: She is overweight, cooperative, alert, well developed, and in no acute distress. PSYCH: Has normal mood, affect and thought process.   HEENT: EOMI, sclerae are anicteric. Lungs: Normal breathing effort, no conversational dyspnea. Extremities: No edema.  Neurologic: No gross sensory or motor deficits. No tremors or fasciculations noted.    Diagnostic Data Reviewed:  BMET    Component Value Date/Time   NA 141 09/12/2024 1044   NA 141 06/30/2021 1036   K 4.8 09/12/2024 1044   CL 107 09/12/2024 1044   CO2 24 09/12/2024 1044   GLUCOSE 80 09/12/2024 1044   BUN 11 09/12/2024 1044   BUN 9 06/30/2021 1036   CREATININE 1.00 09/12/2024 1044   CALCIUM 9.7 09/12/2024 1044   GFRNONAA 76 10/20/2020 0937   GFRAA 87  10/20/2020 0937   Lab Results  Component Value Date   HGBA1C 5.6 09/12/2024   HGBA1C 5.1 05/16/2017   Lab Results  Component Value Date   INSULIN  12.3 02/12/2024   INSULIN  14.8 06/12/2023   Lab Results  Component Value Date   TSH 1.41 09/12/2024   CBC    Component Value Date/Time   WBC 6.8 09/12/2024 1044   RBC 4.44 09/12/2024 1044   HGB 12.0 09/12/2024 1044   HGB 12.3 06/30/2021 1036   HCT 37.2 09/12/2024 1044   HCT 36.7 06/30/2021 1036   PLT 428.0 (H) 09/12/2024 1044   PLT 334 06/30/2021 1036   MCV 83.7 09/12/2024 1044   MCV 87 06/30/2021 1036   MCH 29.1 06/30/2021 1036   MCH 31.4 06/10/2016 0601   MCHC 32.4 09/12/2024 1044   RDW 16.0 (H) 09/12/2024 1044   RDW 13.2 06/30/2021 1036   Iron Studies    Component Value Date/Time   IRON 70 02/12/2024 1116   TIBC 435 02/12/2024 1116   FERRITIN 28 02/12/2024 1116   IRONPCTSAT 16 02/12/2024 1116   Lipid Panel     Component Value Date/Time   CHOL 224 (H) 09/12/2024 1044  CHOL 227 (H) 06/30/2021 1036   TRIG 110.0 09/12/2024 1044   HDL 67.80 09/12/2024 1044   HDL 69 06/30/2021 1036   CHOLHDL 3 09/12/2024 1044   VLDL 22.0 09/12/2024 1044   LDLCALC 134 (H) 09/12/2024 1044   LDLCALC 143 (H) 06/30/2021 1036   Hepatic Function Panel     Component Value Date/Time   PROT 6.7 04/21/2022 0815   PROT 6.9 06/30/2021 1036   ALBUMIN 3.9 04/21/2022 0815   ALBUMIN 4.3 06/30/2021 1036   AST 13 04/21/2022 0815   ALT 10 04/21/2022 0815   ALKPHOS 62 04/21/2022 0815   BILITOT 0.3 04/21/2022 0815   BILITOT 0.2 06/30/2021 1036      Component Value Date/Time   TSH 1.41 09/12/2024 1044   Nutritional Lab Results  Component Value Date   VD25OH 36.47 09/12/2024   VD25OH 40.23 05/01/2023   VD25OH 45.21 10/23/2022    Medications: Outpatient Encounter Medications as of 09/16/2024  Medication Sig   cyanocobalamin (VITAMIN B12) 1000 MCG tablet Take 1 tablet (1,000 mcg total) by mouth every other day.   desogestrel -ethinyl  estradiol  (APRI ) 0.15-30 MG-MCG tablet Take 1 tablet by mouth daily.   EPINEPHrine  0.3 mg/0.3 mL IJ SOAJ injection Inject 0.3 mg into the muscle as needed for anaphylaxis.   escitalopram  (LEXAPRO ) 10 MG tablet Take 1 tablet (10 mg total) by mouth daily. Please schedule office visit before any future refill.   Multiple Vitamins-Minerals (MULTIVITAMIN WITH MINERALS) tablet Take 1 tablet by mouth daily.   semaglutide -weight management (WEGOVY ) 1 MG/0.5ML SOAJ SQ injection Inject 1 mg into the skin once a week.   [DISCONTINUED] semaglutide -weight management (WEGOVY ) 0.5 MG/0.5ML SOAJ SQ injection Inject 0.5 mg into the skin once a week.   No facility-administered encounter medications on file as of 09/16/2024.     Follow-Up   Return in about 4 weeks (around 10/14/2024) for For Weight Mangement with Dr. Francyne.SABRA She was informed of the importance of frequent follow up visits to maximize her success with intensive lifestyle modifications for her multiple health conditions.  Attestation Statement   Reviewed by clinician on day of visit: allergies, medications, problem list, medical history, surgical history, family history, social history, and previous encounter notes.     Lucas Francyne, MD

## 2024-09-18 ENCOUNTER — Ambulatory Visit: Payer: Self-pay | Admitting: Family

## 2024-09-26 ENCOUNTER — Encounter: Payer: Self-pay | Admitting: Family

## 2024-09-29 ENCOUNTER — Ambulatory Visit
Admission: RE | Admit: 2024-09-29 | Discharge: 2024-09-29 | Disposition: A | Discharge status: Home / Self Care | Source: Ambulatory Visit | Attending: Family | Admitting: Family

## 2024-09-29 DIAGNOSIS — Z1231 Encounter for screening mammogram for malignant neoplasm of breast: Secondary | ICD-10-CM | POA: Diagnosis present

## 2024-10-02 ENCOUNTER — Other Ambulatory Visit: Payer: Self-pay | Admitting: Family

## 2024-10-02 ENCOUNTER — Ambulatory Visit: Payer: Self-pay | Admitting: Family

## 2024-10-02 DIAGNOSIS — R928 Other abnormal and inconclusive findings on diagnostic imaging of breast: Secondary | ICD-10-CM

## 2024-10-08 ENCOUNTER — Ambulatory Visit
Admission: RE | Admit: 2024-10-08 | Discharge: 2024-10-08 | Disposition: A | Discharge status: Home / Self Care | Source: Ambulatory Visit | Attending: Family | Admitting: Family

## 2024-10-08 ENCOUNTER — Ambulatory Visit
Admission: RE | Admit: 2024-10-08 | Discharge: 2024-10-08 | Disposition: A | Source: Ambulatory Visit | Attending: Family

## 2024-10-08 DIAGNOSIS — R928 Other abnormal and inconclusive findings on diagnostic imaging of breast: Secondary | ICD-10-CM | POA: Insufficient documentation

## 2024-10-09 ENCOUNTER — Ambulatory Visit: Payer: Self-pay | Admitting: Family

## 2024-10-09 NOTE — Progress Notes (Signed)
 noted

## 2024-10-14 ENCOUNTER — Encounter (INDEPENDENT_AMBULATORY_CARE_PROVIDER_SITE_OTHER): Payer: Self-pay | Admitting: Internal Medicine

## 2024-10-14 ENCOUNTER — Ambulatory Visit (INDEPENDENT_AMBULATORY_CARE_PROVIDER_SITE_OTHER): Payer: Self-pay | Admitting: Internal Medicine

## 2024-10-14 VITALS — BP 124/82 | HR 89 | Temp 98.4°F | Ht 65.0 in | Wt 177.0 lb

## 2024-10-14 DIAGNOSIS — E782 Mixed hyperlipidemia: Secondary | ICD-10-CM | POA: Diagnosis not present

## 2024-10-14 DIAGNOSIS — E669 Obesity, unspecified: Secondary | ICD-10-CM | POA: Diagnosis not present

## 2024-10-14 DIAGNOSIS — R638 Other symptoms and signs concerning food and fluid intake: Secondary | ICD-10-CM | POA: Diagnosis not present

## 2024-10-14 DIAGNOSIS — Z6829 Body mass index (BMI) 29.0-29.9, adult: Secondary | ICD-10-CM

## 2024-10-14 DIAGNOSIS — R7303 Prediabetes: Secondary | ICD-10-CM

## 2024-10-14 MED ORDER — WEGOVY 1 MG/0.5ML ~~LOC~~ SOAJ: 1.0000 mg | SUBCUTANEOUS | 2 refills | Status: DC

## 2024-10-14 NOTE — Assessment & Plan Note (Signed)
 Improved on Wegovy  She has increased orexigenic signaling, impaired satiety and inhibitory control. This is secondary to an abnormal energy regulation system and pathological neurohormonal pathways characteristic of excess adiposity.  In addition to nutritional and behavioral strategies she benefits from pharmacotherapy GLP-1.

## 2024-10-14 NOTE — Assessment & Plan Note (Signed)
 A1c increased from 5.0 to 5.6, possibly due to medication transition. Cardiovascular risk is low at 0.6%, with no need for cholesterol-lowering medication. Blood pressure is well-controlled at 124/82. Emphasis on dietary management to control carbohydrate intake and maintain a balanced diet. - Will repeat A1c in six months. - Monitor carbohydrate intake, aiming for 40% of calories from carbs. - Incorporate tracking of dietary intake to adjust macronutrient balance. - Continue low-fat dairy and reduce intake of saturated fats.

## 2024-10-14 NOTE — Assessment & Plan Note (Signed)
 LDL is not at goal. Elevated LDL may be secondary to nutrition, genetics and spillover effect from excess adiposity. Recommended LDL goal is <70 to reduce the risk of fatty streaks and the progression to obstructive ASCVD in the future.   Her 10 year risk is: The 10-year ASCVD risk score (Arnett DK, et al., 2019) is: 0.6%  Lab Results  Component Value Date   CHOL 224 (H) 09/12/2024   HDL 67.80 09/12/2024   LDLCALC 134 (H) 09/12/2024   TRIG 110.0 09/12/2024   CHOLHDL 3 09/12/2024    Continue weight loss therapy, losing 10% or more of body weight may improve condition. Also advised to reduce saturated fats in diet to less than 10% of daily calories.  She does not benefit from statin therapy at present time.

## 2024-10-14 NOTE — Assessment & Plan Note (Signed)
 Weight: decrease of 65.8 lb (27.1%) over 1 year, 5 months  Start: 05/01/2023 242 lb 12.8 oz (110.1 kg)  End: 10/14/2024 177 lb (80.3 kg)  Continued weight loss with a 4-pound reduction since the last visit. Following a 1200 calorie nutrition plan and exercising five days a week for 30 minutes. No significant challenges reported. Current BMI is 29, indicating a transition from obesity to overweight. Body fat percentage has decreased from 43% to between 34% and 37%. Visceral fat reduced from 12 to 7, with optimal being less than 10. No muscle loss observed. Current medication is Wegovy  1 mg weekly, with no significant side effects reported. Decision made to maintain current dose to avoid potential muscle loss from increased dosage. - Continue Wegovy  1 mg weekly with a three-month supply, refilled monthly. - Maintain current 1200 calorie nutrition plan. - Continue current exercise regimen, incorporating strengthening exercises. - Aim for 240 minutes of combined cardio and strength training weekly. - Monitor protein intake and consider tracking for better awareness. - Schedule follow-up in six weeks.

## 2024-10-14 NOTE — Progress Notes (Signed)
 Office: [phone]  /  Fax: [phone]  Weight Summary and Body Composition Analysis (BIA)  Vitals Temp: 98.4 F (36.9 C) BP: 124/82 Pulse Rate: 89 SpO2: 100 %   Anthropometric Measurements Height: 5' 5 (1.651 m) Weight: 177 lb (80.3 kg) BMI (Calculated): 29.45 Weight at Last Visit: 181 Weight Lost Since Last Visit: 4 lb Weight Gained Since Last Visit: 0 lb Starting Weight: 239 lb Total Weight Loss (lbs): 62 lb (28.1 kg) Peak Weight: 242 lb   Body Composition  Body Fat %: 37 % Fat Mass (lbs): 65.6 lbs Muscle Mass (lbs): 106.2 lbs Total Body Water (lbs): 7 lbs   The 10-year ASCVD risk score (Arnett DK, et al., 2019) is: 0.6%   RMR: 1858  Today's Visit #: 17  Starting Date: 06/12/23   Subjective   Chief Complaint: Obesity  Interval History Discussed the use of AI scribe software for clinical note transcription with the patient, who gave verbal consent to proceed.  History of Present Illness Stacey Santiago is a 43 year old female who presents for medical weight management.  She has been following a 1200 calorie nutrition plan and has lost four pounds since her last visit. She exercises five days a week for 30 minutes, incorporating both cardio and weight training, and feels improved in her physical activity.  She is currently on Wegovy  1 mg once a week and tolerates it well, with only mild constipation occasionally.  She was previously on Zepbound  at a dose of 5 mg. Her body composition measurements have shown fluctuations in body fat percentage, ranging from 34% to 37%.  Her cholesterol levels were slightly elevated during a recent physical, which she attributes to genetic factors, as both her parents have high cholesterol. Her blood pressure is 124/82 mmHg, and her A1c has increased slightly from 5.0% to 5.6%.     Challenges affecting patient progress: none.    Pharmacotherapy for weight management: She is currently taking Wegovy  with adequate clinical  response  and without side effects..   Assessment and Plan   Treatment Plan For Obesity:  Recommended Dietary Goals  Stacey Santiago is currently in the action stage of change. As such, her goal is to continue weight management plan. She has agreed to: continue current plan  Behavioral Health and Counseling  We discussed the following behavioral modification strategies today: continue to work on maintaining a reduced calorie state, getting the recommended amount of protein, incorporating whole foods, making healthy choices, staying well hydrated and practicing mindfulness when eating. and increase protein intake, fibrous foods (25 grams per day for women, 30 grams for men) and water to improve satiety and decrease hunger signals. .  Additional education and resources provided today: Handout on staying on track during the holidays  Recommended Physical Activity Goals  Stacey Santiago has been advised to work up to 150 minutes of moderate intensity aerobic activity a week and strengthening exercises 2-3 times per week for cardiovascular health, weight loss maintenance and preservation of muscle mass.  She has agreed to :  Increase volume of physical activity to a goal of 240 minutes a week and Combine aerobic and strengthening exercises for efficiency and improved cardiometabolic health.  Medical Interventions and Pharmacotherapy  We discussed various medication options to help Stacey Santiago with her weight loss efforts and we both agreed to : Adequate clinical response to anti-obesity medication, continue current regimen and do not recommend further increases in GLP-1 due to adequate clinical response   Associated Conditions Impacted by Obesity Treatment  Assessment & Plan Prediabetes A1c increased from 5.0 to 5.6, possibly due to medication transition. Cardiovascular risk is low at 0.6%, with no need for cholesterol-lowering medication. Blood pressure is well-controlled at 124/82. Emphasis on dietary management  to control carbohydrate intake and maintain a balanced diet. - Will repeat A1c in six months. - Monitor carbohydrate intake, aiming for 40% of calories from carbs. - Incorporate tracking of dietary intake to adjust macronutrient balance. - Continue low-fat dairy and reduce intake of saturated fats.  Generalized obesity with a starting BMI of 40 Weight: decrease of 65.8 lb (27.1%) over 1 year, 5 months  Start: 05/01/2023 242 lb 12.8 oz (110.1 kg)  End: 10/14/2024 177 lb (80.3 kg)  Continued weight loss with a 4-pound reduction since the last visit. Following a 1200 calorie nutrition plan and exercising five days a week for 30 minutes. No significant challenges reported. Current BMI is 29, indicating a transition from obesity to overweight. Body fat percentage has decreased from 43% to between 34% and 37%. Visceral fat reduced from 12 to 7, with optimal being less than 10. No muscle loss observed. Current medication is Wegovy  1 mg weekly, with no significant side effects reported. Decision made to maintain current dose to avoid potential muscle loss from increased dosage. - Continue Wegovy  1 mg weekly with a three-month supply, refilled monthly. - Maintain current 1200 calorie nutrition plan. - Continue current exercise regimen, incorporating strengthening exercises. - Aim for 240 minutes of combined cardio and strength training weekly. - Monitor protein intake and consider tracking for better awareness. - Schedule follow-up in six weeks. BMI 29.0-29.9,adult  Abnormal food appetite Improved on Wegovy  She has increased orexigenic signaling, impaired satiety and inhibitory control. This is secondary to an abnormal energy regulation system and pathological neurohormonal pathways characteristic of excess adiposity.  In addition to nutritional and behavioral strategies she benefits from pharmacotherapy GLP-1.   Mixed hyperlipidemia LDL is not at goal. Elevated LDL may be secondary to nutrition, genetics  and spillover effect from excess adiposity. Recommended LDL goal is <70 to reduce the risk of fatty streaks and the progression to obstructive ASCVD in the future.   Her 10 year risk is: The 10-year ASCVD risk score (Arnett DK, et al., 2019) is: 0.6%  Lab Results  Component Value Date   CHOL 224 (H) 09/12/2024   HDL 67.80 09/12/2024   LDLCALC 134 (H) 09/12/2024   TRIG 110.0 09/12/2024   CHOLHDL 3 09/12/2024    Continue weight loss therapy, losing 10% or more of body weight may improve condition. Also advised to reduce saturated fats in diet to less than 10% of daily calories.  She does not benefit from statin therapy at present time.          Objective   Physical Exam:  Blood pressure 124/82, pulse 89, temperature 98.4 F (36.9 C), height 5' 5 (1.651 m), weight 177 lb (80.3 kg), last menstrual period 09/18/2024, SpO2 100%. Body mass index is 29.45 kg/m.  General: She is overweight, cooperative, alert, well developed, and in no acute distress. PSYCH: Has normal mood, affect and thought process.   HEENT: EOMI, sclerae are anicteric. Lungs: Normal breathing effort, no conversational dyspnea. Extremities: No edema.  Neurologic: No gross sensory or motor deficits. No tremors or fasciculations noted.    Diagnostic Data Reviewed:  BMET    Component Value Date/Time   NA 141 09/12/2024 1044   NA 141 06/30/2021 1036   K 4.8 09/12/2024 1044   CL 107 09/12/2024  1044   CO2 24 09/12/2024 1044   GLUCOSE 80 09/12/2024 1044   BUN 11 09/12/2024 1044   BUN 9 06/30/2021 1036   CREATININE 1.00 09/12/2024 1044   CALCIUM 9.7 09/12/2024 1044   GFRNONAA 76 10/20/2020 0937   GFRAA 87 10/20/2020 0937   Lab Results  Component Value Date   HGBA1C 5.6 09/12/2024   HGBA1C 5.1 05/16/2017   Lab Results  Component Value Date   INSULIN  12.3 02/12/2024   INSULIN  14.8 06/12/2023   Lab Results  Component Value Date   TSH 1.41 09/12/2024   CBC    Component Value Date/Time   WBC 6.8  09/12/2024 1044   RBC 4.44 09/12/2024 1044   HGB 12.0 09/12/2024 1044   HGB 12.3 06/30/2021 1036   HCT 37.2 09/12/2024 1044   HCT 36.7 06/30/2021 1036   PLT 428.0 (H) 09/12/2024 1044   PLT 334 06/30/2021 1036   MCV 83.7 09/12/2024 1044   MCV 87 06/30/2021 1036   MCH 29.1 06/30/2021 1036   MCH 31.4 06/10/2016 0601   MCHC 32.4 09/12/2024 1044   RDW 16.0 (H) 09/12/2024 1044   RDW 13.2 06/30/2021 1036   Iron Studies    Component Value Date/Time   IRON 70 02/12/2024 1116   TIBC 435 02/12/2024 1116   FERRITIN 28 02/12/2024 1116   IRONPCTSAT 16 02/12/2024 1116   Lipid Panel     Component Value Date/Time   CHOL 224 (H) 09/12/2024 1044   CHOL 227 (H) 06/30/2021 1036   TRIG 110.0 09/12/2024 1044   HDL 67.80 09/12/2024 1044   HDL 69 06/30/2021 1036   CHOLHDL 3 09/12/2024 1044   VLDL 22.0 09/12/2024 1044   LDLCALC 134 (H) 09/12/2024 1044   LDLCALC 143 (H) 06/30/2021 1036   Hepatic Function Panel     Component Value Date/Time   PROT 6.7 04/21/2022 0815   PROT 6.9 06/30/2021 1036   ALBUMIN 3.9 04/21/2022 0815   ALBUMIN 4.3 06/30/2021 1036   AST 13 04/21/2022 0815   ALT 10 04/21/2022 0815   ALKPHOS 62 04/21/2022 0815   BILITOT 0.3 04/21/2022 0815   BILITOT 0.2 06/30/2021 1036      Component Value Date/Time   TSH 1.41 09/12/2024 1044   Nutritional Lab Results  Component Value Date   VD25OH 36.47 09/12/2024   VD25OH 40.23 05/01/2023   VD25OH 45.21 10/23/2022    Medications: Outpatient Encounter Medications as of 10/14/2024  Medication Sig   cyanocobalamin (VITAMIN B12) 1000 MCG tablet Take 1 tablet (1,000 mcg total) by mouth every other day.   desogestrel -ethinyl estradiol  (APRI ) 0.15-30 MG-MCG tablet Take 1 tablet by mouth daily.   EPINEPHrine  0.3 mg/0.3 mL IJ SOAJ injection Inject 0.3 mg into the muscle as needed for anaphylaxis.   escitalopram  (LEXAPRO ) 10 MG tablet Take 1 tablet (10 mg total) by mouth daily. Please schedule office visit before any future refill.    Multiple Vitamins-Minerals (MULTIVITAMIN WITH MINERALS) tablet Take 1 tablet by mouth daily.   [DISCONTINUED] semaglutide -weight management (WEGOVY ) 1 MG/0.5ML SOAJ SQ injection Inject 1 mg into the skin once a week.   semaglutide -weight management (WEGOVY ) 1 MG/0.5ML SOAJ SQ injection Inject 1 mg into the skin once a week.   No facility-administered encounter medications on file as of 10/14/2024.     Follow-Up   Return in about 6 weeks (around 11/25/2024) for For Weight Mangement with Dr. [email or web address] She was informed of the importance of frequent follow up visits to maximize her success with intensive lifestyle  modifications for her multiple health conditions.  Attestation Statement   Reviewed by clinician on day of visit: allergies, medications, problem list, medical history, surgical history, family history, social history, and previous encounter notes.     Lucas Parker, MD

## 2024-11-11 ENCOUNTER — Ambulatory Visit (INDEPENDENT_AMBULATORY_CARE_PROVIDER_SITE_OTHER): Payer: Self-pay | Admitting: Internal Medicine

## 2024-11-25 ENCOUNTER — Encounter (INDEPENDENT_AMBULATORY_CARE_PROVIDER_SITE_OTHER): Payer: Self-pay | Admitting: Internal Medicine

## 2024-11-25 ENCOUNTER — Ambulatory Visit (INDEPENDENT_AMBULATORY_CARE_PROVIDER_SITE_OTHER): Admitting: Internal Medicine

## 2024-11-25 VITALS — BP 126/80 | HR 80 | Temp 98.7°F | Ht 65.0 in | Wt 172.0 lb

## 2024-11-25 DIAGNOSIS — R7303 Prediabetes: Secondary | ICD-10-CM

## 2024-11-25 DIAGNOSIS — R638 Other symptoms and signs concerning food and fluid intake: Secondary | ICD-10-CM

## 2024-11-25 DIAGNOSIS — E669 Obesity, unspecified: Secondary | ICD-10-CM

## 2024-11-25 MED ORDER — WEGOVY 1 MG/0.5ML ~~LOC~~ SOAJ: 1.0000 mg | SUBCUTANEOUS | 2 refills | Status: AC

## 2024-11-25 NOTE — Assessment & Plan Note (Signed)
 Weight: decrease of 70.8 lb (29.2%) over 1 year, 6 months  Start: 05/01/2023 242 lb 12.8 oz (110.1 kg)  End: 11/25/2024 172 lb (78 kg)  Well-managed with Wegovy  at 1 mg, resulting in continued weight loss without significant side effects. She reports increased hunger compared to previous medication, Zepbound , but no adverse effects. Current weight loss rate is rapid, with a potential risk of muscle loss. Hand grip strength measured using a dynameter and is within normal range She is on a 1300 calorie diet with a 500 calorie deficit, maintaining a weight loss of approximately 1 pound per week.   - Continue Wegovy  at 1 mg with two refills. - Increase protein intake to 120 grams per day, approximately 40 grams per meal. - Focus on strengthening exercises to preserve muscle mass. - Adjust exercise routine to include heavier weights and progressive overload. - Maintain a 1300 calorie diet with a 500 calorie deficit. - Scheduled follow-up appointment in six weeks. -Prediabetes has improved continue Wegovy  for pharmacoprevention

## 2024-11-25 NOTE — Progress Notes (Signed)
 Office: [phone]  /  Fax: [phone]  Weight Summary and Body Composition Analysis (BIA)  Vitals Temp: 98.7 F (37.1 C) BP: 126/80 Pulse Rate: 80 SpO2: 99 %   Anthropometric Measurements Height: 5' 5 (1.651 m) Weight: 172 lb (78 kg) BMI (Calculated): 28.62 Weight at Last Visit: 177 lb Weight Lost Since Last Visit: 5 lb Weight Gained Since Last Visit: 0 lb Starting Weight: 239 lb Total Weight Loss (lbs): 68 lb (30.8 kg) Peak Weight: 242 lb   Body Composition  Body Fat %: 35.8 % Fat Mass (lbs): 61.6 lbs Muscle Mass (lbs): 104.8 lbs Total Body Water (lbs): 73.2 lbs Visceral Fat Rating : 7    RMR: 1858  Today's Visit #: 18  Starting Date: 06/12/23   Subjective   Chief Complaint: Obesity  Interval History  Discussed the use of AI scribe software for clinical note transcription with the patient, who gave verbal consent to proceed.  History of Present Illness Stacey Santiago is a 43 year old female who presents for a follow-up on weight management.  Since last office visit she has lost 5 pounds.  She is following a 1200-calorie nutrition plan 80% of the time.  She is exercising 7 days a week 30 to 45 minutes cardio and strengthening.  She is actively working on weight loss and muscle maintenance, aiming to reach a weight of 150 pounds, which she associates with her weight after college. She reports ongoing weight loss and is concerned about maintaining muscle mass while continuing to lose weight. She is concerned about maintaining muscle mass while continuing to lose weight.  She is currently on Wegovy  at a dose of 1 mg and continues to lose weight without significant side effects. She notes feeling her hunger more on Wegovy  compared to Zepbound , which she previously used, but considers this a positive as it reminds her to eat. She is comfortable with her current dosage and does not feel the need to increase it at this time.  Her dietary intake includes a protein  goal of 100 grams per day, although she sometimes falls short. She is considering increasing her protein intake to 120 grams per day to help with muscle maintenance and hunger management. She maintains a caloric intake of approximately 1300 calories per day, which she believes contributes to her weight loss.  In terms of physical activity, she engages in regular exercise, including weight training. She uses 15-pound weights and is considering increasing to 20-pound weights as the current weight is becoming easier. She performs 8 sets of 10 repetitions for her exercises and is considering adjusting her routine to include heavier weights with fewer repetitions to build muscle mass.  She has experienced some hair loss, which she attributes to weight loss, but notes it is not very noticeable due to her thick hair.  She believes her cholesterol levels are high due to genetics.     Challenges affecting patient progress: none.    Pharmacotherapy for weight management: She is currently taking Wegovy  with adequate clinical response  and without side effects..   Assessment and Plan   Treatment Plan For Obesity:  Recommended Dietary Goals  Stacey Santiago is currently in the action stage of change. As such, her goal is to continue weight management plan. She has agreed to: continue current plan  Behavioral Health and Counseling  We discussed the following behavioral modification strategies today: continue to work on maintaining a reduced calorie state, getting the recommended amount of protein, incorporating whole foods, making healthy  choices, staying well hydrated and practicing mindfulness when eating. and increase protein intake, fibrous foods (25 grams per day for women, 30 grams for men) and water to improve satiety and decrease hunger signals. .  Additional education and resources provided today: Handout on traveling and holiday eating strategies  Recommended Physical Activity Goals  Stacey Santiago has been  advised to work up to 150 minutes of moderate intensity aerobic activity a week and strengthening exercises 2-3 times per week for cardiovascular health, weight loss maintenance and preservation of muscle mass.  She has agreed to :  Increase the intensity, frequency or duration of strengthening exercises   Medical Interventions and Pharmacotherapy  We discussed various medication options to help Stacey Santiago with her weight loss efforts and we both agreed to : Adequate clinical response to anti-obesity medication, continue current regimen, do not recommend further increases in GLP-1 due to decreases in muscle mass, and Patient was counseled on the importance of maintaining healthy lifestyle habits, including balanced nutrition, regular physical activity, and behavioral modifications, while taking antiobesity medication.  Patient verbalized understanding that medication is an adjunct to, not a replacement for, lifestyle changes and that the long-term success and weight maintenance depend on continued adherence to these strategies.  Associated Conditions Impacted by Obesity Treatment  Assessment & Plan Abnormal food appetite Prediabetes Generalized obesity with a starting BMI of 40 Weight: decrease of 70.8 lb (29.2%) over 1 year, 6 months  Start: 05/01/2023 242 lb 12.8 oz (110.1 kg)  End: 11/25/2024 172 lb (78 kg)  Well-managed with Wegovy  at 1 mg, resulting in continued weight loss without significant side effects. She reports increased hunger compared to previous medication, Zepbound , but no adverse effects. Current weight loss rate is rapid, with a potential risk of muscle loss. Hand grip strength measured using a dynameter and is within normal range She is on a 1300 calorie diet with a 500 calorie deficit, maintaining a weight loss of approximately 1 pound per week.   - Continue Wegovy  at 1 mg with two refills. - Increase protein intake to 120 grams per day, approximately 40 grams per meal. - Focus on  strengthening exercises to preserve muscle mass. - Adjust exercise routine to include heavier weights and progressive overload. - Maintain a 1300 calorie diet with a 500 calorie deficit. - Scheduled follow-up appointment in six weeks. -Prediabetes has improved continue Wegovy  for pharmacoprevention          Objective   Physical Exam:  Blood pressure 126/80, pulse 80, temperature 98.7 F (37.1 C), height 5' 5 (1.651 m), weight 172 lb (78 kg), SpO2 99%. Body mass index is 28.62 kg/m.  General: She is overweight, cooperative, alert, well developed, and in no acute distress. PSYCH: Has normal mood, affect and thought process.   HEENT: EOMI, sclerae are anicteric. Lungs: Normal breathing effort, no conversational dyspnea. Extremities: No edema.  Neurologic: No gross sensory or motor deficits. No tremors or fasciculations noted.    Diagnostic Data Reviewed:  BMET    Component Value Date/Time   NA 141 09/12/2024 1044   NA 141 06/30/2021 1036   K 4.8 09/12/2024 1044   CL 107 09/12/2024 1044   CO2 24 09/12/2024 1044   GLUCOSE 80 09/12/2024 1044   BUN 11 09/12/2024 1044   BUN 9 06/30/2021 1036   CREATININE 1.00 09/12/2024 1044   CALCIUM 9.7 09/12/2024 1044   GFRNONAA 76 10/20/2020 0937   GFRAA 87 10/20/2020 0937   Lab Results  Component Value Date   HGBA1C  5.6 09/12/2024   HGBA1C 5.1 05/16/2017   Lab Results  Component Value Date   INSULIN  12.3 02/12/2024   INSULIN  14.8 06/12/2023   Lab Results  Component Value Date   TSH 1.41 09/12/2024   CBC    Component Value Date/Time   WBC 6.8 09/12/2024 1044   RBC 4.44 09/12/2024 1044   HGB 12.0 09/12/2024 1044   HGB 12.3 06/30/2021 1036   HCT 37.2 09/12/2024 1044   HCT 36.7 06/30/2021 1036   PLT 428.0 (H) 09/12/2024 1044   PLT 334 06/30/2021 1036   MCV 83.7 09/12/2024 1044   MCV 87 06/30/2021 1036   MCH 29.1 06/30/2021 1036   MCH 31.4 06/10/2016 0601   MCHC 32.4 09/12/2024 1044   RDW 16.0 (H) 09/12/2024 1044    RDW 13.2 06/30/2021 1036   Iron Studies    Component Value Date/Time   IRON 70 02/12/2024 1116   TIBC 435 02/12/2024 1116   FERRITIN 28 02/12/2024 1116   IRONPCTSAT 16 02/12/2024 1116   Lipid Panel     Component Value Date/Time   CHOL 224 (H) 09/12/2024 1044   CHOL 227 (H) 06/30/2021 1036   TRIG 110.0 09/12/2024 1044   HDL 67.80 09/12/2024 1044   HDL 69 06/30/2021 1036   CHOLHDL 3 09/12/2024 1044   VLDL 22.0 09/12/2024 1044   LDLCALC 134 (H) 09/12/2024 1044   LDLCALC 143 (H) 06/30/2021 1036   Hepatic Function Panel     Component Value Date/Time   PROT 6.7 04/21/2022 0815   PROT 6.9 06/30/2021 1036   ALBUMIN 3.9 04/21/2022 0815   ALBUMIN 4.3 06/30/2021 1036   AST 13 04/21/2022 0815   ALT 10 04/21/2022 0815   ALKPHOS 62 04/21/2022 0815   BILITOT 0.3 04/21/2022 0815   BILITOT 0.2 06/30/2021 1036      Component Value Date/Time   TSH 1.41 09/12/2024 1044   Nutritional Lab Results  Component Value Date   VD25OH 36.47 09/12/2024   VD25OH 40.23 05/01/2023   VD25OH 45.21 10/23/2022    Medications: Outpatient Encounter Medications as of 11/25/2024  Medication Sig   cyanocobalamin  (VITAMIN B12) 1000 MCG tablet Take 1 tablet (1,000 mcg total) by mouth every other day.   desogestrel -ethinyl estradiol  (APRI ) 0.15-30 MG-MCG tablet Take 1 tablet by mouth daily.   EPINEPHrine  0.3 mg/0.3 mL IJ SOAJ injection Inject 0.3 mg into the muscle as needed for anaphylaxis.   escitalopram  (LEXAPRO ) 10 MG tablet Take 1 tablet (10 mg total) by mouth daily. Please schedule office visit before any future refill.   Multiple Vitamins-Minerals (MULTIVITAMIN WITH MINERALS) tablet Take 1 tablet by mouth daily.   semaglutide -weight management (WEGOVY ) 1 MG/0.5ML SOAJ SQ injection Inject 1 mg into the skin once a week.   No facility-administered encounter medications on file as of 11/25/2024.     Follow-Up   No follow-ups on file.SABRA She was informed of the importance of frequent follow up  visits to maximize her success with intensive lifestyle modifications for her multiple health conditions.  Attestation Statement   Reviewed by clinician on day of visit: allergies, medications, problem list, medical history, surgical history, family history, social history, and previous encounter notes.     Lucas Parker, MD

## 2024-11-25 NOTE — Progress Notes (Deleted)
 Stacey Parker, MD [facility], ABOM  [address] [place], [place], [place] [address] Office: [phone]  /  Fax: [phone]    Weight Summary and Body Composition Analysis (BIA)  Vitals Temp: 98.7 F (37.1 C) BP: 126/80 Pulse Rate: 80 SpO2: 99 %   Anthropometric Measurements Height: 5' 5 (1.651 m) Weight: 172 lb (78 kg) BMI (Calculated): 28.62 Weight at Last Visit: 177 lb Weight Lost Since Last Visit: 5 lb Weight Gained Since Last Visit: 0 lb Starting Weight: 239 lb Total Weight Loss (lbs): 68 lb (30.8 kg) Peak Weight: 242 lb   Body Composition  Body Fat %: 35.8 % Fat Mass (lbs): 61.6 lbs Muscle Mass (lbs): 104.8 lbs Total Body Water (lbs): 73.2 lbs Visceral Fat Rating : 7    RMR: 1858  Today's Visit #: 18  Starting Date: 06/12/23   Subjective   Chief Complaint: Obesity  Interval History  Stacey Santiago is here to discuss her progress with her obesity treatment plan. She is following {emwtlossplannewint:31639} and states she is following her eating plan approximately {empercentages:32441}% of the time. She states she is {emyesnoexercise:32811}.  [] Denies [] Reports []  Improved problems with appetite and hunger signals.  [] Denies [] Reports []  Improved problems with satiety and satiation.  [] Denies [] Reports []  Improved problems with eating patterns and portion control.  [] Denies [] Reports []  Improved abnormal cravings  Discussed the use of AI scribe software for clinical note transcription with the patient, who gave verbal consent to proceed.  History of Present Illness        Challenges affecting patient progress: {EMOBESITYBARRIERS:28841::none}.    Pharmacotherapy for weight management: She is currently taking {EMPharmaco:28845}.   Assessment and Plan   Treatment Plan For Obesity:  Recommended Dietary Goals  Stacey Santiago is currently in the action stage of change. As such, her goal is to continue weight management plan. She has agreed to:  {EMWTLOSSPLAN:29297::continue current plan}  Behavioral Health and Counseling  We discussed the following behavioral modification strategies today: {EMWMwtlossstrategies:28914::continue to work on maintaining a reduced calorie state, getting the recommended amount of protein, incorporating whole foods, making healthy choices, staying well hydrated and practicing mindfulness when eating.,increase protein intake, fibrous foods (25 grams per day for women, 30 grams for men) and water to improve satiety and decrease hunger signals. }.  Additional education and resources provided today: {EMadditionalresources:29169::None}  Recommended Physical Activity Goals  Stacey Santiago has been advised to work up to 150 minutes of moderate intensity aerobic activity a week and strengthening exercises 2-3 times per week for cardiovascular health, weight loss maintenance and preservation of muscle mass.  She has agreed to :  {EMEXERCISE:28847::Think about enjoyable ways to increase daily physical activity and overcoming barriers to exercise,Increase physical activity in their day and reduce sedentary time (increase NEAT).,Increase volume of physical activity to a goal of 240 minutes a week,Combine aerobic and strengthening exercises for efficiency and improved cardiometabolic health.}  Medical Interventions and Pharmacotherapy  We have reviewed current or available treatment options to assist her with their weight loss efforts and we agreed to : {EMagreedrx:29170}  Associated Conditions Impacted by Obesity Treatment  Assessment & Plan     Assessment and Plan Assessment & Plan       No orders of the defined types were placed in this encounter.    Objective   Physical Exam:  Blood pressure 126/80, pulse 80, temperature 98.7 F (37.1 C), height 5' 5 (1.651 m), weight 172 lb (78 kg), SpO2 99%. Body mass index is 28.62 kg/m.  General: She is  overweight, cooperative, alert, well  developed, and in no acute distress. PSYCH: Has normal mood, affect and thought process.   HEENT: EOMI, sclerae are anicteric. Lungs: Normal breathing effort, no conversational dyspnea. Extremities: No edema.  Neurologic: No gross sensory or motor deficits. No tremors or fasciculations noted.    Diagnostic Data Reviewed:  BMET    Component Value Date/Time   NA 141 09/12/2024 1044   NA 141 06/30/2021 1036   K 4.8 09/12/2024 1044   CL 107 09/12/2024 1044   CO2 24 09/12/2024 1044   GLUCOSE 80 09/12/2024 1044   BUN 11 09/12/2024 1044   BUN 9 06/30/2021 1036   CREATININE 1.00 09/12/2024 1044   CALCIUM 9.7 09/12/2024 1044   GFRNONAA 76 10/20/2020 0937   GFRAA 87 10/20/2020 0937   Lab Results  Component Value Date   HGBA1C 5.6 09/12/2024   HGBA1C 5.1 05/16/2017   Lab Results  Component Value Date   INSULIN  12.3 02/12/2024   INSULIN  14.8 06/12/2023   Lab Results  Component Value Date   TSH 1.41 09/12/2024   CBC    Component Value Date/Time   WBC 6.8 09/12/2024 1044   RBC 4.44 09/12/2024 1044   HGB 12.0 09/12/2024 1044   HGB 12.3 06/30/2021 1036   HCT 37.2 09/12/2024 1044   HCT 36.7 06/30/2021 1036   PLT 428.0 (H) 09/12/2024 1044   PLT 334 06/30/2021 1036   MCV 83.7 09/12/2024 1044   MCV 87 06/30/2021 1036   MCH 29.1 06/30/2021 1036   MCH 31.4 06/10/2016 0601   MCHC 32.4 09/12/2024 1044   RDW 16.0 (H) 09/12/2024 1044   RDW 13.2 06/30/2021 1036   Iron Studies    Component Value Date/Time   IRON 70 02/12/2024 1116   TIBC 435 02/12/2024 1116   FERRITIN 28 02/12/2024 1116   IRONPCTSAT 16 02/12/2024 1116   Lipid Panel     Component Value Date/Time   CHOL 224 (H) 09/12/2024 1044   CHOL 227 (H) 06/30/2021 1036   TRIG 110.0 09/12/2024 1044   HDL 67.80 09/12/2024 1044   HDL 69 06/30/2021 1036   CHOLHDL 3 09/12/2024 1044   VLDL 22.0 09/12/2024 1044   LDLCALC 134 (H) 09/12/2024 1044   LDLCALC 143 (H) 06/30/2021 1036   Hepatic Function Panel     Component  Value Date/Time   PROT 6.7 04/21/2022 0815   PROT 6.9 06/30/2021 1036   ALBUMIN 3.9 04/21/2022 0815   ALBUMIN 4.3 06/30/2021 1036   AST 13 04/21/2022 0815   ALT 10 04/21/2022 0815   ALKPHOS 62 04/21/2022 0815   BILITOT 0.3 04/21/2022 0815   BILITOT 0.2 06/30/2021 1036      Component Value Date/Time   TSH 1.41 09/12/2024 1044   Nutritional Lab Results  Component Value Date   VD25OH 36.47 09/12/2024   VD25OH 40.23 05/01/2023   VD25OH 45.21 10/23/2022    Medications: Outpatient Encounter Medications as of 11/25/2024  Medication Sig   cyanocobalamin  (VITAMIN B12) 1000 MCG tablet Take 1 tablet (1,000 mcg total) by mouth every other day.   desogestrel -ethinyl estradiol  (APRI ) 0.15-30 MG-MCG tablet Take 1 tablet by mouth daily.   EPINEPHrine  0.3 mg/0.3 mL IJ SOAJ injection Inject 0.3 mg into the muscle as needed for anaphylaxis.   escitalopram  (LEXAPRO ) 10 MG tablet Take 1 tablet (10 mg total) by mouth daily. Please schedule office visit before any future refill.   Multiple Vitamins-Minerals (MULTIVITAMIN WITH MINERALS) tablet Take 1 tablet by mouth daily.   semaglutide -weight management (  WEGOVY ) 1 MG/0.5ML SOAJ SQ injection Inject 1 mg into the skin once a week.   No facility-administered encounter medications on file as of 11/25/2024.     Follow-Up   No follow-ups on file.SABRA She was informed of the importance of frequent follow up visits to maximize her success with intensive lifestyle modifications for her multiple health conditions.  Attestation Statement   Reviewed by clinician on day of visit: allergies, medications, problem list, medical history, surgical history, family history, social history, and previous encounter notes.     Stacey Parker, MD [facility], [place]

## 2025-01-06 ENCOUNTER — Ambulatory Visit (INDEPENDENT_AMBULATORY_CARE_PROVIDER_SITE_OTHER): Admitting: Internal Medicine

## 2025-01-19 ENCOUNTER — Ambulatory Visit (INDEPENDENT_AMBULATORY_CARE_PROVIDER_SITE_OTHER): Admitting: Internal Medicine
